# Patient Record
Sex: Female | Born: 1975 | Race: Black or African American | Hispanic: No | Smoking: Current some day smoker
Health system: Southern US, Community
[De-identification: ages and names within clinical notes are randomized; demographics above are authoritative.]

## PROBLEM LIST (undated history)

## (undated) DIAGNOSIS — F32A Depression, unspecified: Secondary | ICD-10-CM

## (undated) DIAGNOSIS — T8859XA Other complications of anesthesia, initial encounter: Secondary | ICD-10-CM

## (undated) DIAGNOSIS — F419 Anxiety disorder, unspecified: Secondary | ICD-10-CM

## (undated) DIAGNOSIS — R112 Nausea with vomiting, unspecified: Secondary | ICD-10-CM

## (undated) DIAGNOSIS — R51 Headache: Secondary | ICD-10-CM

## (undated) DIAGNOSIS — F329 Major depressive disorder, single episode, unspecified: Secondary | ICD-10-CM

## (undated) DIAGNOSIS — T4145XA Adverse effect of unspecified anesthetic, initial encounter: Secondary | ICD-10-CM

## (undated) DIAGNOSIS — Z9889 Other specified postprocedural states: Secondary | ICD-10-CM

## (undated) DIAGNOSIS — R519 Headache, unspecified: Secondary | ICD-10-CM

## (undated) DIAGNOSIS — Z8489 Family history of other specified conditions: Secondary | ICD-10-CM

## (undated) DIAGNOSIS — D649 Anemia, unspecified: Secondary | ICD-10-CM

## (undated) HISTORY — PX: OTHER SURGICAL HISTORY: SHX169

## (undated) HISTORY — PX: HERNIA REPAIR: SHX51

## (undated) HISTORY — PX: DILATION AND CURETTAGE OF UTERUS: SHX78

---

## 1999-02-11 ENCOUNTER — Emergency Department (HOSPITAL_COMMUNITY): Admission: EM | Admit: 1999-02-11 | Discharge: 1999-02-11 | Payer: Self-pay | Admitting: Emergency Medicine

## 1999-11-25 ENCOUNTER — Other Ambulatory Visit: Admission: RE | Admit: 1999-11-25 | Discharge: 1999-11-25 | Payer: Self-pay | Admitting: Obstetrics and Gynecology

## 2000-12-23 ENCOUNTER — Emergency Department (HOSPITAL_COMMUNITY): Admission: EM | Admit: 2000-12-23 | Discharge: 2000-12-23 | Payer: Self-pay

## 2001-01-25 ENCOUNTER — Encounter: Admission: RE | Admit: 2001-01-25 | Discharge: 2001-01-25 | Payer: Self-pay | Admitting: Obstetrics & Gynecology

## 2001-02-07 ENCOUNTER — Encounter: Payer: Self-pay | Admitting: Obstetrics & Gynecology

## 2001-02-07 ENCOUNTER — Ambulatory Visit (HOSPITAL_COMMUNITY): Admission: RE | Admit: 2001-02-07 | Discharge: 2001-02-07 | Payer: Self-pay | Admitting: Obstetrics & Gynecology

## 2001-03-08 ENCOUNTER — Encounter: Admission: RE | Admit: 2001-03-08 | Discharge: 2001-03-08 | Payer: Self-pay | Admitting: Obstetrics & Gynecology

## 2001-06-20 ENCOUNTER — Emergency Department (HOSPITAL_COMMUNITY): Admission: EM | Admit: 2001-06-20 | Discharge: 2001-06-20 | Payer: Self-pay | Admitting: Emergency Medicine

## 2001-06-24 ENCOUNTER — Emergency Department (HOSPITAL_COMMUNITY): Admission: EM | Admit: 2001-06-24 | Discharge: 2001-06-24 | Payer: Self-pay | Admitting: Emergency Medicine

## 2002-10-20 ENCOUNTER — Other Ambulatory Visit: Admission: RE | Admit: 2002-10-20 | Discharge: 2002-10-20 | Payer: Self-pay | Admitting: *Deleted

## 2004-01-30 ENCOUNTER — Encounter: Admission: RE | Admit: 2004-01-30 | Discharge: 2004-01-30 | Payer: Self-pay | Admitting: Internal Medicine

## 2005-04-10 ENCOUNTER — Emergency Department (HOSPITAL_COMMUNITY): Admission: EM | Admit: 2005-04-10 | Discharge: 2005-04-10 | Payer: Self-pay | Admitting: Emergency Medicine

## 2005-04-14 ENCOUNTER — Emergency Department (HOSPITAL_COMMUNITY): Admission: EM | Admit: 2005-04-14 | Discharge: 2005-04-14 | Payer: Self-pay | Admitting: Family Medicine

## 2005-06-10 ENCOUNTER — Emergency Department (HOSPITAL_COMMUNITY): Admission: EM | Admit: 2005-06-10 | Discharge: 2005-06-11 | Payer: Self-pay | Admitting: Emergency Medicine

## 2005-07-08 ENCOUNTER — Ambulatory Visit (HOSPITAL_COMMUNITY): Admission: RE | Admit: 2005-07-08 | Discharge: 2005-07-08 | Payer: Self-pay | Admitting: Specialist

## 2006-11-28 ENCOUNTER — Emergency Department (HOSPITAL_COMMUNITY): Admission: EM | Admit: 2006-11-28 | Discharge: 2006-11-29 | Payer: Self-pay | Admitting: Emergency Medicine

## 2007-10-28 ENCOUNTER — Emergency Department (HOSPITAL_COMMUNITY): Admission: EM | Admit: 2007-10-28 | Discharge: 2007-10-28 | Payer: Self-pay | Admitting: Emergency Medicine

## 2008-01-10 ENCOUNTER — Ambulatory Visit: Payer: Self-pay | Admitting: Family Medicine

## 2008-01-10 LAB — CONVERTED CEMR LAB: Microalb, Ur: 0.35 mg/dL (ref 0.00–1.89)

## 2008-01-13 ENCOUNTER — Ambulatory Visit: Payer: Self-pay | Admitting: Internal Medicine

## 2008-01-16 ENCOUNTER — Ambulatory Visit (HOSPITAL_COMMUNITY): Admission: RE | Admit: 2008-01-16 | Discharge: 2008-01-16 | Payer: Self-pay | Admitting: Family Medicine

## 2008-01-18 ENCOUNTER — Emergency Department (HOSPITAL_COMMUNITY): Admission: EM | Admit: 2008-01-18 | Discharge: 2008-01-18 | Payer: Self-pay | Admitting: Family Medicine

## 2008-01-25 ENCOUNTER — Ambulatory Visit: Payer: Self-pay | Admitting: *Deleted

## 2008-02-28 ENCOUNTER — Encounter: Payer: Self-pay | Admitting: Internal Medicine

## 2008-02-28 ENCOUNTER — Ambulatory Visit: Payer: Self-pay | Admitting: Internal Medicine

## 2008-03-13 ENCOUNTER — Ambulatory Visit: Payer: Self-pay | Admitting: Internal Medicine

## 2008-03-13 ENCOUNTER — Encounter (INDEPENDENT_AMBULATORY_CARE_PROVIDER_SITE_OTHER): Payer: Self-pay | Admitting: Family Medicine

## 2008-03-13 LAB — CONVERTED CEMR LAB
AST: 13 units/L (ref 0–37)
Albumin: 4.2 g/dL (ref 3.5–5.2)
BUN: 14 mg/dL (ref 6–23)
Calcium: 9 mg/dL (ref 8.4–10.5)
Chloride: 103 meq/L (ref 96–112)
Glucose, Bld: 282 mg/dL — ABNORMAL HIGH (ref 70–99)
Potassium: 3.6 meq/L (ref 3.5–5.3)
TSH: 2.227 microintl units/mL (ref 0.350–4.50)
Total CHOL/HDL Ratio: 3.9
Total Protein: 7.1 g/dL (ref 6.0–8.3)
Triglycerides: 205 mg/dL — ABNORMAL HIGH (ref <150)
VLDL: 41 mg/dL — ABNORMAL HIGH (ref 0–40)

## 2008-04-06 HISTORY — PX: OTHER SURGICAL HISTORY: SHX169

## 2008-05-31 ENCOUNTER — Ambulatory Visit (HOSPITAL_COMMUNITY): Admission: RE | Admit: 2008-05-31 | Discharge: 2008-05-31 | Payer: Self-pay | Admitting: Obstetrics and Gynecology

## 2008-06-28 ENCOUNTER — Ambulatory Visit (HOSPITAL_COMMUNITY): Admission: RE | Admit: 2008-06-28 | Discharge: 2008-06-28 | Payer: Self-pay | Admitting: Obstetrics and Gynecology

## 2008-07-13 ENCOUNTER — Ambulatory Visit: Payer: Self-pay | Admitting: Family Medicine

## 2008-07-13 LAB — CONVERTED CEMR LAB
ALT: 22 units/L (ref 0–35)
AST: 18 units/L (ref 0–37)
Albumin: 4.4 g/dL (ref 3.5–5.2)
Alkaline Phosphatase: 43 units/L (ref 39–117)
Basophils Absolute: 0 10*3/uL (ref 0.0–0.1)
Basophils Relative: 1 % (ref 0–1)
Chloride: 103 meq/L (ref 96–112)
Cholesterol: 173 mg/dL (ref 0–200)
Glucose, Bld: 104 mg/dL — ABNORMAL HIGH (ref 70–99)
HCT: 38.2 % (ref 36.0–46.0)
Hemoglobin: 11.4 g/dL — ABNORMAL LOW (ref 12.0–15.0)
MCHC: 29.8 g/dL — ABNORMAL LOW (ref 30.0–36.0)
Microalb, Ur: 0.5 mg/dL (ref 0.00–1.89)
Monocytes Absolute: 0.2 10*3/uL (ref 0.1–1.0)
RBC: 5.31 M/uL — ABNORMAL HIGH (ref 3.87–5.11)
RDW: 26.8 % — ABNORMAL HIGH (ref 11.5–15.5)
Total CHOL/HDL Ratio: 4
VLDL: 36 mg/dL (ref 0–40)
WBC: 5.7 10*3/uL (ref 4.0–10.5)

## 2008-09-08 ENCOUNTER — Emergency Department (HOSPITAL_COMMUNITY): Admission: EM | Admit: 2008-09-08 | Discharge: 2008-09-08 | Payer: Self-pay | Admitting: Emergency Medicine

## 2008-10-16 ENCOUNTER — Ambulatory Visit: Payer: Self-pay | Admitting: Family Medicine

## 2008-12-01 ENCOUNTER — Emergency Department (HOSPITAL_COMMUNITY): Admission: EM | Admit: 2008-12-01 | Discharge: 2008-12-02 | Payer: Self-pay | Admitting: Emergency Medicine

## 2009-01-17 ENCOUNTER — Ambulatory Visit: Payer: Self-pay | Admitting: Family Medicine

## 2009-06-16 ENCOUNTER — Emergency Department (HOSPITAL_COMMUNITY): Admission: EM | Admit: 2009-06-16 | Discharge: 2009-06-16 | Payer: Self-pay | Admitting: Emergency Medicine

## 2010-01-17 ENCOUNTER — Ambulatory Visit (HOSPITAL_COMMUNITY): Admission: RE | Admit: 2010-01-17 | Discharge: 2010-01-17 | Payer: Self-pay | Admitting: Obstetrics and Gynecology

## 2010-01-27 ENCOUNTER — Emergency Department (HOSPITAL_COMMUNITY): Admission: EM | Admit: 2010-01-27 | Discharge: 2010-01-27 | Payer: Self-pay | Admitting: Emergency Medicine

## 2010-06-18 LAB — CBC
Hemoglobin: 7.6 g/dL — ABNORMAL LOW (ref 12.0–15.0)
MCH: 18.2 pg — ABNORMAL LOW (ref 26.0–34.0)
Platelets: 225 10*3/uL (ref 150–400)
RBC: 4.18 MIL/uL (ref 3.87–5.11)

## 2010-06-18 LAB — DIFFERENTIAL
Basophils Relative: 1 % (ref 0–1)
Lymphocytes Relative: 42 % (ref 12–46)
Lymphs Abs: 1.6 10*3/uL (ref 0.7–4.0)
Neutro Abs: 1.9 10*3/uL (ref 1.7–7.7)
Neutrophils Relative %: 47 % (ref 43–77)

## 2010-06-18 LAB — BASIC METABOLIC PANEL
BUN: 10 mg/dL (ref 6–23)
Calcium: 9.1 mg/dL (ref 8.4–10.5)
Chloride: 109 mEq/L (ref 96–112)
Creatinine, Ser: 0.63 mg/dL (ref 0.4–1.2)
GFR calc Af Amer: >60 mL/min (ref >60)
GFR calc non Af Amer: >60 mL/min (ref >60)
Glucose, Bld: 132 mg/dL — ABNORMAL HIGH (ref 70–99)
Potassium: 3.6 mEq/L (ref 3.5–5.1)
Sodium: 140 mEq/L (ref 135–145)

## 2010-06-19 LAB — CBC
Hemoglobin: 6.6 g/dL — CL (ref 12.0–15.0)
MCHC: 28.8 g/dL — ABNORMAL LOW (ref 30.0–36.0)
MCV: 65 fL — ABNORMAL LOW (ref 78.0–100.0)

## 2010-06-19 LAB — TYPE AND SCREEN
ABO/RH(D): O POS
Antibody Screen: NEGATIVE

## 2010-06-19 LAB — BASIC METABOLIC PANEL
BUN: 6 mg/dL (ref 6–23)
Calcium: 9.1 mg/dL (ref 8.4–10.5)
Chloride: 106 mEq/L (ref 96–112)
Creatinine, Ser: 0.66 mg/dL (ref 0.4–1.2)
Glucose, Bld: 77 mg/dL (ref 70–99)
Potassium: 3.7 mEq/L (ref 3.5–5.1)

## 2010-06-19 LAB — HCG, SERUM, QUALITATIVE: Preg, Serum: NEGATIVE

## 2010-06-19 LAB — GLUCOSE, CAPILLARY: Glucose-Capillary: 94 mg/dL (ref 70–99)

## 2010-06-29 LAB — GLUCOSE, CAPILLARY: Glucose-Capillary: 140 mg/dL — ABNORMAL HIGH (ref 70–99)

## 2010-07-12 LAB — GLUCOSE, CAPILLARY: Glucose-Capillary: 250 mg/dL — ABNORMAL HIGH (ref 70–99)

## 2010-07-17 LAB — BASIC METABOLIC PANEL
CO2: 23 mEq/L (ref 19–32)
Calcium: 8.9 mg/dL (ref 8.4–10.5)
Chloride: 103 mEq/L (ref 96–112)
GFR calc Af Amer: >60 mL/min (ref >60)
GFR calc non Af Amer: >60 mL/min (ref >60)
Glucose, Bld: 170 mg/dL — ABNORMAL HIGH (ref 70–99)
Potassium: 3.7 mEq/L (ref 3.5–5.1)
Sodium: 133 mEq/L — ABNORMAL LOW (ref 135–145)

## 2010-07-17 LAB — CBC
Hemoglobin: 10.8 g/dL — ABNORMAL LOW (ref 12.0–15.0)
MCHC: 29.7 g/dL — ABNORMAL LOW (ref 30.0–36.0)
RBC: 5.17 MIL/uL — ABNORMAL HIGH (ref 3.87–5.11)

## 2010-07-17 LAB — GLUCOSE, CAPILLARY

## 2010-07-17 LAB — HCG, SERUM, QUALITATIVE: Preg, Serum: NEGATIVE

## 2010-07-22 LAB — BASIC METABOLIC PANEL
BUN: 6 mg/dL (ref 6–23)
Calcium: 9.3 mg/dL (ref 8.4–10.5)
GFR calc non Af Amer: >60 mL/min (ref >60)
Glucose, Bld: 129 mg/dL — ABNORMAL HIGH (ref 70–99)
Potassium: 3.8 mEq/L (ref 3.5–5.1)

## 2010-07-22 LAB — CBC
HCT: 26.7 % — ABNORMAL LOW (ref 36.0–46.0)
MCHC: 29.9 g/dL — ABNORMAL LOW (ref 30.0–36.0)
Platelets: 243 10*3/uL (ref 150–400)
RDW: 21.7 % — ABNORMAL HIGH (ref 11.5–15.5)
WBC: 6.7 10*3/uL (ref 4.0–10.5)

## 2010-08-19 NOTE — H&P (Signed)
NAME:  Amy Lowe, Amy Lowe                ACCOUNT NO.:  [id number]   MEDICAL RECORD NO.:  [id number]          PATIENT TYPE:  AMB   LOCATION:  SDC                           FACILITY:  [facility]   PHYSICIAN:  Juluis Mire, M.D.   DATE OF BIRTH:  [birth year]   DATE OF ADMISSION:  06/28/2008  DATE OF DISCHARGE:                              HISTORY & PHYSICAL   The patient is a 35 year old nulligravida female scheduled to undergo  laparoscopic evaluation.   In relation to present admission, the patient had been seen in  [facility] here in [place].  She had an ultrasound that revealed  bilateral ovarian cyst.  We saw her back in the office.  She continued  to have bilateral cyst on followup ultrasound.  Right ovary had a 5-cm  cyst.  Left ovary had a 3.6 and 4-cm cyst.  These are persisting.  Her  past history is significant and that she is known to have significant  pelvic adhesions with bilateral hydrosalpinx.  She had a previous  laparoscopic evaluation with Dr. Malachy Mood.  Therefore, we are proceeding  with laparoscopic evaluation.  Again, she wants to maintain the ability  to conceive despite the known pelvic adhesions.   In terms of allergies, the patient is allergic to PENICILLIN.   MEDICATIONS:  She is on metformin and another hypoglycemic.   PAST MEDICAL HISTORY:  She does have a history of glucose intolerance,  is on medications as noted.  She does have a history of anemia.  We have  been working with her iron sulfate supplementation.  She has a past  history of STDs including gonorrhea and chlamydia.  Has a history of  migraines.  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   SURGICAL HISTORY:  She has had 3 D&C.  She has had 4 laparoscopies for  adhesions.  She had a hernia repair at age 55.   SOCIAL HISTORY:  One pack per day tobacco use.  Occasional alcohol use.   FAMILY HISTORY:  A history of diabetes is the main thing.   REVIEW OF SYSTEMS:  Noncontributory.   PHYSICAL EXAMINATION:   VITAL SIGNS:  The patient is afebrile with stable  vital signs.  HEENT:  The patient is normocephalic.  Pupils equal, round, and reactive  to light and accommodation.  Extraocular movements were intact.  Sclerae  and conjunctivae clear.  Oropharynx clear.  NECK:  Without thyromegaly.  BREASTS:  Not examined.  LUNGS:  Clear.  CARDIOVASCULAR:  Regular rate without murmurs or gallops.  ABDOMEN:  Benign.  No mass, organomegaly, or tenderness.  PELVIC:  Normal external genitalia.  Vaginal mucosa is clear.  Cervix  unremarkable.  Uterus normal size and shape, somewhat fixed.  Adnexa  also fixed.  EXTREMITIES:  Trace edema.  NEUROLOGIC:  Grossly within normal limits.   IMPRESSION:  1. Bilateral ovarian cyst with known extensive pelvic adhesions.  2. Glucose intolerance.   PLAN:  At the present time, we will attempt laparoscopic evaluation and  evaluation of the bilateral ovarian cyst.  The patient states that we  cannot see  them laparoscopically, she wishes [place] to go in and remove the  cyst, but not the ovaries, despite the known adhesions process she has.  The nature of the surgery has been discussed including the risk of  infection.  The risk of hemorrhage that could require transfusion with  the risk of AIDS or hepatitis.  The risk of injury to adjacent organs  including bladder, bowel, ureters that could require further exploratory  surgery.  Risk of deep venous thrombosis and pulmonary embolus.  The  patient expressed understanding of potential risks and complications and  alternatives.      Juluis Mire, M.D.  Electronically Signed     JSM/MEDQ  D:  06/28/2008  T:  06/28/2008  Job:  [id number]

## 2010-08-19 NOTE — Op Note (Signed)
NAME:  Amy Lowe, Amy Lowe                ACCOUNT NO.:  [id number]   MEDICAL RECORD NO.:  [id number]          PATIENT TYPE:  AMB   LOCATION:  SDC                           FACILITY:  [facility]   PHYSICIAN:  Juluis Mire, M.D.   DATE OF BIRTH:  [birth year]   DATE OF PROCEDURE:  06/28/2008  DATE OF DISCHARGE:                               OPERATIVE REPORT   PREOPERATIVE DIAGNOSIS:  Bilateral ovarian cyst.   POSTOPERATIVE DIAGNOSES:  1. Pelvic adhesions.  2. Bilateral hydrosalpinx.   PROCEDURES:  1. Open laparoscopy.  2. Lysis of adhesions.   SURGEON:  Juluis Mire, MD   ANESTHESIA:  General endotracheal.   ESTIMATED BLOOD LOSS:  Minimal.   PACKS AND DRAINS:  None.   INTRAOPERATIVE BLOOD PLACED:  None.   COMPLICATIONS:  None.   INDICATIONS:  Dictated in the history and physical.   PROCEDURE:  As follows.  The patient taken to the OR and placed in  supine position.  After satisfactory level of general endotracheal  anesthesia was obtained, the abdomen was prepped out with an antiseptic  solution.  The vaginal area was also prepped out.  Bladder was emptied  by in-and-out catheterization.  A Hulka tenaculum was put in place and  secured.  The patient draped in sterile field.  Subumbilical incision  made with a knife and carried through subcutaneous tissue.  Fascia was  entered sharp and incision fashioned laterally.  Peritoneum was  identified and entered sharply.  Open laparoscopic trocar was put in  place and secured.  Abdomen was insufflated with carbon dioxide.  Laparoscope was introduced.  There was no evidence of injury to adjacent  organs.  She did have some omental adhesions above the umbilicus.  A 5-  mm trocar was put in place in the suprapubic area.  Uterus would not  elevate well.  She did have bilateral hydrosalpinx draping over the  ovaries.  What we could see if the ovaries looks like, they were not  ovarian cysts, but really what we were seeing was the hydrosalpinx  on  ultrasound.  The anterior part of the uterus was free and really the cul-  de-sac was relatively free except for the tubes draping down the pelvic  sidewall.  At this point in time, we put a 5-mm trocar in the left and  right lower quadrant.  After visualization of epigastric vessels, we  elevated the uterus.  We began trying to free up the right tube, we were  able to free it up somewhat from the back to the uterus as we got more  posteriorly it was densely adherent to the colon, we were little worried  about colonic injury.  Therefore, we desisted at that point in time.  The left side was completely stuck to the left pelvic sidewall.  The  ureter could easily come into play and it did not look like it is going  to make a huge amount of difference in terms of tubal function and again  then appeared to be ovarian cyst just hydrosalpinx.  Therefore at this  point in  time, we decided to discontinue the procedure.  We thoroughly  irrigated and made sure there was no active bleeding which were none.  Once the abdomen was de-inflated with carbon dioxide, all trocars were  removed, subumbilical fascia closed with figure-of-eight of 0-Vicryl.  Suprapubic incisions were closed with Dermabond.  The patient taken out  of dorsosupine position, once extubated and alert, transferred to  recovery room in good condition.  Sponge, instrument, and needle count  was reported as correct by circulating nurse.      Juluis Mire, M.D.  Electronically Signed     JSM/MEDQ  D:  06/28/2008  T:  06/28/2008  Job:  [id number]

## 2010-09-01 ENCOUNTER — Emergency Department (HOSPITAL_COMMUNITY)
Admission: EM | Admit: 2010-09-01 | Discharge: 2010-09-02 | Disposition: A | Discharge status: Home / Self Care | Payer: Self-pay | Attending: Emergency Medicine | Admitting: Emergency Medicine

## 2010-09-01 DIAGNOSIS — E119 Type 2 diabetes mellitus without complications: Secondary | ICD-10-CM | POA: Insufficient documentation

## 2010-09-01 DIAGNOSIS — D649 Anemia, unspecified: Secondary | ICD-10-CM | POA: Insufficient documentation

## 2010-09-01 LAB — GLUCOSE, CAPILLARY: Glucose-Capillary: 429 mg/dL — ABNORMAL HIGH (ref 70–99)

## 2010-09-02 LAB — DIFFERENTIAL
Basophils Relative: 1 % (ref 0–1)
Eosinophils Relative: 1 % (ref 0–5)
Lymphocytes Relative: 37 % (ref 12–46)
Monocytes Relative: 6 % (ref 3–12)
Neutrophils Relative %: 55 % (ref 43–77)

## 2010-09-02 LAB — COMPREHENSIVE METABOLIC PANEL
ALT: 14 U/L (ref 0–35)
Alkaline Phosphatase: 77 U/L (ref 39–117)
CO2: 24 mEq/L (ref 19–32)
Chloride: 96 mEq/L (ref 96–112)
Glucose, Bld: 397 mg/dL — ABNORMAL HIGH (ref 70–99)
Potassium: 3.9 mEq/L (ref 3.5–5.1)
Sodium: 130 mEq/L — ABNORMAL LOW (ref 135–145)
Total Bilirubin: 0.2 mg/dL — ABNORMAL LOW (ref 0.3–1.2)
Total Protein: 7.3 g/dL (ref 6.0–8.3)

## 2010-09-02 LAB — BLOOD GAS, VENOUS
Acid-base deficit: 1.7 mmol/L (ref 0.0–2.0)
Bicarbonate: 21.8 mEq/L (ref 20.0–24.0)
TCO2: 20.7 mmol/L (ref 0–100)
pCO2, Ven: 33.6 mmHg — ABNORMAL LOW (ref 45.0–50.0)
pH, Ven: 7.427 — ABNORMAL HIGH (ref 7.250–7.300)

## 2010-09-02 LAB — CBC
HCT: 29.4 % — ABNORMAL LOW (ref 36.0–46.0)
Hemoglobin: 8 g/dL — ABNORMAL LOW (ref 12.0–15.0)
MCV: 57.1 fL — ABNORMAL LOW (ref 78.0–100.0)
RBC: 5.15 MIL/uL — ABNORMAL HIGH (ref 3.87–5.11)
RDW: 19.2 % — ABNORMAL HIGH (ref 11.5–15.5)
WBC: 6.1 10*3/uL (ref 4.0–10.5)

## 2010-09-02 LAB — URINALYSIS, ROUTINE W REFLEX MICROSCOPIC
Bilirubin Urine: NEGATIVE
Hgb urine dipstick: NEGATIVE
Ketones, ur: NEGATIVE mg/dL
Specific Gravity, Urine: 1.038 — ABNORMAL HIGH (ref 1.005–1.030)
Urobilinogen, UA: 0.2 mg/dL (ref 0.0–1.0)

## 2010-09-02 LAB — URINE MICROSCOPIC-ADD ON

## 2010-09-02 LAB — GLUCOSE, CAPILLARY: Glucose-Capillary: 335 mg/dL — ABNORMAL HIGH (ref 70–99)

## 2011-01-02 LAB — POCT I-STAT, CHEM 8
BUN: 11
Calcium, Ion: 1.18
Chloride: 100
Creatinine, Ser: 0.8
Glucose, Bld: 440 — ABNORMAL HIGH
HCT: 36
Hemoglobin: 10.9 — ABNORMAL LOW
Hemoglobin: 12.2
Potassium: 6 — ABNORMAL HIGH
Sodium: 133 — ABNORMAL LOW
TCO2: 25

## 2011-01-02 LAB — CBC
HCT: 31.8 — ABNORMAL LOW
MCHC: 29.1 — ABNORMAL LOW
MCV: 57.4 — ABNORMAL LOW
RBC: 5.55 — ABNORMAL HIGH
WBC: 5.6

## 2011-01-02 LAB — POCT PREGNANCY, URINE
Operator id: 264421
Preg Test, Ur: NEGATIVE

## 2011-01-02 LAB — URINALYSIS, ROUTINE W REFLEX MICROSCOPIC
Bilirubin Urine: NEGATIVE
Glucose, UA: >1000 — AB
Ketones, ur: NEGATIVE
Specific Gravity, Urine: 1.04 — ABNORMAL HIGH
pH: 5

## 2011-01-02 LAB — DIFFERENTIAL
Basophils Relative: 1
Eosinophils Relative: 1
Lymphs Abs: 2.1
Monocytes Absolute: 0.3
Monocytes Relative: 6
Neutro Abs: 3

## 2011-01-02 LAB — GLUCOSE, CAPILLARY: Glucose-Capillary: 317 — ABNORMAL HIGH

## 2011-01-02 LAB — URINE MICROSCOPIC-ADD ON

## 2011-01-16 LAB — I-STAT 8, (EC8 V) (CONVERTED LAB)
Acid-Base Excess: 1
Bicarbonate: 26.2 — ABNORMAL HIGH
HCT: 45
Hemoglobin: 15.3 — ABNORMAL HIGH
Operator id: 235561
Sodium: 129 — ABNORMAL LOW
TCO2: 27
pCO2, Ven: 43.2 — ABNORMAL LOW

## 2011-01-16 LAB — POCT PREGNANCY, URINE
Operator id: 235561
Preg Test, Ur: NEGATIVE

## 2011-01-16 LAB — WET PREP, GENITAL
Trich, Wet Prep: NONE SEEN
WBC, Wet Prep HPF POC: NONE SEEN
Yeast Wet Prep HPF POC: NONE SEEN

## 2011-01-16 LAB — POCT URINALYSIS DIP (DEVICE)
Bilirubin Urine: NEGATIVE
Hgb urine dipstick: NEGATIVE
Ketones, ur: NEGATIVE
Protein, ur: NEGATIVE
pH: 5

## 2011-05-28 ENCOUNTER — Encounter (HOSPITAL_COMMUNITY): Payer: Self-pay | Admitting: Emergency Medicine

## 2011-05-28 ENCOUNTER — Emergency Department (HOSPITAL_COMMUNITY)
Admission: EM | Admit: 2011-05-28 | Discharge: 2011-05-28 | Disposition: A | Discharge status: Home / Self Care | Payer: Self-pay | Attending: Emergency Medicine | Admitting: Emergency Medicine

## 2011-05-28 DIAGNOSIS — F17200 Nicotine dependence, unspecified, uncomplicated: Secondary | ICD-10-CM | POA: Insufficient documentation

## 2011-05-28 DIAGNOSIS — J329 Chronic sinusitis, unspecified: Secondary | ICD-10-CM | POA: Insufficient documentation

## 2011-05-28 DIAGNOSIS — R739 Hyperglycemia, unspecified: Secondary | ICD-10-CM

## 2011-05-28 DIAGNOSIS — R42 Dizziness and giddiness: Secondary | ICD-10-CM | POA: Insufficient documentation

## 2011-05-28 DIAGNOSIS — E1169 Type 2 diabetes mellitus with other specified complication: Secondary | ICD-10-CM | POA: Insufficient documentation

## 2011-05-28 HISTORY — DX: Anemia, unspecified: D64.9

## 2011-05-28 LAB — GLUCOSE, CAPILLARY: Glucose-Capillary: 192 mg/dL — ABNORMAL HIGH (ref 70–99)

## 2011-05-28 MED ORDER — GLIPIZIDE ER 10 MG PO TB24: 10.0000 mg | ORAL_TABLET | Freq: Every day | ORAL | Status: DC

## 2011-05-28 MED ORDER — TRAMADOL-ACETAMINOPHEN 37.5-325 MG PO TABS: ORAL_TABLET | ORAL | Status: AC

## 2011-05-28 MED ORDER — SULFAMETHOXAZOLE-TRIMETHOPRIM 400-80 MG PO TABS: 1.0000 | ORAL_TABLET | Freq: Two times a day (BID) | ORAL | Status: AC

## 2011-05-28 NOTE — ED Provider Notes (Signed)
History     CSN: [id number]  Arrival date & time 05/28/11  2130   First MD Initiated Contact with Patient 05/28/11 1940      Chief Complaint  Patient presents with  . Headache  . Sore Throat  . Dizziness  . Hyperglycemia    (Consider location/radiation/quality/duration/timing/severity/associated sxs/prior treatment) HPI  Patient relates she's had a headache behind her eyes for the past several weeks. She also states it's a pressure feeling. She also describes nasal congestion that is clear or yellow when she blows her nose. She states she also has a cough that feels like she is clear throat is yellow or green. She denies fever, vomiting, diarrhea. She states she has nausea in her throat hurts when she coughs.  Patient has diabetes and does not have a regular doctor. She states she was on medications but she doesn't take it every day.  PCP none  Past Medical History  Diagnosis Date  . Diabetes mellitus   . Anemia     Past Surgical History  Procedure Date  . Abdominal laparascopy   . Dilation and curettage of uterus   . Hernia repair     History reviewed. No pertinent family history.  History  Substance Use Topics  . Smoking status: Current Some Day Smoker  . Smokeless tobacco: Not on file  . Alcohol Use: No   employed  OB History    Grav Para Term Preterm Abortions TAB SAB Ect Mult Living                  Review of Systems  All other systems reviewed and are negative.    Allergies  Penicillins  Home Medications   Current Outpatient Rx  Name Route Sig Dispense Refill  . DIPHENHYDRAMINE HCL 25 MG PO TABS Oral Take 25 mg by mouth every 6 (six) hours as needed. For allergy symptom relief    . GLIPIZIDE ER 10 MG PO TB24 Oral Take 10 mg by mouth daily.    . GUAIFENESIN ER 600 MG PO TB12 Oral Take 1,200 mg by mouth 2 (two) times daily as needed. For productive cough    . IBUPROFEN 200 MG PO TABS Oral Take 200 mg by mouth every 6 (six) hours as needed. For  pain relief    . INSULIN REGULAR HUMAN 100 UNIT/ML IJ SOLN Subcutaneous Inject 10 Units into the skin as needed. For insulin level stability      BP 157/69  Pulse 94  Temp(Src) 99 F (37.2 C) (Oral)  Resp 18  SpO2 100%  LMP 05/15/2011  Vital signs normal    Physical Exam  Nursing note and vitals reviewed. Constitutional: She is oriented to person, place, and time. She appears well-developed and well-nourished.  Non-toxic appearance. She does not appear ill. No distress.  HENT:  Head: Normocephalic and atraumatic.  Right Ear: External ear normal.  Left Ear: External ear normal.  Nose: Nose normal. No mucosal edema or rhinorrhea.  Mouth/Throat: Oropharynx is clear and moist and mucous membranes are normal. No dental abscesses or uvula swelling.       Patient has some bogginess of her nasal mucosa. Her TMs are normal bilaterally. Patient has some tenderness over her frontal sinuses  Eyes: Conjunctivae and EOM are normal. Pupils are equal, round, and reactive to light.  Neck: Normal range of motion and full passive range of motion without pain. Neck supple.  Cardiovascular: Normal rate, regular rhythm and normal heart sounds.  Exam reveals no gallop  and no friction rub.   No murmur heard. Pulmonary/Chest: Effort normal and breath sounds normal. No respiratory distress. She has no wheezes. She has no rhonchi. She has no rales. She exhibits no tenderness and no crepitus.  Abdominal: Soft. Normal appearance and bowel sounds are normal. She exhibits no distension. There is no tenderness. There is no rebound and no guarding.  Musculoskeletal: Normal range of motion. She exhibits no edema and no tenderness.       Moves all extremities well.   Neurological: She is alert and oriented to person, place, and time. She has normal strength. No cranial nerve deficit.  Skin: Skin is warm, dry and intact. No rash noted. No erythema. No pallor.  Psychiatric: She has a normal mood and affect. Her speech  is normal and behavior is normal. Her mood appears not anxious.    ED Course  Procedures (including critical care time)  Pt has an area on her 4th toe of her left foot where her little toe rubs. It is not infected and appears to be a corn. Discussed care.   Labs Reviewed  GLUCOSE, CAPILLARY - Abnormal; Notable for the following:    Glucose-Capillary 192 (*)    All other components within normal limits   Laboratory interpretation all normal except hyperglycemia   Diagnoses that have been ruled out:  None  Diagnoses that are still under consideration:  None  Final diagnoses:  Sinusitis  Hyperglycemia   New Prescriptions   GLIPIZIDE (GLUCOTROL XL) 10 MG 24 HR TABLET    Take 1 tablet (10 mg total) by mouth daily.   SULFAMETHOXAZOLE-TRIMETHOPRIM (SEPTRA) 400-80 MG PER TABLET    Take 1 tablet by mouth 2 (two) times daily.   TRAMADOL-ACETAMINOPHEN (ULTRACET) 37.5-325 MG PER TABLET    2 tabs po QID prn pain   Plan discharge Devoria Albe, MD, Armando Gang     MDM          Ward Givens, MD 05/28/11 2130

## 2011-05-28 NOTE — Progress Notes (Signed)
ED CM contacted by ED Triage RN, Consuella Lose to speak with pt about resources to assist with DM medical care.  Pt and husband are not insured. Pt is working. Has been to DSS for assistance who has offered to assist her if she gets a Rx from a MD for Test strips. Cm referred pt to EDP to inquire if this is an option for her.  Has tried [facility] but can not afford the $50 copay to be seen. Discussed with her that presently there is not a facility that will not charge for the services she is requesting.  Has been DM II since 2006.  Pt already familiar with [facility], [facility], [facility] discounted insulin and glucometer.  Pt and husband offered written information for needymeds.com to get patient assistance programs, local self pay pcps, Cyt[employer] for internet access and financial resources.   Pt encouraged to continue to request assistance from DSS or [facility] (Reports both told her income too high from 2012) Pt reports her present s/s has probably caused her blood sugar to be elevated.  She is aware that any infection and illnesses may cause elevation.  Pt/husband appreciative of services and resources offered.

## 2011-05-28 NOTE — ED Notes (Signed)
Pt reports history of feeling dizzy even at rest. Pt also reporting maxillary sinus pressure, PND and frontal headache.  Pt also reports headache to back of head. Pt reports sinus pressure started several weeks ago. Pt reports history of seasonal allergies.  Pt also states she feels dehydrated. Pt has been using benadryl at home to help with secretions.  Pt also tried to use mucinex as well. Pt feels her nose is swollen, making it harder to breath. Pt denies N/V.  Pt denies history of syncope.

## 2011-05-28 NOTE — Discharge Instructions (Signed)
Look at the diabetic diet and try to follow it. Take the antibiotic until gone. You can take ibuprofen 600 mg 4 times a day for pain. Try heat on your face such as soaking a washcloth in warm water and wring it  out and laying on your face for comfort Look at the resource guide to try to get a local physician.  RESOURCE GUIDE   No Primary Care Doctor Call [facility]  [phone] Other agencies that provide inexpensive medical care    [facility]  [phone]    [facility]  [phone]    [facility]  [phone]    [facility]  [phone]    [facility]  [phone]    [facility]  [phone]

## 2011-05-28 NOTE — ED Notes (Signed)
Pt presented to the ER with c/o HA, 7/10, sore throat and dizziness couple of weeks ago however state that s/s worsen in the last couple of days ago. Pt also reports Hx of DM, since 2006.

## 2011-10-14 ENCOUNTER — Encounter (HOSPITAL_COMMUNITY): Payer: Self-pay | Admitting: *Deleted

## 2011-10-14 ENCOUNTER — Emergency Department (HOSPITAL_COMMUNITY)
Admission: EM | Admit: 2011-10-14 | Discharge: 2011-10-15 | Disposition: A | Discharge status: Home / Self Care | Payer: Self-pay | Attending: Emergency Medicine | Admitting: Emergency Medicine

## 2011-10-14 DIAGNOSIS — K089 Disorder of teeth and supporting structures, unspecified: Secondary | ICD-10-CM | POA: Insufficient documentation

## 2011-10-14 DIAGNOSIS — K047 Periapical abscess without sinus: Secondary | ICD-10-CM | POA: Insufficient documentation

## 2011-10-14 NOTE — ED Notes (Signed)
Pt in c/o dental abscess x3 days, pain increased tonight

## 2011-10-15 MED ORDER — CLINDAMYCIN HCL 150 MG PO CAPS: ORAL_CAPSULE | ORAL | Status: DC

## 2011-10-15 MED ORDER — OXYCODONE-ACETAMINOPHEN 5-325 MG PO TABS
1.0000 | ORAL_TABLET | Freq: Once | ORAL | Status: AC
  Administered 2011-10-15: 1 via ORAL
  Filled 2011-10-15: qty 1

## 2011-10-15 MED ORDER — HYDROCODONE-ACETAMINOPHEN 5-325 MG PO TABS: 1.0000 | ORAL_TABLET | Freq: Four times a day (QID) | ORAL | Status: AC | PRN

## 2011-10-15 MED ORDER — IBUPROFEN 800 MG PO TABS: 800.0000 mg | ORAL_TABLET | Freq: Three times a day (TID) | ORAL | Status: AC | PRN

## 2011-10-15 MED ORDER — KETOROLAC TROMETHAMINE 60 MG/2ML IM SOLN
60.0000 mg | Freq: Once | INTRAMUSCULAR | Status: AC
  Administered 2011-10-15: 60 mg via INTRAMUSCULAR
  Filled 2011-10-15: qty 2

## 2011-10-15 MED ORDER — CLINDAMYCIN HCL 300 MG PO CAPS
300.0000 mg | ORAL_CAPSULE | Freq: Once | ORAL | Status: AC
  Administered 2011-10-15: 300 mg via ORAL
  Filled 2011-10-15: qty 1

## 2011-10-20 NOTE — ED Provider Notes (Signed)
History     CSN: [id number]  Arrival date & time 10/14/11  2213   First MD Initiated Contact with Patient 10/15/11 0054      Chief Complaint  Patient presents with  . Dental Pain    (Consider location/radiation/quality/duration/timing/severity/associated sxs/prior treatment) HPI The patient presents to the ER with dental pain and questionable abscess. The patient has swelling to the gum line over the R upper 1st molar. The patient denies SOB, weakness, nausea, vomiting, neck swelling, or dizziness. The patient denies any treatment prior to arrival. Past Medical History  Diagnosis Date  . Diabetes mellitus   . Anemia     Past Surgical History  Procedure Date  . Abdominal laparascopy   . Dilation and curettage of uterus   . Hernia repair     History reviewed. No pertinent family history.  History  Substance Use Topics  . Smoking status: Current Some Day Smoker  . Smokeless tobacco: Not on file  . Alcohol Use: No    OB History    Grav Para Term Preterm Abortions TAB SAB Ect Mult Living                  Review of Systems All other systems negative except as documented in the HPI. All pertinent positives and negatives as reviewed in the HPI.  Allergies  Penicillins  Home Medications   Current Outpatient Rx  Name Route Sig Dispense Refill  . DIPHENHYDRAMINE HCL 25 MG PO TABS Oral Take 25 mg by mouth every 6 (six) hours as needed. For allergy symptom relief    . IBUPROFEN 200 MG PO TABS Oral Take 200 mg by mouth every 6 (six) hours as needed. For pain relief    . INSULIN REGULAR HUMAN 100 UNIT/ML IJ SOLN Subcutaneous Inject 10 Units into the skin as needed. For insulin level stability    . CLINDAMYCIN HCL 150 MG PO CAPS  2 PO TID 42 capsule 0  . HYDROCODONE-ACETAMINOPHEN 5-325 MG PO TABS Oral Take 1 tablet by mouth every 6 (six) hours as needed for pain. 15 tablet 0  . IBUPROFEN 800 MG PO TABS Oral Take 1 tablet (800 mg total) by mouth every 8 (eight) hours as  needed for pain. 21 tablet 0    BP 128/52  Pulse 86  Temp 100.1 F (37.8 C) (Oral)  Resp 16  SpO2 97%  Physical Exam  Nursing note and vitals reviewed. Constitutional: She appears well-developed and well-nourished.  HENT:  Head: Normocephalic and atraumatic.  Mouth/Throat:    Cardiovascular: Normal rate, regular rhythm and normal heart sounds.   Pulmonary/Chest: Effort normal and breath sounds normal.    ED Course  Procedures (including critical care time)  Labs Reviewed - No data to display No results found.   1. Dental abscess    Patient will be treated for any worsening in her condition. Referred to dentistry.    MDM          Carlyle Dolly, PA-C 10/20/11 0140

## 2011-10-21 NOTE — ED Provider Notes (Signed)
Medical screening examination/treatment/procedure(s) were performed by non-physician practitioner and as supervising physician I was immediately available for consultation/collaboration.   Gwyneth Sprout, MD 10/21/11 2225

## 2012-08-02 ENCOUNTER — Emergency Department (HOSPITAL_COMMUNITY): Payer: No Typology Code available for payment source

## 2012-08-02 ENCOUNTER — Encounter (HOSPITAL_COMMUNITY): Payer: Self-pay | Admitting: Emergency Medicine

## 2012-08-02 ENCOUNTER — Emergency Department (HOSPITAL_COMMUNITY)
Admission: EM | Admit: 2012-08-02 | Discharge: 2012-08-02 | Disposition: A | Discharge status: Home / Self Care | Payer: No Typology Code available for payment source | Attending: Emergency Medicine | Admitting: Emergency Medicine

## 2012-08-02 DIAGNOSIS — Z794 Long term (current) use of insulin: Secondary | ICD-10-CM | POA: Insufficient documentation

## 2012-08-02 DIAGNOSIS — F17200 Nicotine dependence, unspecified, uncomplicated: Secondary | ICD-10-CM | POA: Insufficient documentation

## 2012-08-02 DIAGNOSIS — S92911A Unspecified fracture of right toe(s), initial encounter for closed fracture: Secondary | ICD-10-CM

## 2012-08-02 DIAGNOSIS — S92919A Unspecified fracture of unspecified toe(s), initial encounter for closed fracture: Secondary | ICD-10-CM | POA: Insufficient documentation

## 2012-08-02 DIAGNOSIS — S20219A Contusion of unspecified front wall of thorax, initial encounter: Secondary | ICD-10-CM

## 2012-08-02 DIAGNOSIS — Y92410 Unspecified street and highway as the place of occurrence of the external cause: Secondary | ICD-10-CM | POA: Insufficient documentation

## 2012-08-02 DIAGNOSIS — S298XXA Other specified injuries of thorax, initial encounter: Secondary | ICD-10-CM | POA: Insufficient documentation

## 2012-08-02 DIAGNOSIS — H1130 Conjunctival hemorrhage, unspecified eye: Secondary | ICD-10-CM | POA: Insufficient documentation

## 2012-08-02 DIAGNOSIS — Z880 Allergy status to penicillin: Secondary | ICD-10-CM | POA: Insufficient documentation

## 2012-08-02 DIAGNOSIS — Z862 Personal history of diseases of the blood and blood-forming organs and certain disorders involving the immune mechanism: Secondary | ICD-10-CM | POA: Insufficient documentation

## 2012-08-02 DIAGNOSIS — E119 Type 2 diabetes mellitus without complications: Secondary | ICD-10-CM | POA: Insufficient documentation

## 2012-08-02 DIAGNOSIS — R51 Headache: Secondary | ICD-10-CM | POA: Insufficient documentation

## 2012-08-02 DIAGNOSIS — H1131 Conjunctival hemorrhage, right eye: Secondary | ICD-10-CM

## 2012-08-02 DIAGNOSIS — Y9389 Activity, other specified: Secondary | ICD-10-CM | POA: Insufficient documentation

## 2012-08-02 MED ORDER — OXYCODONE-ACETAMINOPHEN 5-325 MG PO TABS
2.0000 | ORAL_TABLET | Freq: Once | ORAL | Status: AC
  Administered 2012-08-02: 2 via ORAL
  Filled 2012-08-02: qty 2

## 2012-08-02 MED ORDER — OXYCODONE-ACETAMINOPHEN 5-325 MG PO TABS: 1.0000 | ORAL_TABLET | ORAL | Status: DC | PRN
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
## 2015-01-20 ENCOUNTER — Encounter (HOSPITAL_COMMUNITY): Payer: Self-pay | Admitting: Emergency Medicine

## 2015-01-20 ENCOUNTER — Emergency Department (HOSPITAL_COMMUNITY)
Admission: EM | Admit: 2015-01-20 | Discharge: 2015-01-21 | Disposition: A | Discharge status: Home / Self Care | Payer: Self-pay | Attending: Emergency Medicine | Admitting: Emergency Medicine

## 2015-01-20 DIAGNOSIS — Z794 Long term (current) use of insulin: Secondary | ICD-10-CM | POA: Insufficient documentation

## 2015-01-20 DIAGNOSIS — R531 Weakness: Secondary | ICD-10-CM | POA: Insufficient documentation

## 2015-01-20 DIAGNOSIS — E119 Type 2 diabetes mellitus without complications: Secondary | ICD-10-CM | POA: Insufficient documentation

## 2015-01-20 DIAGNOSIS — Z79899 Other long term (current) drug therapy: Secondary | ICD-10-CM | POA: Insufficient documentation

## 2015-01-20 DIAGNOSIS — N939 Abnormal uterine and vaginal bleeding, unspecified: Secondary | ICD-10-CM

## 2015-01-20 DIAGNOSIS — Z880 Allergy status to penicillin: Secondary | ICD-10-CM | POA: Insufficient documentation

## 2015-01-20 DIAGNOSIS — Z720 Tobacco use: Secondary | ICD-10-CM | POA: Insufficient documentation

## 2015-01-20 DIAGNOSIS — G8918 Other acute postprocedural pain: Secondary | ICD-10-CM | POA: Insufficient documentation

## 2015-01-20 DIAGNOSIS — Z793 Long term (current) use of hormonal contraceptives: Secondary | ICD-10-CM | POA: Insufficient documentation

## 2015-01-20 DIAGNOSIS — R109 Unspecified abdominal pain: Secondary | ICD-10-CM | POA: Insufficient documentation

## 2015-01-20 DIAGNOSIS — Z862 Personal history of diseases of the blood and blood-forming organs and certain disorders involving the immune mechanism: Secondary | ICD-10-CM | POA: Insufficient documentation

## 2015-01-20 LAB — BASIC METABOLIC PANEL
Anion gap: 6 (ref 5–15)
BUN: 13 mg/dL (ref 6–20)
CALCIUM: 8.9 mg/dL (ref 8.9–10.3)
CO2: 23 mmol/L (ref 22–32)
CREATININE: 1.3 mg/dL — AB (ref 0.44–1.00)
Chloride: 108 mmol/L (ref 101–111)
GFR calc Af Amer: 59 mL/min — ABNORMAL LOW (ref >60)
GFR, EST NON AFRICAN AMERICAN: 51 mL/min — AB (ref >60)
GLUCOSE: 124 mg/dL — AB (ref 65–99)
POTASSIUM: 4.1 mmol/L (ref 3.5–5.1)
Sodium: 137 mmol/L (ref 135–145)

## 2015-01-20 LAB — CBC
HEMATOCRIT: 35.8 % — AB (ref 36.0–46.0)
Hemoglobin: 11.4 g/dL — ABNORMAL LOW (ref 12.0–15.0)
MCH: 25.1 pg — ABNORMAL LOW (ref 26.0–34.0)
MCHC: 31.8 g/dL (ref 30.0–36.0)
MCV: 78.9 fL (ref 78.0–100.0)
Platelets: 255 10*3/uL (ref 150–400)
RBC: 4.54 MIL/uL (ref 3.87–5.11)
RDW: 22.9 % — AB (ref 11.5–15.5)
WBC: 5.9 10*3/uL (ref 4.0–10.5)

## 2015-01-20 MED ORDER — SODIUM CHLORIDE 0.9 % IV BOLUS (SEPSIS)
1000.0000 mL | Freq: Once | INTRAVENOUS | Status: AC
  Administered 2015-01-21: 1000 mL via INTRAVENOUS

## 2015-01-20 NOTE — ED Provider Notes (Addendum)
CSN: [id number]     Arrival date & time 01/20/15  2117 History   By signing my name below, I, Amy Lowe, attest that this documentation has been prepared under the direction and in the presence of Junius Creamer, NP Electronically Signed: Hilda Lowe, ED Scribe. 01/20/2015. 11:48 PM.  Chief Complaint  Patient presents with  . Vaginal Bleeding  . Weakness      Patient is a 39 y.o. female presenting with weakness. The history is provided by the patient. No language interpreter was used.  Weakness This is a new problem. The current episode started 6 to 12 hours ago. The problem occurs constantly. The problem has not changed since onset.Associated symptoms include abdominal pain. Pertinent negatives include no shortness of breath. Nothing aggravates the symptoms. Nothing relieves the symptoms. She has tried nothing for the symptoms. The treatment provided no relief.   HPI Comments: Amy Lowe is a 39 y.o. female who presents to the Emergency Department complaining of constant, heavy vaginal bleeding with associated menstrual cramps and pain that has been present for two days. Pt states she had a DNC two days ago to removed a polyp to improve the heavy bleeding during her cycles. Pt states she has had 4 golf ball sized clots today, and states her bleeding has progressively worsened over the past two days. Pt reports her pain is worsening as well as she states the pain medications she is taking from her surgery are not working, and states she feels very weak. Pt states she is changing pads every two hours due to the constant bleeding.    Past Medical History  Diagnosis Date  . Diabetes mellitus   . Anemia    Past Surgical History  Procedure Laterality Date  . Abdominal laparascopy    . Dilation and curettage of uterus    . Hernia repair    . Dnc     History reviewed. No pertinent family history. Social History  Substance Use Topics  . Smoking status: Current Some Day Smoker  .  Smokeless tobacco: None  . Alcohol Use: No   OB History    No data available     Review of Systems  Constitutional: Negative for fever and chills.  Respiratory: Negative for shortness of breath.   Gastrointestinal: Positive for abdominal pain.  Genitourinary: Positive for vaginal bleeding and vaginal pain. Negative for dysuria.  Neurological: Positive for weakness.  All other systems reviewed and are negative.     Allergies  Penicillins and Iodinated diagnostic agents  Home Medications   Prior to Admission medications   Medication Sig Start Date End Date Taking? Authorizing Provider  HYDROcodone-acetaminophen (NORCO/VICODIN) 5-325 MG tablet Take 1 tablet by mouth every 6 (six) hours as needed for moderate pain.   Yes Historical Provider, MD  ibuprofen (ADVIL,MOTRIN) 200 MG tablet Take 600 mg by mouth every 6 (six) hours as needed. For pain relief   Yes Historical Provider, MD  insulin detemir (LEVEMIR) 100 UNIT/ML injection Inject 18 Units into the skin 2 (two) times daily.   Yes Historical Provider, MD  insulin lispro (HUMALOG) 100 UNIT/ML injection Inject 8 Units into the skin 3 (three) times daily before meals.   Yes Historical Provider, MD  lisinopril (PRINIVIL,ZESTRIL) 10 MG tablet Take 10 mg by mouth daily.   Yes Historical Provider, MD  norethindrone (AYGESTIN) 5 MG tablet Take 5 mg by mouth daily.   Yes Historical Provider, MD  clindamycin (CLEOCIN) 150 MG capsule 2 PO TID Patient not  taking: Reported on 01/20/2015 10/15/11   Dalia Heading, PA-C  diphenhydrAMINE (BENADRYL) 25 MG tablet Take 25 mg by mouth every 6 (six) hours as needed. For allergy symptom relief    Historical Provider, MD  oxyCODONE-acetaminophen (PERCOCET/ROXICET) 5-325 MG per tablet Take 1 tablet by mouth every 4 (four) hours as needed for pain. Patient not taking: Reported on 01/20/2015 08/02/12   Pattricia Boss, MD   BP 176/79 mmHg  Pulse 65  Temp(Src) 98.6 F (37 C) (Oral)  Resp 18  SpO2  99% Physical Exam  Constitutional: She appears well-developed and well-nourished. She appears distressed.  Neck: Normal range of motion.  Cardiovascular: Normal rate and regular rhythm.   Pulmonary/Chest: Effort normal.  Abdominal: She exhibits distension. There is tenderness.  Genitourinary: Uterus is tender. Uterus is not deviated and not fixed. Cervix exhibits discharge. Right adnexum displays tenderness and fullness. Left adnexum displays tenderness and fullness.  Large clot removed from cervical os bleeding decreased, + tenderness     ED Course  Procedures (including critical care time)  DIAGNOSTIC STUDIES: Oxygen Saturation is 100% on room air, normal by my interpretation.    COORDINATION OF CARE: 3:44 AM Discussed treatment plan with pt at bedside and pt agreed to plan.   Labs Review Labs Reviewed  BASIC METABOLIC PANEL - Abnormal; Notable for the following:    Glucose, Bld 124 (*)    Creatinine, Ser 1.30 (*)    GFR calc non Af Amer 51 (*)    GFR calc Af Amer 59 (*)    All other components within normal limits  CBC - Abnormal; Notable for the following:    Hemoglobin 11.4 (*)    HCT 35.8 (*)    MCH 25.1 (*)    RDW 22.9 (*)    All other components within normal limits    Imaging Review US Transvaginal Non-ob  01/21/2015  CLINICAL DATA:  Post D &C 2 days prior, now with vaginal bleeding and passage of clots. EXAM: TRANSABDOMINAL AND TRANSVAGINAL ULTRASOUND OF PELVIS TECHNIQUE: Both transabdominal and transvaginal ultrasound examinations of the pelvis were performed. Transabdominal technique was performed for global imaging of the pelvis including uterus, ovaries, adnexal regions, and pelvic cul-de-sac. It was necessary to proceed with endovaginal exam following the transabdominal exam to visualize the endometrium. COMPARISON:  01/16/2008 FINDINGS: Uterus Measurements: 13.5 x 9.7 x 8.9 cm. Two hypoechoic fibroids are noted, posterior fundal measures 2.6 x 2.8 x 3.0 cm,  exophytic fundal measures 3.4 x 4.2 x 4.0 cm. Endometrium Thickness: 4 mm at the fundus, 10 mm in the lower uterine segment. Questionable clot in the lower uterine segment. A discrete endometrial polyp is not seen. Right ovary Not discretely visualized. Complex avascular tubular structure with low-level internal echoes consistent with hydrosalpinx, similar in appearance to prior exam. Left ovary Not discretely visualized. The previous left hydrosalpinx is no longer seen. Other findings No free fluid. IMPRESSION: 1. Uterine fibroids. Mildly prominent proximal endometrium measuring 10 mm with questionable clot. The endometrium at the fundus is normal in thickness 4 mm. 2. 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 Previous left hydrosalpinx is not seen. Neither ovary is discretely visualized. Electronically Signed   By: Jeb Levering M.D.   On: 01/21/2015 03:03   US Pelvis Complete  01/21/2015  CLINICAL DATA:  Post D &C 2 days prior, now with vaginal bleeding and passage of clots. EXAM: TRANSABDOMINAL AND TRANSVAGINAL ULTRASOUND OF PELVIS TECHNIQUE: Both transabdominal and transvaginal ultrasound examinations of the pelvis were performed.  Transabdominal technique was performed for global imaging of the pelvis including uterus, ovaries, adnexal regions, and pelvic cul-de-sac. It was necessary to proceed with endovaginal exam following the transabdominal exam to visualize the endometrium. COMPARISON:  01/16/2008 FINDINGS: Uterus Measurements: 13.5 x 9.7 x 8.9 cm. Two hypoechoic fibroids are noted, posterior fundal measures 2.6 x 2.8 x 3.0 cm, exophytic fundal measures 3.4 x 4.2 x 4.0 cm. Endometrium Thickness: 4 mm at the fundus, 10 mm in the lower uterine segment. Questionable clot in the lower uterine segment. A discrete endometrial polyp is not seen. Right ovary Not discretely visualized. Complex avascular tubular structure with low-level internal echoes consistent with hydrosalpinx, similar  in appearance to prior exam. Left ovary Not discretely visualized. The previous left hydrosalpinx is no longer seen. Other findings No free fluid. IMPRESSION: 1. Uterine fibroids. Mildly prominent proximal endometrium measuring 10 mm with questionable clot. The endometrium at the fundus is normal in thickness 4 mm. 2. 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 Previous left hydrosalpinx is not seen. Neither ovary is discretely visualized. Electronically Signed   By: Jeb Levering M.D.   On: 01/21/2015 03:03   I have personally reviewed and evaluated these images and lab results as part of my medical decision-making.   EKG Interpretation None     Large clot in uterus post procedure without sign of infection, abscess, perforation. Tubal abscess  Patient has been ambulated steady gait  INsturcted to FU with OB in AM MDM   Final diagnoses:  Post-operative pain   I personally performed the services described in this documentation, which was scribed in my presence. The recorded information has been reviewed and is accurate.   Junius Creamer, NP 01/21/15 0344  Veatrice Kells, MD 01/21/15 [place], NP 01/23/15 2111  Veatrice Kells, MD 01/23/15 2311

## 2015-01-20 NOTE — ED Notes (Signed)
Friday had a DNC done to remove a polyp and help the heavy bleeding during her cycles. Has been having pain and slight bleeding. States today she stood up, "dropped three clots and I'm having blood run down my legs." States she tried calling the MD who performed it wasn't in the office today. States she's starting to get very weak/tired, and her pain/cramping is getting worse even after the prescribed pain medications she was given. Saturating large pads once every 2 hours, states the clots she's passing are larger than a golf ball.

## 2015-01-21 ENCOUNTER — Emergency Department (HOSPITAL_COMMUNITY): Payer: Self-pay

## 2015-01-21 MED ORDER — ONDANSETRON HCL 4 MG/2ML IJ SOLN
4.0000 mg | Freq: Once | INTRAMUSCULAR | Status: AC
  Administered 2015-01-21: 4 mg via INTRAVENOUS
  Filled 2015-01-21: qty 2

## 2015-01-21 MED ORDER — HYDROMORPHONE HCL 1 MG/ML IJ SOLN
1.0000 mg | Freq: Once | INTRAMUSCULAR | Status: AC
  Administered 2015-01-21: 1 mg via INTRAVENOUS
  Filled 2015-01-21: qty 1

## 2015-01-21 NOTE — ED Notes (Signed)
Patient states she took her own Hydrocodone 5/325mg  and Ibuprofen 600 mg for pain.

## 2015-01-21 NOTE — Discharge Instructions (Signed)
You ultrasound shows a large clot in the uterus, NO Perforation, No abscess  Please call your OB  in the morning

## 2015-09-01 ENCOUNTER — Encounter (HOSPITAL_COMMUNITY): Payer: Self-pay | Admitting: Family Medicine

## 2015-09-01 ENCOUNTER — Inpatient Hospital Stay (HOSPITAL_COMMUNITY)
Admission: EM | Admit: 2015-09-01 | Discharge: 2015-09-07 | DRG: 758 | Disposition: A | Discharge status: Home / Self Care | Payer: BLUE CROSS/BLUE SHIELD | Attending: Internal Medicine | Admitting: Internal Medicine

## 2015-09-01 ENCOUNTER — Emergency Department (HOSPITAL_COMMUNITY): Payer: BLUE CROSS/BLUE SHIELD

## 2015-09-01 DIAGNOSIS — D62 Acute posthemorrhagic anemia: Secondary | ICD-10-CM | POA: Diagnosis present

## 2015-09-01 DIAGNOSIS — Z79899 Other long term (current) drug therapy: Secondary | ICD-10-CM

## 2015-09-01 DIAGNOSIS — N731 Chronic parametritis and pelvic cellulitis: Secondary | ICD-10-CM | POA: Diagnosis present

## 2015-09-01 DIAGNOSIS — N920 Excessive and frequent menstruation with regular cycle: Secondary | ICD-10-CM | POA: Diagnosis present

## 2015-09-01 DIAGNOSIS — Z6834 Body mass index (BMI) 34.0-34.9, adult: Secondary | ICD-10-CM

## 2015-09-01 DIAGNOSIS — I10 Essential (primary) hypertension: Secondary | ICD-10-CM | POA: Diagnosis present

## 2015-09-01 DIAGNOSIS — Z91041 Radiographic dye allergy status: Secondary | ICD-10-CM

## 2015-09-01 DIAGNOSIS — E119 Type 2 diabetes mellitus without complications: Secondary | ICD-10-CM | POA: Diagnosis present

## 2015-09-01 DIAGNOSIS — Z880 Allergy status to penicillin: Secondary | ICD-10-CM

## 2015-09-01 DIAGNOSIS — R109 Unspecified abdominal pain: Secondary | ICD-10-CM

## 2015-09-01 DIAGNOSIS — N135 Crossing vessel and stricture of ureter without hydronephrosis: Secondary | ICD-10-CM | POA: Diagnosis present

## 2015-09-01 DIAGNOSIS — D259 Leiomyoma of uterus, unspecified: Secondary | ICD-10-CM | POA: Diagnosis present

## 2015-09-01 DIAGNOSIS — N12 Tubulo-interstitial nephritis, not specified as acute or chronic: Secondary | ICD-10-CM

## 2015-09-01 DIAGNOSIS — D649 Anemia, unspecified: Secondary | ICD-10-CM | POA: Diagnosis present

## 2015-09-01 DIAGNOSIS — E669 Obesity, unspecified: Secondary | ICD-10-CM | POA: Diagnosis present

## 2015-09-01 DIAGNOSIS — N7011 Chronic salpingitis: Principal | ICD-10-CM

## 2015-09-01 DIAGNOSIS — Z794 Long term (current) use of insulin: Secondary | ICD-10-CM

## 2015-09-01 DIAGNOSIS — N1330 Unspecified hydronephrosis: Secondary | ICD-10-CM

## 2015-09-01 DIAGNOSIS — R10A2 Flank pain, left side: Secondary | ICD-10-CM

## 2015-09-01 DIAGNOSIS — N289 Disorder of kidney and ureter, unspecified: Secondary | ICD-10-CM

## 2015-09-01 DIAGNOSIS — D509 Iron deficiency anemia, unspecified: Secondary | ICD-10-CM | POA: Diagnosis present

## 2015-09-01 DIAGNOSIS — B373 Candidiasis of vulva and vagina: Secondary | ICD-10-CM | POA: Diagnosis present

## 2015-09-01 DIAGNOSIS — D539 Nutritional anemia, unspecified: Secondary | ICD-10-CM | POA: Diagnosis present

## 2015-09-01 DIAGNOSIS — R9389 Abnormal findings on diagnostic imaging of other specified body structures: Secondary | ICD-10-CM

## 2015-09-01 DIAGNOSIS — D250 Submucous leiomyoma of uterus: Secondary | ICD-10-CM | POA: Diagnosis present

## 2015-09-01 DIAGNOSIS — D500 Iron deficiency anemia secondary to blood loss (chronic): Secondary | ICD-10-CM

## 2015-09-01 DIAGNOSIS — N730 Acute parametritis and pelvic cellulitis: Secondary | ICD-10-CM

## 2015-09-01 DIAGNOSIS — K649 Unspecified hemorrhoids: Secondary | ICD-10-CM | POA: Diagnosis present

## 2015-09-01 DIAGNOSIS — R509 Fever, unspecified: Secondary | ICD-10-CM | POA: Diagnosis present

## 2015-09-01 DIAGNOSIS — N390 Urinary tract infection, site not specified: Secondary | ICD-10-CM | POA: Diagnosis present

## 2015-09-01 LAB — PREGNANCY, URINE: PREG TEST UR: NEGATIVE

## 2015-09-01 LAB — BASIC METABOLIC PANEL
ANION GAP: 9 (ref 5–15)
BUN: 11 mg/dL (ref 6–20)
CALCIUM: 8.4 mg/dL — AB (ref 8.9–10.3)
CO2: 25 mmol/L (ref 22–32)
Chloride: 98 mmol/L — ABNORMAL LOW (ref 101–111)
Creatinine, Ser: 1.25 mg/dL — ABNORMAL HIGH (ref 0.44–1.00)
GFR, EST NON AFRICAN AMERICAN: 53 mL/min — AB (ref >60)
GLUCOSE: 184 mg/dL — AB (ref 65–99)
Potassium: 3.3 mmol/L — ABNORMAL LOW (ref 3.5–5.1)
Sodium: 132 mmol/L — ABNORMAL LOW (ref 135–145)

## 2015-09-01 LAB — URINALYSIS, ROUTINE W REFLEX MICROSCOPIC
GLUCOSE, UA: NEGATIVE mg/dL
Ketones, ur: 40 mg/dL — AB
Nitrite: POSITIVE — AB
Protein, ur: 100 mg/dL — AB
SPECIFIC GRAVITY, URINE: 1.019 (ref 1.005–1.030)
pH: 5 (ref 5.0–8.0)

## 2015-09-01 LAB — URINE MICROSCOPIC-ADD ON

## 2015-09-01 MED ORDER — SODIUM CHLORIDE 0.9 % IV SOLN
1000.0000 mL | INTRAVENOUS | Status: DC
  Administered 2015-09-02: 1000 mL via INTRAVENOUS

## 2015-09-01 MED ORDER — SODIUM CHLORIDE 0.9 % IV SOLN
1000.0000 mL | Freq: Once | INTRAVENOUS | Status: AC
  Administered 2015-09-01: 1000 mL via INTRAVENOUS

## 2015-09-01 MED ORDER — HYDROMORPHONE HCL 1 MG/ML IJ SOLN
1.0000 mg | Freq: Once | INTRAMUSCULAR | Status: AC
  Administered 2015-09-01: 1 mg via INTRAVENOUS
  Filled 2015-09-01: qty 1

## 2015-09-01 MED ORDER — ONDANSETRON HCL 4 MG/2ML IJ SOLN
4.0000 mg | Freq: Once | INTRAMUSCULAR | Status: AC
  Administered 2015-09-01: 4 mg via INTRAVENOUS
  Filled 2015-09-01: qty 2

## 2015-09-01 NOTE — ED Provider Notes (Signed)
CSN: [id number]     Arrival date & time 09/01/15  2151 History   First MD Initiated Contact with Patient 09/01/15 2255     Chief Complaint  Patient presents with  . Flank Pain  . Abdominal Pain     (Consider location/radiation/quality/duration/timing/severity/associated sxs/prior Treatment) Patient is a 40 y.o. female presenting with flank pain and abdominal pain. The history is provided by the patient.  Flank Pain Associated symptoms include abdominal pain.  Abdominal Pain She has a history of diabetes. She comes in with a four-day history of left flank pain which radiates to the left lower abdomen. Pain has become severe over the last 2 days. She rates pain at 10/10. Pain in the flank is sharp and it is throbbing in the lower abdomen. There is associated nausea and vomiting. She denies dysuria or frequency. She denies fever or chills. She denies any recent trauma although she does do a lot of lifting at work. She took a dose of hydrocodone-acetaminophen without any relief. She thought it might of been a urinary tract infection so she took over-the-counter Azo which has not been giving her relief. Symptoms started as she completed her menses. 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  Past Medical History  Diagnosis Date  . Diabetes mellitus   . Anemia    Past Surgical History  Procedure Laterality Date  . Abdominal laparascopy    . Dilation and curettage of uterus    . Hernia repair    . Dnc     History reviewed. No pertinent family history. Social History  Substance Use Topics  . Smoking status: Current Some Day Smoker    Types: Cigarettes  . Smokeless tobacco: None  . Alcohol Use: No   OB History    No data available     Review of Systems  Gastrointestinal: Positive for abdominal pain.  Genitourinary: Positive for flank pain.  All other systems reviewed and are negative.     Allergies   Penicillins and Iodinated diagnostic agents  Home Medications   Prior to Admission medications   Medication Sig Start Date End Date Taking? Authorizing Provider  diphenhydrAMINE (BENADRYL) 25 MG tablet Take 25 mg by mouth every 6 (six) hours as needed. For allergy symptom relief   Yes Historical Provider, MD  HYDROcodone-acetaminophen (NORCO/VICODIN) 5-325 MG tablet Take 1 tablet by mouth every 6 (six) hours as needed for moderate pain.   Yes Historical Provider, MD  ibuprofen (ADVIL,MOTRIN) 200 MG tablet Take 600 mg by mouth every 6 (six) hours as needed. For pain relief   Yes Historical Provider, MD  insulin detemir (LEVEMIR) 100 UNIT/ML injection Inject 18 Units into the skin 2 (two) times daily.   Yes Historical Provider, MD  insulin lispro (HUMALOG) 100 UNIT/ML injection Inject 8 Units into the skin 3 (three) times daily before meals.   Yes Historical Provider, MD  lisinopril (PRINIVIL,ZESTRIL) 10 MG tablet Take 10 mg by mouth daily.   Yes Historical Provider, MD  tranexamic acid (LYSTEDA) 650 MG TABS tablet Take 1 tablet by mouth 3 (three) times daily. Take two tablets three times daily during the first five days of menses. 08/22/15  Yes Historical Provider, MD  clindamycin (CLEOCIN) 150 MG capsule 2 PO TID Patient not taking: Reported on 01/20/2015 10/15/11   Dalia Heading, PA-C  oxyCODONE-acetaminophen (PERCOCET/ROXICET) 5-325 MG per tablet Take 1 tablet by mouth every 4 (four) hours as needed for  pain. Patient not taking: Reported on 01/20/2015 08/02/12   Pattricia Boss, MD   BP 126/55 mmHg  Pulse 103  Temp(Src) 99.1 F (37.3 C) (Oral)  Resp 18  Ht 5\' 4"  (1.626 m)  Wt 215 lb (97.523 kg)  BMI 36.89 kg/m2  SpO2 100%  LMP 08/23/2015 Physical Exam  Nursing note and vitals reviewed.  40 year old female, who appears uncomfortable, but is in no acute distress. Vital signs are significant for mild tachycardia. Oxygen saturation is 100%, which is normal. Head is normocephalic and  atraumatic. PERRLA, EOMI. Oropharynx is clear. Neck is nontender and supple without adenopathy or JVD. Back is nontender in the midline. There is moderate left CVA tenderness. Lungs are clear without rales, wheezes, or rhonchi. Chest is nontender. Heart has regular rate and rhythm without murmur. Abdomen is soft, flat, with moderate to severe tenderness well localized in the left lower quadrant. There is no rebound or guarding. There are no masses or hepatosplenomegaly and peristalsis is hypoactive. Extremities have no cyanosis or edema, full range of motion is present. Skin is warm and dry without rash. Neurologic: Mental status is normal, cranial nerves are intact, there are no motor or sensory deficits.  ED Course  Procedures (including critical care time) Labs Review Results for orders placed or performed during the hospital encounter of 09/01/15  Urinalysis, Routine w reflex microscopic- may I&O cath if menses  Result Value Ref Range   Color, Urine RED (A) YELLOW   APPearance TURBID (A) CLEAR   Specific Gravity, Urine 1.019 1.005 - 1.030   pH 5.0 5.0 - 8.0   Glucose, UA NEGATIVE NEGATIVE mg/dL   Hgb urine dipstick SMALL (A) NEGATIVE   Bilirubin Urine MODERATE (A) NEGATIVE   Ketones, ur 40 (A) NEGATIVE mg/dL   Protein, ur 100 (A) NEGATIVE mg/dL   Nitrite POSITIVE (A) NEGATIVE   Leukocytes, UA LARGE (A) NEGATIVE  Pregnancy, urine  Result Value Ref Range   Preg Test, Ur NEGATIVE NEGATIVE  Urine microscopic-add on  Result Value Ref Range   Squamous Epithelial / LPF 0-5 (A) NONE SEEN   WBC, UA TOO NUMEROUS TO COUNT 0 - 5 WBC/hpf   RBC / HPF 0-5 0 - 5 RBC/hpf   Bacteria, UA MANY (A) NONE SEEN  Basic metabolic panel  Result Value Ref Range   Sodium 132 (L) 135 - 145 mmol/L   Potassium 3.3 (L) 3.5 - 5.1 mmol/L   Chloride 98 (L) 101 - 111 mmol/L   CO2 25 22 - 32 mmol/L   Glucose, Bld 184 (H) 65 - 99 mg/dL   BUN 11 6 - 20 mg/dL   Creatinine, Ser 1.25 (H) 0.44 - 1.00 mg/dL    Calcium 8.4 (L) 8.9 - 10.3 mg/dL   GFR calc non Af Amer 53 (L) >60 mL/min   GFR calc Af Amer >60 >60 mL/min   Anion gap 9 5 - 15  CBC with Differential/Platelet  Result Value Ref Range   WBC 11.9 (H) 4.0 - 10.5 K/uL   RBC 2.73 (L) 3.87 - 5.11 MIL/uL   Hemoglobin 3.8 (LL) 12.0 - 15.0 g/dL   HCT 14.6 (L) 36.0 - 46.0 %   MCV 53.5 (L) 78.0 - 100.0 fL   MCH 13.9 (L) 26.0 - 34.0 pg   MCHC 26.0 (L) 30.0 - 36.0 g/dL   RDW 21.5 (H) 11.5 - 15.5 %   Platelets 272 150 - 400 K/uL   Neutrophils Relative % 72 %   Neutro Abs  8.6 (H) 1.7 - 7.7 K/uL   Lymphocytes Relative 14 %   Lymphs Abs 1.6 0.7 - 4.0 K/uL   Monocytes Relative 14 %   Monocytes Absolute 1.6 (H) 0.1 - 1.0 K/uL   Eosinophils Relative 0 %   Eosinophils Absolute 0.0 0.0 - 0.7 K/uL   Basophils Relative 0 %   Basophils Absolute 0.0 0.0 - 0.1 K/uL   RBC Morphology ELLIPTOCYTES    Smear Review LARGE PLATELETS PRESENT   Prepare RBC  Result Value Ref Range   Order Confirmation ORDER PROCESSED BY BLOOD BANK   Type and screen [place]  Result Value Ref Range   ABO/RH(D) O POS    Antibody Screen NEG    Sample Expiration 09/05/2015    Unit Number [id number]    Blood Component Type RBC LR PHER2    Unit division 00    Status of Unit ISSUED    Transfusion Status OK TO TRANSFUSE    Crossmatch Result Compatible    Unit Number [id number]    Blood Component Type RED CELLS,LR    Unit division 00    Status of Unit ALLOCATED    Transfusion Status OK TO TRANSFUSE    Crossmatch Result Compatible   ABO/Rh  Result Value Ref Range   ABO/RH(D) O POS    Imaging Review Ct Renal Stone Study  09/02/2015  CLINICAL DATA:  Left flank and abdominal pain, acute onset. Right blood cells and [place] blood cells in the urine. Initial encounter. EXAM: CT ABDOMEN AND PELVIS WITHOUT CONTRAST TECHNIQUE: Multidetector CT imaging of the abdomen and pelvis was performed following the standard protocol without IV contrast. COMPARISON:   Pelvic ultrasound performed 01/21/2015 FINDINGS: The visualized lung bases are clear. The liver is unremarkable in appearance. The spleen is mildly enlarged, measuring 13.7 cm in length. The gallbladder is within normal limits. The pancreas and adrenal glands are unremarkable. There is relatively severe chronic right-sided hydronephrosis, with diffuse dilatation of the right ureter. No distal obstructing stone is seen. This may reflect mass effect from the patient's hydrosalpinges. The left kidney is grossly unremarkable in appearance. Nonspecific perinephric stranding is noted bilaterally. No nonobstructing renal stones are identified. Prominent retroperitoneal nodes measure up to 1.2 cm in short axis. These are difficult to fully assess without contrast. No free fluid is identified. The small bowel is unremarkable in appearance. The stomach is within normal limits. No acute vascular abnormalities are seen. Minimal vascular calcifications are seen. The appendix is normal in caliber, without evidence of appendicitis. The colon is grossly unremarkable in appearance. The bladder is mildly distended and grossly unremarkable. An apparent 5 cm fibroid is noted within the uterus. A likely exophytic fibroid is seen on the right. Prominent bilateral tubular cystic structures are noted at the pelvis, which may reflect recurrent bilateral hydrosalpinges, left larger than right. The ovaries are not well assessed. No inguinal lymphadenopathy is seen. No acute osseous abnormalities are identified. IMPRESSION: 1. Prominent retroperitoneal nodes measure up to 1.2 cm in short axis. These are difficult to fully assess without contrast. Malignancy cannot be entirely excluded. Would correlate with the patient's history, and consider contrast-enhanced CT or PET/CT when and as deemed clinically appropriate. 2. Relatively severe chronic right-sided hydronephrosis, with diffuse dilatation of the right ureter. This may reflect mild  mass-effect from the patient's apparent hydrosalpinges. No obstructing stone seen. 3. Prominent bilateral tubular cystic structures at the pelvis, which may reflect recurrent bilateral hydrosalpinges, left larger than right. This could be further  assessed on pelvic ultrasound, or on the follow-up study mentioned above. 4. Uterine fibroids again noted. 5. Mild splenomegaly. Electronically Signed   By: Garald Balding M.D.   On: 09/02/2015 00:48   I have personally reviewed and evaluated these images and lab results as part of my medical decision-making.   MDM   Final diagnoses:  Left flank pain  Pyelonephritis  Anemia due to blood loss  Hydrosalpinx  Hydronephrosis, unspecified hydronephrosis type  Renal insufficiency    Left flank pain worrisome for ureterolithiasis and ureteral colic. Urinalysis is sent to rule out urinary tract infection. She is somewhat on the inside, but diverticulitis is also a consideration. She will be sent for CT scan for further evaluation. She is given IV fluids, hydromorphone, ondansetron. Old records are reviewed and she has no relevant past visits.  CT shows no evidence of urolithiasis. She has chronic right hydronephrosis and significant hydrosalpinx. Urinalysis does show evidence of infection which accounts for her flank pain. She feels much better after above noted treatment. She does have a severe penicillin allergy so she is given a dose of levofloxacin for her urinary tract infection. Urine is been sent for culture. 6 CBC is come back with severe anemia-hemoglobin 3.8. She has had significant anemia in the past with hemoglobins as low as 7.6. I talked with the patient about this and she has very heavy menses and her gynecologist has been trying to convince her to have a hysterectomy. She is unable to tolerate iron and as not been a candidate for hormonal manipulation. Her hemodynamic status is status is stable. Heart rate is come down to 97 and blood pressure is  adequate. Blood is ordered for transfusion. Case is discussed with Dr. Hal Hope of [facility] who agrees to admit the patient under observation status to complete her transfusion. Following that, she will be referred back to her gynecologist.  Delora Fuel, MD [id number]

## 2015-09-01 NOTE — ED Notes (Signed)
Patient is complaining of left flank and abd pain that started last week. Reports nausea with no vomiting. Denies fever, diarrhea, and any urinary symptoms. Pt reports she thought it was a UTI, started drinking cranberry juice, started OTC AZO, and Vicodin for the last 2 days.

## 2015-09-01 NOTE — ED Notes (Signed)
Pt ambulated to restroom. 

## 2015-09-01 NOTE — ED Notes (Signed)
Pt attempting to give urine sample

## 2015-09-02 ENCOUNTER — Encounter (HOSPITAL_COMMUNITY): Payer: Self-pay | Admitting: Nurse Practitioner

## 2015-09-02 ENCOUNTER — Observation Stay (HOSPITAL_COMMUNITY): Payer: BLUE CROSS/BLUE SHIELD

## 2015-09-02 DIAGNOSIS — Z91041 Radiographic dye allergy status: Secondary | ICD-10-CM | POA: Diagnosis not present

## 2015-09-02 DIAGNOSIS — N1330 Unspecified hydronephrosis: Secondary | ICD-10-CM | POA: Diagnosis present

## 2015-09-02 DIAGNOSIS — Z794 Long term (current) use of insulin: Secondary | ICD-10-CM | POA: Diagnosis not present

## 2015-09-02 DIAGNOSIS — Z79899 Other long term (current) drug therapy: Secondary | ICD-10-CM | POA: Diagnosis not present

## 2015-09-02 DIAGNOSIS — R109 Unspecified abdominal pain: Secondary | ICD-10-CM | POA: Diagnosis present

## 2015-09-02 DIAGNOSIS — D539 Nutritional anemia, unspecified: Secondary | ICD-10-CM | POA: Diagnosis present

## 2015-09-02 DIAGNOSIS — Z6834 Body mass index (BMI) 34.0-34.9, adult: Secondary | ICD-10-CM | POA: Diagnosis not present

## 2015-09-02 DIAGNOSIS — D259 Leiomyoma of uterus, unspecified: Secondary | ICD-10-CM | POA: Diagnosis present

## 2015-09-02 DIAGNOSIS — N920 Excessive and frequent menstruation with regular cycle: Secondary | ICD-10-CM | POA: Diagnosis present

## 2015-09-02 DIAGNOSIS — D500 Iron deficiency anemia secondary to blood loss (chronic): Secondary | ICD-10-CM | POA: Insufficient documentation

## 2015-09-02 DIAGNOSIS — N731 Chronic parametritis and pelvic cellulitis: Secondary | ICD-10-CM | POA: Diagnosis present

## 2015-09-02 DIAGNOSIS — N7011 Chronic salpingitis: Secondary | ICD-10-CM | POA: Diagnosis present

## 2015-09-02 DIAGNOSIS — K649 Unspecified hemorrhoids: Secondary | ICD-10-CM | POA: Diagnosis present

## 2015-09-02 DIAGNOSIS — B373 Candidiasis of vulva and vagina: Secondary | ICD-10-CM | POA: Diagnosis present

## 2015-09-02 DIAGNOSIS — E669 Obesity, unspecified: Secondary | ICD-10-CM | POA: Diagnosis present

## 2015-09-02 DIAGNOSIS — D509 Iron deficiency anemia, unspecified: Secondary | ICD-10-CM | POA: Diagnosis present

## 2015-09-02 DIAGNOSIS — N390 Urinary tract infection, site not specified: Secondary | ICD-10-CM | POA: Diagnosis present

## 2015-09-02 DIAGNOSIS — D62 Acute posthemorrhagic anemia: Secondary | ICD-10-CM | POA: Diagnosis present

## 2015-09-02 DIAGNOSIS — E119 Type 2 diabetes mellitus without complications: Secondary | ICD-10-CM

## 2015-09-02 DIAGNOSIS — D250 Submucous leiomyoma of uterus: Secondary | ICD-10-CM | POA: Diagnosis present

## 2015-09-02 DIAGNOSIS — N135 Crossing vessel and stricture of ureter without hydronephrosis: Secondary | ICD-10-CM | POA: Diagnosis present

## 2015-09-02 DIAGNOSIS — I10 Essential (primary) hypertension: Secondary | ICD-10-CM | POA: Diagnosis present

## 2015-09-02 DIAGNOSIS — Z880 Allergy status to penicillin: Secondary | ICD-10-CM | POA: Diagnosis not present

## 2015-09-02 DIAGNOSIS — R509 Fever, unspecified: Secondary | ICD-10-CM | POA: Diagnosis present

## 2015-09-02 DIAGNOSIS — N289 Disorder of kidney and ureter, unspecified: Secondary | ICD-10-CM | POA: Diagnosis present

## 2015-09-02 DIAGNOSIS — D649 Anemia, unspecified: Secondary | ICD-10-CM | POA: Diagnosis present

## 2015-09-02 LAB — GLUCOSE, CAPILLARY
GLUCOSE-CAPILLARY: 178 mg/dL — AB (ref 65–99)
GLUCOSE-CAPILLARY: 299 mg/dL — AB (ref 65–99)
GLUCOSE-CAPILLARY: 53 mg/dL — AB (ref 65–99)
GLUCOSE-CAPILLARY: 46 mg/dL — AB (ref 65–99)
GLUCOSE-CAPILLARY: 107 mg/dL — AB (ref 65–99)
Glucose-Capillary: 109 mg/dL — ABNORMAL HIGH (ref 65–99)
Glucose-Capillary: 69 mg/dL (ref 65–99)
Glucose-Capillary: 83 mg/dL (ref 65–99)
Glucose-Capillary: 53 mg/dL — ABNORMAL LOW (ref 65–99)

## 2015-09-02 LAB — CBC WITH DIFFERENTIAL/PLATELET
BASOS PCT: 0 %
Basophils Absolute: 0 10*3/uL (ref 0.0–0.1)
EOS ABS: 0 10*3/uL (ref 0.0–0.7)
EOS PCT: 0 %
HEMATOCRIT: 14.6 % — AB (ref 36.0–46.0)
Hemoglobin: 3.8 g/dL — CL (ref 12.0–15.0)
LYMPHS ABS: 1.6 10*3/uL (ref 0.7–4.0)
Lymphocytes Relative: 14 %
MCH: 13.9 pg — AB (ref 26.0–34.0)
MCHC: 26 g/dL — ABNORMAL LOW (ref 30.0–36.0)
MCV: 53.5 fL — AB (ref 78.0–100.0)
MONO ABS: 1.6 10*3/uL — AB (ref 0.1–1.0)
MONOS PCT: 14 %
NEUTROS PCT: 72 %
Neutro Abs: 8.6 10*3/uL — ABNORMAL HIGH (ref 1.7–7.7)
Platelets: 272 10*3/uL (ref 150–400)
RBC: 2.73 MIL/uL — ABNORMAL LOW (ref 3.87–5.11)
RDW: 21.5 % — AB (ref 11.5–15.5)
WBC: 11.9 10*3/uL — ABNORMAL HIGH (ref 4.0–10.5)

## 2015-09-02 LAB — BASIC METABOLIC PANEL
ANION GAP: 11 (ref 5–15)
BUN: 10 mg/dL (ref 6–20)
CO2: 25 mmol/L (ref 22–32)
Calcium: 8.7 mg/dL — ABNORMAL LOW (ref 8.9–10.3)
Chloride: 97 mmol/L — ABNORMAL LOW (ref 101–111)
Creatinine, Ser: 1.11 mg/dL — ABNORMAL HIGH (ref 0.44–1.00)
GFR calc Af Amer: >60 mL/min (ref >60)
GFR calc non Af Amer: >60 mL/min (ref >60)
Glucose, Bld: 171 mg/dL — ABNORMAL HIGH (ref 65–99)
POTASSIUM: 3.7 mmol/L (ref 3.5–5.1)
SODIUM: 133 mmol/L — AB (ref 135–145)

## 2015-09-02 LAB — IRON AND TIBC
IRON: 9 ug/dL — AB (ref 28–170)
Saturation Ratios: 3 % — ABNORMAL LOW (ref 10.4–31.8)
TIBC: 319 ug/dL (ref 250–450)
UIBC: 310 ug/dL

## 2015-09-02 LAB — RETICULOCYTES
RBC.: 3.51 MIL/uL — ABNORMAL LOW (ref 3.87–5.11)
Retic Ct Pct: <0.4 % — ABNORMAL LOW (ref 0.4–3.1)

## 2015-09-02 LAB — ABO/RH: ABO/RH(D): O POS

## 2015-09-02 LAB — FOLATE: FOLATE: 13.2 ng/mL (ref >5.9)

## 2015-09-02 LAB — FERRITIN: Ferritin: 22 ng/mL (ref 11–307)

## 2015-09-02 LAB — PREPARE RBC (CROSSMATCH)

## 2015-09-02 LAB — VITAMIN B12: Vitamin B-12: 668 pg/mL (ref 180–914)

## 2015-09-02 LAB — GENTAMICIN LEVEL, RANDOM: GENTAMICIN RM: 8.3 ug/mL

## 2015-09-02 LAB — PROTIME-INR
INR: 1.38 (ref 0.00–1.49)
PROTHROMBIN TIME: 17 s — AB (ref 11.6–15.2)

## 2015-09-02 MED ORDER — FUROSEMIDE 10 MG/ML IJ SOLN
20.0000 mg | Freq: Once | INTRAMUSCULAR | Status: AC
  Administered 2015-09-02: 20 mg via INTRAVENOUS
  Filled 2015-09-02: qty 2

## 2015-09-02 MED ORDER — LEVOFLOXACIN IN D5W 500 MG/100ML IV SOLN
500.0000 mg | Freq: Once | INTRAVENOUS | Status: AC
  Administered 2015-09-02: 500 mg via INTRAVENOUS
  Filled 2015-09-02: qty 100

## 2015-09-02 MED ORDER — CALCIUM CARBONATE ANTACID 500 MG PO CHEW
1.0000 | CHEWABLE_TABLET | Freq: Two times a day (BID) | ORAL | Status: DC
  Administered 2015-09-02 – 2015-09-07 (×10): 200 mg via ORAL
  Filled 2015-09-02 (×10): qty 1

## 2015-09-02 MED ORDER — MIDAZOLAM HCL 2 MG/2ML IJ SOLN
INTRAMUSCULAR | Status: AC
  Filled 2015-09-02: qty 4

## 2015-09-02 MED ORDER — INSULIN ASPART 100 UNIT/ML ~~LOC~~ SOLN
0.0000 [IU] | Freq: Three times a day (TID) | SUBCUTANEOUS | Status: DC
  Administered 2015-09-02: 5 [IU] via SUBCUTANEOUS
  Administered 2015-09-04: 1 [IU] via SUBCUTANEOUS
  Administered 2015-09-04: 2 [IU] via SUBCUTANEOUS
  Administered 2015-09-04: 1 [IU] via SUBCUTANEOUS
  Administered 2015-09-05: 2 [IU] via SUBCUTANEOUS
  Administered 2015-09-05: 1 [IU] via SUBCUTANEOUS
  Administered 2015-09-06: 2 [IU] via SUBCUTANEOUS

## 2015-09-02 MED ORDER — TRANEXAMIC ACID 650 MG PO TABS
650.0000 mg | ORAL_TABLET | Freq: Three times a day (TID) | ORAL | Status: DC
  Administered 2015-09-02 – 2015-09-06 (×14): 650 mg via ORAL
  Filled 2015-09-02 (×18): qty 1

## 2015-09-02 MED ORDER — LISINOPRIL 10 MG PO TABS
10.0000 mg | ORAL_TABLET | Freq: Every day | ORAL | Status: DC
  Administered 2015-09-02 – 2015-09-07 (×5): 10 mg via ORAL
  Filled 2015-09-02 (×5): qty 1

## 2015-09-02 MED ORDER — HYDROMORPHONE HCL 1 MG/ML IJ SOLN
1.0000 mg | INTRAMUSCULAR | Status: DC | PRN
  Administered 2015-09-02 – 2015-09-04 (×5): 2 mg via INTRAVENOUS
  Administered 2015-09-05 (×2): 1 mg via INTRAVENOUS
  Administered 2015-09-05: 2 mg via INTRAVENOUS
  Administered 2015-09-05: 1 mg via INTRAVENOUS
  Administered 2015-09-05 (×2): 2 mg via INTRAVENOUS
  Administered 2015-09-06: 1 mg via INTRAVENOUS
  Administered 2015-09-06 – 2015-09-07 (×3): 2 mg via INTRAVENOUS
  Filled 2015-09-02: qty 1
  Filled 2015-09-02 (×9): qty 2
  Filled 2015-09-02: qty 1
  Filled 2015-09-02: qty 2
  Filled 2015-09-02 (×2): qty 1
  Filled 2015-09-02 (×2): qty 2

## 2015-09-02 MED ORDER — INSULIN ASPART 100 UNIT/ML ~~LOC~~ SOLN
8.0000 [IU] | Freq: Three times a day (TID) | SUBCUTANEOUS | Status: DC
  Administered 2015-09-02 – 2015-09-07 (×8): 8 [IU] via SUBCUTANEOUS

## 2015-09-02 MED ORDER — DEXTROSE 50 % IV SOLN
INTRAVENOUS | Status: AC
  Administered 2015-09-02: 25 mL
  Filled 2015-09-02: qty 50

## 2015-09-02 MED ORDER — CALCIUM CARBONATE ANTACID 500 MG PO CHEW: 1.0000 | CHEWABLE_TABLET | Freq: Two times a day (BID) | ORAL | Status: DC

## 2015-09-02 MED ORDER — INSULIN DETEMIR 100 UNIT/ML ~~LOC~~ SOLN
18.0000 [IU] | Freq: Two times a day (BID) | SUBCUTANEOUS | Status: DC
  Administered 2015-09-02 – 2015-09-06 (×8): 18 [IU] via SUBCUTANEOUS
  Filled 2015-09-02 (×13): qty 0.18

## 2015-09-02 MED ORDER — CLINDAMYCIN PHOSPHATE 900 MG/50ML IV SOLN
900.0000 mg | Freq: Three times a day (TID) | INTRAVENOUS | Status: DC
  Administered 2015-09-02 – 2015-09-07 (×15): 900 mg via INTRAVENOUS
  Filled 2015-09-02 (×16): qty 50

## 2015-09-02 MED ORDER — INSULIN DETEMIR 100 UNIT/ML ~~LOC~~ SOLN
10.0000 [IU] | Freq: Once | SUBCUTANEOUS | Status: AC
  Administered 2015-09-02: 10 [IU] via SUBCUTANEOUS
  Filled 2015-09-02: qty 0.1

## 2015-09-02 MED ORDER — SODIUM CHLORIDE 0.9 % IV SOLN
Freq: Once | INTRAVENOUS | Status: DC
Start: 2015-09-02 — End: 2015-09-02
  Administered 2015-09-02: 04:00:00 via INTRAVENOUS

## 2015-09-02 MED ORDER — GENTAMICIN SULFATE 40 MG/ML IJ SOLN
7.0000 mg/kg | INTRAVENOUS | Status: DC
  Administered 2015-09-02: 480 mg via INTRAVENOUS
  Filled 2015-09-02: qty 12

## 2015-09-02 MED ORDER — POTASSIUM CHLORIDE CRYS ER 20 MEQ PO TBCR
40.0000 meq | EXTENDED_RELEASE_TABLET | Freq: Four times a day (QID) | ORAL | Status: AC
  Administered 2015-09-02 (×2): 40 meq via ORAL
  Filled 2015-09-02 (×2): qty 2

## 2015-09-02 MED ORDER — HYDROMORPHONE HCL 1 MG/ML IJ SOLN
1.0000 mg | Freq: Once | INTRAMUSCULAR | Status: AC
  Administered 2015-09-02: 1 mg via INTRAVENOUS
  Filled 2015-09-02: qty 1

## 2015-09-02 MED ORDER — ONDANSETRON HCL 4 MG PO TABS
4.0000 mg | ORAL_TABLET | Freq: Four times a day (QID) | ORAL | Status: DC | PRN
  Filled 2015-09-02: qty 1

## 2015-09-02 MED ORDER — HYDROCODONE-ACETAMINOPHEN 5-325 MG PO TABS
1.0000 | ORAL_TABLET | ORAL | Status: DC | PRN
  Administered 2015-09-02 – 2015-09-06 (×10): 1 via ORAL
  Filled 2015-09-02 (×10): qty 1

## 2015-09-02 MED ORDER — MIDAZOLAM HCL 2 MG/2ML IJ SOLN
INTRAMUSCULAR | Status: AC | PRN
  Administered 2015-09-02: 1 mg via INTRAVENOUS

## 2015-09-02 MED ORDER — FENTANYL CITRATE (PF) 100 MCG/2ML IJ SOLN
INTRAMUSCULAR | Status: AC
  Filled 2015-09-02: qty 4

## 2015-09-02 MED ORDER — ACETAMINOPHEN 325 MG PO TABS
650.0000 mg | ORAL_TABLET | Freq: Four times a day (QID) | ORAL | Status: DC | PRN
  Administered 2015-09-02 – 2015-09-03 (×3): 650 mg via ORAL
  Filled 2015-09-02 (×3): qty 2

## 2015-09-02 MED ORDER — HYDROCODONE-ACETAMINOPHEN 5-325 MG PO TABS
1.0000 | ORAL_TABLET | Freq: Four times a day (QID) | ORAL | Status: DC | PRN
  Administered 2015-09-02 (×2): 1 via ORAL
  Filled 2015-09-02 (×2): qty 1

## 2015-09-02 MED ORDER — GENTAMICIN SULFATE 40 MG/ML IJ SOLN
7.0000 mg/kg | INTRAVENOUS | Status: DC
  Administered 2015-09-04 – 2015-09-07 (×2): 480 mg via INTRAVENOUS
  Filled 2015-09-02 (×4): qty 12

## 2015-09-02 MED ORDER — SODIUM CHLORIDE 0.9 % IV SOLN
510.0000 mg | Freq: Once | INTRAVENOUS | Status: AC
  Administered 2015-09-02: 510 mg via INTRAVENOUS
  Filled 2015-09-02: qty 17

## 2015-09-02 MED ORDER — FENTANYL CITRATE (PF) 100 MCG/2ML IJ SOLN
INTRAMUSCULAR | Status: AC | PRN
  Administered 2015-09-02: 50 ug via INTRAVENOUS

## 2015-09-02 MED ORDER — INSULIN LISPRO 100 UNIT/ML ~~LOC~~ SOLN: 8.0000 [IU] | Freq: Three times a day (TID) | SUBCUTANEOUS | Status: DC

## 2015-09-02 MED ORDER — SODIUM CHLORIDE 0.9 % IV SOLN
INTRAVENOUS | Status: AC
  Administered 2015-09-02 – 2015-09-03 (×2): via INTRAVENOUS

## 2015-09-02 MED ORDER — ONDANSETRON HCL 4 MG/2ML IJ SOLN
4.0000 mg | Freq: Four times a day (QID) | INTRAMUSCULAR | Status: DC | PRN
Start: 2015-09-02 — End: 2015-09-05
  Administered 2015-09-02: 4 mg via INTRAVENOUS
  Filled 2015-09-02: qty 2

## 2015-09-02 MED ORDER — SODIUM CHLORIDE 0.9 % IV SOLN: Freq: Once | INTRAVENOUS | Status: DC

## 2015-09-02 MED ORDER — ACETAMINOPHEN 650 MG RE SUPP
650.0000 mg | Freq: Four times a day (QID) | RECTAL | Status: DC | PRN
Start: 2015-09-02 — End: 2015-09-02

## 2015-09-02 NOTE — H&P (Addendum)
History and Physical    [name] [id number] DOB: [birth year] DOA: 09/01/2015  PCP: PROVIDER NOT IN SYSTEM  Patient coming from: Home.  Chief Complaint: Abdominal pain.  HPI: Amy Lowe is a 40 y.o. female with medical history significant of with chronic and deficiency anemia previously received IV iron therapy, fibroid uterus, on diabetes mellitus, hypertension presents to the ER because of increasing pain in the abdomen on the left side over the last 2-3 days. The pain was radiating to her left groin. Had some nausea denies any vomiting or diarrhea. Denies any fever chills chest pain or shortness of breath. In the ER patient's hemoglobin was found to be around 3.8 and UA shows patient is consistent with UTI. CT abdomen and pelvis done shows bilateral hydrosalpinx with right-sided hydronephrosis and hydroureter. Patient has been admitted for further management.   ED Course: IV antibiotics for UTI was started.  Review of Systems: As per HPI otherwise 10 point review of systems negative.    Past Medical History  Diagnosis Date  . Diabetes mellitus   . Anemia     Past Surgical History  Procedure Laterality Date  . Abdominal laparascopy    . Dilation and curettage of uterus    . Hernia repair    . Dnc       reports that she has been smoking Cigarettes.  She does not have any smokeless tobacco history on file. She reports that she does not drink alcohol or use illicit drugs.  Allergies - Penicillin, Contrast.  Family History  Problem Relation Age of Onset  . Lung cancer Father   . Diabetes Mellitus II Maternal Grandmother     Prior to Admission medications   Medication Sig Start Date End Date Taking? Authorizing Provider  diphenhydrAMINE (BENADRYL) 25 MG tablet Take 25 mg by mouth every 6 (six) hours as needed. For allergy symptom relief   Yes Historical Provider, MD  HYDROcodone-acetaminophen (NORCO/VICODIN) 5-325 MG tablet Take 1 tablet by mouth every 6 (six)  hours as needed for moderate pain.   Yes Historical Provider, MD  ibuprofen (ADVIL,MOTRIN) 200 MG tablet Take 600 mg by mouth every 6 (six) hours as needed. For pain relief   Yes Historical Provider, MD  insulin detemir (LEVEMIR) 100 UNIT/ML injection Inject 18 Units into the skin 2 (two) times daily.   Yes Historical Provider, MD  insulin lispro (HUMALOG) 100 UNIT/ML injection Inject 8 Units into the skin 3 (three) times daily before meals.   Yes Historical Provider, MD  lisinopril (PRINIVIL,ZESTRIL) 10 MG tablet Take 10 mg by mouth daily.   Yes Historical Provider, MD  tranexamic acid (LYSTEDA) 650 MG TABS tablet Take 1 tablet by mouth 3 (three) times daily. Take two tablets three times daily during the first five days of menses. 08/22/15  Yes Historical Provider, MD  clindamycin (CLEOCIN) 150 MG capsule 2 PO TID Patient not taking: Reported on 01/20/2015 10/15/11   Dalia Heading, PA-C  oxyCODONE-acetaminophen (PERCOCET/ROXICET) 5-325 MG per tablet Take 1 tablet by mouth every 4 (four) hours as needed for pain. Patient not taking: Reported on 01/20/2015 08/02/12   Pattricia Boss, MD    Physical Exam: Filed Vitals:   09/02/15 0119 09/02/15 0140 09/02/15 0200 09/02/15 0233  BP: 128/57  123/58 123/75  Pulse: 99  98 99  Temp: 99.3 F (37.4 C) 99.2 F (37.3 C)  99.2 F (37.3 C)  TempSrc: Oral Oral  Oral  Resp: 22 20  22   Height:  5\' 4"  (1.626 m)  Weight:    198 lb 12.8 oz (90.175 kg)  SpO2: 99%  92% 100%      Constitutional: Not in distress. Filed Vitals:   09/02/15 0119 09/02/15 0140 09/02/15 0200 09/02/15 0233  BP: 128/57  123/58 123/75  Pulse: 99  98 99  Temp: 99.3 F (37.4 C) 99.2 F (37.3 C)  99.2 F (37.3 C)  TempSrc: Oral Oral  Oral  Resp: 22 20  22   Height:    5\' 4"  (1.626 m)  Weight:    198 lb 12.8 oz (90.175 kg)  SpO2: 99%  92% 100%   Eyes: Anicteric mild pallor. ENMT: No discharge from the ears eyes nose and mouth. Neck: No mass felt. No JVD  appreciated. Respiratory: No rhonchi or crepitations. Cardiovascular: S1-S2 heard. Abdomen: Mild tenderness diffusely. No guarding or rigidity. Musculoskeletal: No edema. Skin: No rash. Neurologic: Alert awake oriented to time place and person. Moves all extremities. Psychiatric: Appears normal.   Labs on Admission: I have personally reviewed following labs and imaging studies  CBC:  Recent Labs Lab 09/01/15 2321  WBC 11.9*  NEUTROABS 8.6*  HGB 3.8*  HCT 14.6*  MCV 53.5*  PLT [id number]   Basic Metabolic Panel:  Recent Labs Lab 09/01/15 2321  NA 132*  K 3.3*  CL 98*  CO2 25  GLUCOSE 184*  BUN 11  CREATININE 1.25*  CALCIUM 8.4*   GFR: Estimated Creatinine Clearance: 64.4 mL/min (by C-G formula based on Cr of 1.25). Liver Function Tests: No results for input(s): AST, ALT, ALKPHOS, BILITOT, PROT, ALBUMIN in the last 168 hours. No results for input(s): LIPASE, AMYLASE in the last 168 hours. No results for input(s): AMMONIA in the last 168 hours. Coagulation Profile: No results for input(s): INR, PROTIME in the last 168 hours. Cardiac Enzymes: No results for input(s): CKTOTAL, CKMB, CKMBINDEX, TROPONINI in the last 168 hours. BNP (last 3 results) No results for input(s): PROBNP in the last 8760 hours. HbA1C: No results for input(s): HGBA1C in the last 72 hours. CBG:  Recent Labs Lab 09/02/15 0323  GLUCAP 178*   Lipid Profile: No results for input(s): CHOL, HDL, LDLCALC, TRIG, CHOLHDL, LDLDIRECT in the last 72 hours. Thyroid Function Tests: No results for input(s): TSH, T4TOTAL, FREET4, T3FREE, THYROIDAB in the last 72 hours. Anemia Panel: No results for input(s): VITAMINB12, FOLATE, FERRITIN, TIBC, IRON, RETICCTPCT in the last 72 hours. Urine analysis:    Component Value Date/Time   COLORURINE RED* 09/01/2015 2232   APPEARANCEUR TURBID* 09/01/2015 2232   LABSPEC 1.019 09/01/2015 2232   PHURINE 5.0 09/01/2015 2232   GLUCOSEU NEGATIVE 09/01/2015 2232   HGBUR  SMALL* 09/01/2015 2232   BILIRUBINUR MODERATE* 09/01/2015 2232   KETONESUR 40* 09/01/2015 2232   PROTEINUR 100* 09/01/2015 2232   UROBILINOGEN 0.2 09/02/2010 0053   NITRITE POSITIVE* 09/01/2015 2232   LEUKOCYTESUR LARGE* 09/01/2015 2232   Sepsis Labs: @LABRCNTIP (procalcitonin:4,lacticidven:4) )No results found for this or any previous visit (from the past 240 hour(s)).   Radiological Exams on Admission: Ct Renal Stone Study  09/02/2015  CLINICAL DATA:  Left flank and abdominal pain, acute onset. Right blood cells and [place] blood cells in the urine. Initial encounter. EXAM: CT ABDOMEN AND PELVIS WITHOUT CONTRAST TECHNIQUE: Multidetector CT imaging of the abdomen and pelvis was performed following the standard protocol without IV contrast. COMPARISON:  Pelvic ultrasound performed 01/21/2015 FINDINGS: The visualized lung bases are clear. The liver is unremarkable in appearance. The spleen is mildly enlarged, measuring 13.7  cm in length. The gallbladder is within normal limits. The pancreas and adrenal glands are unremarkable. There is relatively severe chronic right-sided hydronephrosis, with diffuse dilatation of the right ureter. No distal obstructing stone is seen. This may reflect mass effect from the patient's hydrosalpinges. The left kidney is grossly unremarkable in appearance. Nonspecific perinephric stranding is noted bilaterally. No nonobstructing renal stones are identified. Prominent retroperitoneal nodes measure up to 1.2 cm in short axis. These are difficult to fully assess without contrast. No free fluid is identified. The small bowel is unremarkable in appearance. The stomach is within normal limits. No acute vascular abnormalities are seen. Minimal vascular calcifications are seen. The appendix is normal in caliber, without evidence of appendicitis. The colon is grossly unremarkable in appearance. The bladder is mildly distended and grossly unremarkable. An apparent 5 cm fibroid is noted  within the uterus. A likely exophytic fibroid is seen on the right. Prominent bilateral tubular cystic structures are noted at the pelvis, which may reflect recurrent bilateral hydrosalpinges, left larger than right. The ovaries are not well assessed. No inguinal lymphadenopathy is seen. No acute osseous abnormalities are identified. IMPRESSION: 1. Prominent retroperitoneal nodes measure up to 1.2 cm in short axis. These are difficult to fully assess without contrast. Malignancy cannot be entirely excluded. Would correlate with the patient's history, and consider contrast-enhanced CT or PET/CT when and as deemed clinically appropriate. 2. Relatively severe chronic right-sided hydronephrosis, with diffuse dilatation of the right ureter. This may reflect mild mass-effect from the patient's apparent hydrosalpinges. No obstructing stone seen. 3. Prominent bilateral tubular cystic structures at the pelvis, which may reflect recurrent bilateral hydrosalpinges, left larger than right. This could be further assessed on pelvic ultrasound, or on the follow-up study mentioned above. 4. Uterine fibroids again noted. 5. Mild splenomegaly. Electronically Signed   By: Garald Balding M.D.   On: 09/02/2015 00:48     Assessment/Plan Principal Problem:   Acute blood loss anemia Active Problems:   UTI (lower urinary tract infection)   Diabetes mellitus type 2, controlled (HCC)   Essential hypertension   Abdominal pain   Anemia    #1. Severe microcytic hypochromic anemia secondary to severe menorrhagia - patient has known history of chronic iron deficiency anemia with severe menorrhagia. Patient denies any blood in the stools. Patient has received previously IV iron infusions. At this time patient will be receiving 2 units of packed red blood cell following which repeat hemoglobin patient may need 1 more unit. #2. Abdominal pain with bilateral hydrosalpinx and right-sided hydronephrosis with hydroureter - will discuss  with gynecologist and urologist. Check pelvic ultrasound. Addendum - I did discuss with on-call gynecologist Dr. Kennon Rounds and on-call urologist Dr. Jeffie Pollock who will be seeing patient in consult for further recommendations. #3. Diabetes mellitus type 2 - continue home dose of Levemir and sliding scale coverage. #4. Hypertension - continue lisinopril. Patient's creatinine is mildly elevated and may need to hold lisinopril if creatinine worsens.   DVT prophylaxis: SCDs. Code Status: Full code.  Family Communication: No family at the bedside.  Disposition Plan: Home.  Consults called: Urologist and gynecologist.  Admission status: Observation. May need to change to inpatient if procedure is anticipated.    [name] MD [facility] Pager [phone].  If 7PM-7AM, please contact night-coverage [email or web address] Password [facility]  09/02/2015, 3:33 AM

## 2015-09-02 NOTE — Sedation Documentation (Signed)
200 cc of purulent drainage from site

## 2015-09-02 NOTE — Procedures (Signed)
Interventional Radiology Procedure Note  Procedure: Placement of a 53F drain into LEFT pelvic fluid collection with aspiration of 200 mL purulent fluid.  Sample sent for Cx.  Tube left to JP bulb suction.   Complications: None  Estimated Blood Loss: None  Recommendations: - Tube to suction - Cx pending  Signed,  Criselda Peaches, MD

## 2015-09-02 NOTE — Consult Note (Signed)
Subjective: CC: right hydronephrosis.  Hx: Amy Lowe is a 40 yo female who I was asked to see by Dr. Candiss Norse for a UTI and right hydronephrosis.    She was admitted with LLQ pain and acute on chronic blood loss anemia from menorrhagia.   She had a CT scan which showed moderately severe right hydroureteronephrosis with the ureter dilated to the pelvis.   She has a large uterus with fibroids and chronic bilateral hydrosalpinx.  She has a nit+ urine with TNTC WBC and many bacteria but no flank pain and only a low grade fever of 99.   She has a mildly elevated WBC and Cr.   Her Hgb on admission was 3.8.   She had an exploratory laparoscopy in 2010 by Dr. 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   She had a hysteroscopy with fibroid resection and an attempted ablation in [place] last October.   She currently complains of left pelvic pain that is severe.   She has frequency of urination but no dysuria or hematuria.  ROS:  Review of Systems  Constitutional: Positive for malaise/fatigue. Negative for fever and chills.  Eyes: Negative.   Respiratory: Positive for shortness of breath. Negative for cough.   Cardiovascular: Positive for leg swelling (knees). Negative for chest pain and palpitations.  Gastrointestinal: Positive for abdominal pain (left suprapubic/pelvic pain) and constipation.  Genitourinary: Positive for frequency. Negative for dysuria, urgency, hematuria and flank pain.  Musculoskeletal: Negative.   Neurological: Positive for weakness. Negative for headaches.  Endo/Heme/Allergies: Negative.   Psychiatric/Behavioral: Negative.   All other systems reviewed and are negative.   Allergies  Allergen Reactions  . Penicillins Anaphylaxis    Has patient had a PCN reaction causing immediate rash, facial/tongue/throat swelling, SOB or lightheadedness with hypotension: Yes Has patient had a PCN reaction causing severe rash involving mucus membranes or  skin necrosis: No Has patient had a PCN reaction that required hospitalization No Has patient had a PCN reaction occurring within the last 10 years: Yes If all of the above answers are "NO", then may proceed with Cephalosporin use.  . Iodinated Diagnostic Agents Other (See Comments)    "burning feeling"    Past Medical History  Diagnosis Date  . Diabetes mellitus   . Anemia     Past Surgical History  Procedure Laterality Date  . Abdominal laparascopy    . Dilation and curettage of uterus    . Hernia repair    . Dnc      Social History   Social History  . Marital Status: Married    Spouse Name: N/A  . Number of Children: N/A  . Years of Education: N/A   Occupational History  . Not on file.   Social History Main Topics  . Smoking status: Current Some Day Smoker    Types: Cigarettes  . Smokeless tobacco: Not on file  . Alcohol Use: No  . Drug Use: No  . Sexual Activity: Yes   Other Topics Concern  . Not on file   Social History Narrative    Family History  Problem Relation Age of Onset  . Lung cancer Father   . Diabetes Mellitus II Maternal Grandmother     Anti-infectives: Anti-infectives    Start     Dose/Rate Route Frequency Ordered Stop   09/02/15 0045  levofloxacin (LEVAQUIN) IVPB 500 mg     500 mg 100 mL/hr over 60 Minutes Intravenous  Once  09/02/15 0039 09/02/15 0336      Current Facility-Administered Medications  Medication Dose Route Frequency Provider Last Rate Last Dose  . 0.9 %  sodium chloride infusion   Intravenous Continuous [name], MD 50 mL/hr at 09/02/15 0353    . acetaminophen (TYLENOL) tablet 650 mg  650 mg Oral Q6H PRN [name], MD       Or  . acetaminophen (TYLENOL) suppository 650 mg  650 mg Rectal Q6H PRN [name], MD      . HYDROcodone-acetaminophen (NORCO/VICODIN) 5-325 MG per tablet 1 tablet  1 tablet Oral Q6H PRN [name], MD   1 tablet at 09/02/15 0353  . insulin aspart (novoLOG)  injection 0-9 Units  0-9 Units Subcutaneous TID WC [name], MD   5 Units at 09/02/15 [phone]  . insulin aspart (novoLOG) injection 8 Units  8 Units Subcutaneous TID WC [name], MD   8 Units at 09/02/15 [phone]  . insulin detemir (LEVEMIR) injection 18 Units  18 Units Subcutaneous BID [name], MD   18 Units at 09/02/15 0427  . lisinopril (PRINIVIL,ZESTRIL) tablet 10 mg  10 mg Oral Daily [name], MD      . ondansetron [facility]) tablet 4 mg  4 mg Oral Q6H PRN [name], MD       Or  . ondansetron [facility]) injection 4 mg  4 mg Intravenous Q6H PRN [name], MD   4 mg at 09/02/15 0353  . potassium chloride SA (K-DUR,KLOR-CON) CR tablet 40 mEq  40 mEq Oral Q6H [name], MD   40 mEq at 09/02/15 [id number]  . tranexamic acid (LYSTEDA) tablet 650 mg  650 mg Oral TID [name], MD       Past, social and family history reviewed.   Objective: Vital signs in last 24 hours: Temp:  [98.8 F (37.1 C)-99.3 F (37.4 C)] 99.3 F (37.4 C) (05/29 0705) Pulse Rate:  [93-113] 93 (05/29 0705) Resp:  [18-22] 20 (05/29 0705) BP: (123-144)/(52-75) 132/57 mmHg (05/29 0705) SpO2:  [92 %-100 %] 100 % (05/29 0705) Weight:  [90.175 kg (198 lb 12.8 oz)-97.523 kg (215 lb)] 90.175 kg (198 lb 12.8 oz) (05/29 0233)  Intake/Output from previous day: 05/28 0701 - 05/29 0700 In: 362.1 [Blood:362.1] Out: 600 [Urine:600] Intake/Output this shift: Total I/O In: 369.6 [Blood:369.6] Out: -    Physical Exam  Constitutional: She is oriented to person, place, and time.  WD, WN in obvious discomfort  HENT:  Head: Normocephalic and atraumatic.  Neck: Normal range of motion. Neck supple. No thyromegaly present.  Cardiovascular: Normal rate, regular rhythm and normal heart sounds.   Pulmonary/Chest: Effort normal and breath sounds normal. No respiratory distress.  Abdominal: Soft. She exhibits no distension and no mass. There is tenderness (severe in the  left SP area with some central SP tenderness.   She has no CVAT or RUQ tenderness). There is guarding.  Musculoskeletal: Normal range of motion. She exhibits no edema or tenderness.  Lymphadenopathy:    She has no cervical adenopathy.  Neurological: She is alert and oriented to person, place, and time.  Skin: Skin is warm and dry. No rash noted. No erythema.  Psychiatric: Affect normal.  Mood appears depressed.   Vitals reviewed.   Lab Results:   Recent Labs  09/01/15 2321  WBC 11.9*  HGB 3.8*  HCT 14.6*  PLT 272   BMET  Recent Labs  09/01/15 2321  NA 132*  K 3.3*  CL 98*  CO2 25  GLUCOSE 184*  BUN 11  CREATININE 1.25*  CALCIUM 8.4*   PT/INR No results for input(s): LABPROT, INR in the last 72 hours. ABG No results for input(s): PHART, HCO3 in the last 72 hours.  Invalid input(s): PCO2, PO2  Studies/Results: Ct Renal Stone Study  09/02/2015  CLINICAL DATA:  Left flank and abdominal pain, acute onset. Right blood cells and Barbero blood cells in the urine. Initial encounter. EXAM: CT ABDOMEN AND PELVIS WITHOUT CONTRAST TECHNIQUE: Multidetector CT imaging of the abdomen and pelvis was performed following the standard protocol without IV contrast. COMPARISON:  Pelvic ultrasound performed 01/21/2015 FINDINGS: The visualized lung bases are clear. The liver is unremarkable in appearance. The spleen is mildly enlarged, measuring 13.7 cm in length. The gallbladder is within normal limits. The pancreas and adrenal glands are unremarkable. There is relatively severe chronic right-sided hydronephrosis, with diffuse dilatation of the right ureter. No distal obstructing stone is seen. This may reflect mass effect from the patient's hydrosalpinges. The left kidney is grossly unremarkable in appearance. Nonspecific perinephric stranding is noted bilaterally. No nonobstructing renal stones are identified. Prominent retroperitoneal nodes measure up to 1.2 cm in short axis. These are  difficult to fully assess without contrast. No free fluid is identified. The small bowel is unremarkable in appearance. The stomach is within normal limits. No acute vascular abnormalities are seen. Minimal vascular calcifications are seen. The appendix is normal in caliber, without evidence of appendicitis. The colon is grossly unremarkable in appearance. The bladder is mildly distended and grossly unremarkable. An apparent 5 cm fibroid is noted within the uterus. A likely exophytic fibroid is seen on the right. Prominent bilateral tubular cystic structures are noted at the pelvis, which may reflect recurrent bilateral hydrosalpinges, left larger than right. The ovaries are not well assessed. No inguinal lymphadenopathy is seen. No acute osseous abnormalities are identified. IMPRESSION: 1. Prominent retroperitoneal nodes measure up to 1.2 cm in short axis. These are difficult to fully assess without contrast. Malignancy cannot be entirely excluded. Would correlate with the patient's history, and consider contrast-enhanced CT or PET/CT when and as deemed clinically appropriate. 2. Relatively severe chronic right-sided hydronephrosis, with diffuse dilatation of the right ureter. This may reflect mild mass-effect from the patient's apparent hydrosalpinges. No obstructing stone seen. 3. Prominent bilateral tubular cystic structures at the pelvis, which may reflect recurrent bilateral hydrosalpinges, left larger than right. This could be further assessed on pelvic ultrasound, or on the follow-up study mentioned above. 4. Uterine fibroids again noted. 5. Mild splenomegaly. Electronically Signed   By: Garald Balding M.D.   On: 09/02/2015 00:48    I have discussed her case with Dr. Candiss Norse.   I have reviewed her labs and CT films and reports along with prior pelvic US reports.    I have reviewed her prior op note from Dr. Radene Knee in 2010.   I have reviewed records from [place] from her endocrinologist and  gynecologist.    Assessment: Right hydronephrosis which appears to be secondary to her gynecologic anomalies.   She is currently asymptomatic with the hydronephrosis and is difficult to say if this is an acute or chronic process.  Possible UTI with minimal symptoms and there is no evidence to suggest right pyelonephritis so acute stenting is no indicated at this time.    Left pelvic pain that is probably secondary to her hydrosalpinx.  Acute on Chronic anemia from menorrhagia.  I  discussed the right ureteral obstruction and the indications for intervention with either cystoscopy and stent or placement of a perc tube.   Because she is asymptomatic with no flank pain I don't think she needs acute stenting despite the apparent UTI.    She has not decided on definitive therapy for her gynecologic issues and I am concerned that if we place a stent she will be committed to chronic stent exchanges.     At this time I would recommend treatment of the UTI and anemia.   Consider percutaneous aspiration of the left hydrosalpinx.   I would also recommend a nuclear renogram with lasix washout to further assess the degree of obstruction.    I think she will be best served by eventual definitive management of her bleeding and hydrosalpinx with surgical therapy.     CC: Dr. Lala Lund.      Alyas Creary J 09/02/2015 [phone]

## 2015-09-02 NOTE — Consult Note (Signed)
Chief Complaint: Patient was seen in consultation today for  Chief Complaint  Patient presents with  . Flank Pain  . Abdominal Pain   at the request of Lala Lund  Referring Physician(s): Dr. Candiss Norse   Patient Status: In-pt / Out-pt  History of Present Illness: Amy Lowe is a 40 y.o. female who was admitted yesterday with increasing LLQ abdominal pain over several days.  Specifically denies right flank or RLQ pain.  Noted to have UTI and anemia (chronic) on admission. Has known chronic PID, infertility, and previous attempt at laparoscopy removal of tubes in 2012 by Dr. Radene Knee and which could not be safely done due to adhesion.  She is also noted to have marked right sided hydronephrosis although this has been evaluated by Dr. Jeffie Pollock who found her asymptomatic.  Given the location of her discomfort, she likely has superinfection of her chronic left hydrosalpinx.  IR consulted for percutaneous drain placement.    Past Medical History  Diagnosis Date  . Diabetes mellitus   . Anemia     Past Surgical History  Procedure Laterality Date  . Abdominal laparascopy    . Dilation and curettage of uterus    . Hernia repair    . Dnc      Allergies: Penicillins and Iodinated diagnostic agents  Medications: Prior to Admission medications   Medication Sig Start Date End Date Taking? Authorizing Provider  diphenhydrAMINE (BENADRYL) 25 MG tablet Take 25 mg by mouth every 6 (six) hours as needed. For allergy symptom relief   Yes Historical Provider, MD  HYDROcodone-acetaminophen (NORCO/VICODIN) 5-325 MG tablet Take 1 tablet by mouth every 6 (six) hours as needed for moderate pain.   Yes Historical Provider, MD  ibuprofen (ADVIL,MOTRIN) 200 MG tablet Take 600 mg by mouth every 6 (six) hours as needed. For pain relief   Yes Historical Provider, MD  insulin detemir (LEVEMIR) 100 UNIT/ML injection Inject 18 Units into the skin 2 (two) times daily.   Yes Historical Provider, MD    insulin lispro (HUMALOG) 100 UNIT/ML injection Inject 8 Units into the skin 3 (three) times daily before meals.   Yes Historical Provider, MD  lisinopril (PRINIVIL,ZESTRIL) 10 MG tablet Take 10 mg by mouth daily.   Yes Historical Provider, MD  tranexamic acid (LYSTEDA) 650 MG TABS tablet Take 1 tablet by mouth 3 (three) times daily. Take two tablets three times daily during the first five days of menses. 08/22/15  Yes Historical Provider, MD  clindamycin (CLEOCIN) 150 MG capsule 2 PO TID Patient not taking: Reported on 01/20/2015 10/15/11   Dalia Heading, PA-C  oxyCODONE-acetaminophen (PERCOCET/ROXICET) 5-325 MG per tablet Take 1 tablet by mouth every 4 (four) hours as needed for pain. Patient not taking: Reported on 01/20/2015 08/02/12   Pattricia Boss, MD     Family History  Problem Relation Age of Onset  . Lung cancer Father   . Diabetes Mellitus II Maternal Grandmother     Social History   Social History  . Marital Status: Married    Spouse Name: N/A  . Number of Children: N/A  . Years of Education: N/A   Social History Main Topics  . Smoking status: Current Some Day Smoker    Types: Cigarettes  . Smokeless tobacco: None  . Alcohol Use: No  . Drug Use: No  . Sexual Activity: Yes   Other Topics Concern  . None   Social History Narrative    Review of Systems: A 12 point ROS discussed  and pertinent positives are indicated in the HPI above.  All other systems are negative.  Review of Systems  Vital Signs: BP 128/57 mmHg  Pulse 88  Temp(Src) 99.1 F (37.3 C) (Oral)  Resp 22  Ht 5\' 4"  (1.626 m)  Wt 198 lb 12.8 oz (90.175 kg)  BMI 34.11 kg/m2  SpO2 100%  LMP 08/23/2015  Physical Exam  Constitutional: She is oriented to person, place, and time. She appears well-developed and well-nourished.  HENT:  Head: Normocephalic and atraumatic.  Eyes: No scleral icterus.  Cardiovascular: Normal rate and regular rhythm.   Pulmonary/Chest: Effort normal.  Abdominal: Soft.  There is tenderness.  Neurological: She is alert and oriented to person, place, and time.  Skin: Skin is warm and dry.  Psychiatric: She has a normal mood and affect. Her behavior is normal.  Vitals reviewed.    Imaging: Ct Renal Stone Study  09/02/2015  CLINICAL DATA:  Left flank and abdominal pain, acute onset. Right blood cells and Sammon blood cells in the urine. Initial encounter. EXAM: CT ABDOMEN AND PELVIS WITHOUT CONTRAST TECHNIQUE: Multidetector CT imaging of the abdomen and pelvis was performed following the standard protocol without IV contrast. COMPARISON:  Pelvic ultrasound performed 01/21/2015 FINDINGS: The visualized lung bases are clear. The liver is unremarkable in appearance. The spleen is mildly enlarged, measuring 13.7 cm in length. The gallbladder is within normal limits. The pancreas and adrenal glands are unremarkable. There is relatively severe chronic right-sided hydronephrosis, with diffuse dilatation of the right ureter. No distal obstructing stone is seen. This may reflect mass effect from the patient's hydrosalpinges. The left kidney is grossly unremarkable in appearance. Nonspecific perinephric stranding is noted bilaterally. No nonobstructing renal stones are identified. Prominent retroperitoneal nodes measure up to 1.2 cm in short axis. These are difficult to fully assess without contrast. No free fluid is identified. The small bowel is unremarkable in appearance. The stomach is within normal limits. No acute vascular abnormalities are seen. Minimal vascular calcifications are seen. The appendix is normal in caliber, without evidence of appendicitis. The colon is grossly unremarkable in appearance. The bladder is mildly distended and grossly unremarkable. An apparent 5 cm fibroid is noted within the uterus. A likely exophytic fibroid is seen on the right. Prominent bilateral tubular cystic structures are noted at the pelvis, which may reflect recurrent bilateral  hydrosalpinges, left larger than right. The ovaries are not well assessed. No inguinal lymphadenopathy is seen. No acute osseous abnormalities are identified. IMPRESSION: 1. Prominent retroperitoneal nodes measure up to 1.2 cm in short axis. These are difficult to fully assess without contrast. Malignancy cannot be entirely excluded. Would correlate with the patient's history, and consider contrast-enhanced CT or PET/CT when and as deemed clinically appropriate. 2. Relatively severe chronic right-sided hydronephrosis, with diffuse dilatation of the right ureter. This may reflect mild mass-effect from the patient's apparent hydrosalpinges. No obstructing stone seen. 3. Prominent bilateral tubular cystic structures at the pelvis, which may reflect recurrent bilateral hydrosalpinges, left larger than right. This could be further assessed on pelvic ultrasound, or on the follow-up study mentioned above. 4. Uterine fibroids again noted. 5. Mild splenomegaly. Electronically Signed   By: Garald Balding M.D.   On: 09/02/2015 00:48    Labs:  CBC:  Recent Labs  01/20/15 2249 09/01/15 2321 09/02/15 0933  WBC 5.9 11.9* 13.5*  HGB 11.4* 3.8* 6.0*  HCT 35.8* 14.6* 21.2*  PLT 255 272 280    COAGS:  Recent Labs  09/02/15 1050  INR  1.38    BMP:  Recent Labs  01/20/15 2249 09/01/15 2321 09/02/15 0933  NA 137 132* 133*  K 4.1 3.3* 3.7  CL 108 98* 97*  CO2 23 25 25   GLUCOSE 124* 184* 171*  BUN 13 11 10   CALCIUM 8.9 8.4* 8.7*  CREATININE 1.30* 1.25* 1.11*  GFRNONAA 51* 53* >60  GFRAA 59* >60 >60    LIVER FUNCTION TESTS: No results for input(s): BILITOT, AST, ALT, ALKPHOS, PROT, ALBUMIN in the last 8760 hours.  TUMOR MARKERS: No results for input(s): AFPTM, CEA, CA199, CHROMGRNA in the last 8760 hours.  Assessment and Plan:  Probable acute infection of chronic left hydrosalpinx.  Risks including bleeding, incomplete drainage and damage to nearby structures including the nerves, blood  vessels and rectum were discussed.  She understands and desires to proceed.  1.) To CT for percutaneous drainage.   Thank you for this interesting consult.  I greatly enjoyed meeting Amy Lowe and look forward to participating in their care.  A copy of this report was sent to the requesting provider on this date.  Electronically Signed: Jacqulynn Cadet 09/02/2015, 1:36 PM

## 2015-09-02 NOTE — Progress Notes (Signed)
Pharmacy Antibiotic Note  Amy Lowe is a 40 y.o. female admitted on 09/01/2015 with Chronic PID.  Pharmacy has been consulted for gentamicin dosing.  Gentamicin charted at 1125 5/29, gentamicin level drawn at 1947 5/29--8 hr 72mins in between. Using [place] nomogram, the level of 8.3 lies within the q36hr dosing area.  Plan: Change gentamicin dosing to q36hr dosing with next dose at 0000 5/31.  Recheck levels at least weekly or with renal function change.  Height: 5\' 4"  (162.6 cm) Weight: 198 lb 12.8 oz (90.175 kg) IBW/kg (Calculated) : 54.7  Temp (24hrs), Avg:99.8 F (37.7 C), Min:98.8 F (37.1 C), Max:102.9 F (39.4 C)   Recent Labs Lab 09/01/15 2321 09/02/15 0933 09/02/15 1947  WBC 11.9* 13.5*  --   CREATININE 1.25* 1.11*  --   GENTRANDOM  --   --  8.3    Estimated Creatinine Clearance: 72.5 mL/min (by C-G formula based on Cr of 1.11).    Allergies  Allergen Reactions  . Penicillins Anaphylaxis    Has patient had a PCN reaction causing immediate rash, facial/tongue/throat swelling, SOB or lightheadedness with hypotension:  Has patient had a PCN reaction causing severe rash involving mucus membranes or skin necrosis:  Has patient had a PCN reaction that required hospitalization  Has patient had a PCN reaction occurring within the last 10 years:  If all of the above answers are "NO", then may proceed with Cephalosporin use.  . Iodinated Diagnostic Agents Other (See Comments)    "burning feeling"    Antimicrobials this admission: Gentamicin 5/29 >> Clindamycin 5/29  Dose adjustments this admission: 5/29 "10hr" gent level 8.3--change to gentamicin q36hr dosing  Microbiology results: pending  Thank you for allowing pharmacy to be a part of this patient's care.  Nani Skillern Crowford 09/02/2015 10:14 PM

## 2015-09-02 NOTE — ED Notes (Signed)
MD Roxanne Mins made aware of critical Hgb of 3.8

## 2015-09-02 NOTE — Progress Notes (Signed)
Patient refuses to have additional units of blood administered at this time. MD made aware

## 2015-09-02 NOTE — Sedation Documentation (Signed)
Patient is resting comfortably. 

## 2015-09-02 NOTE — Consult Note (Signed)
[facility]  Impression: Principal Problem:   Acute blood loss anemia Active Problems:   UTI (lower urinary tract infection)   Diabetes mellitus type 2, controlled (HCC)   Essential hypertension   Abdominal pain   Hydrosalpinx   Chronic PID (chronic pelvic inflammatory disease)   Fibroid uterus    Recommendations: 1.  Discussed with IR possibility of draining her hydrosalpinx on the left due to her pain being on that side--they will set that up. Drainage of the right hydrosalpinx is not really possible. 2.  She is not a candidate for IR Uterine fibroid embolization due to hydrosalpinges. 3.  Would broaden abx coverage for superimposed acute on chronic PID-given PCN allergy will use Gent and clinda--discussed with pharmacy 4.  Needs pelvic clean out and hysterectomy, but patient is quite resistant to that. Has private OB/GYN in [facility] at [place] and may f/u with them post hospitalization 5.  Urology has evaluated her and she does not need acute decompression of her right hydronephrosis-Not obviously pyelonephritis based on no CVA tenderness, no high fever 6.  Given the degree of anemia, would give her 5 units total of PRBC's. Patient is resistant to more blood and would like IV iron instead-will leave this to primary team 7.  Add Megace to Tranexamic acid to control bleeding, if that becomes a further issue-not a good cadidate for estrogen given co-existing medical issues 8. Pain meds as needed-added IV Dilaudid prn 9. Will d/c levaquin and add Gent and Clinda--the gent will cover typical urinary pathogens 10. Glycemic control  Reason for consult: Patient is a 40 y.o. G33 female who was admitted with increasing abdominal pain over the last few days. She denies fever, dysuria. Noted to have UTI on admission. She is found to be profoundly anemic with fibroid uterus. Has known chronic PID, infertility, and previous attempt at laparoscopy removal of  tubes in 2012 by Dr. Radene Knee and which could not be safely done due to adhesion. She is also noted to have marked right sided hydronephrosis  We are asked to see the patient regarding pain, hydronephrosis and vaginal bleeding. She asks how getting a kidney infection leads to her needing hysterectomy when I enter the room. She reports everyone in her family has needed a hysterectomy and they all have complications following this and she does not want this. She has chronic anemia and has had IV iron in the past which worked for a short while, but her bleeding has continued. She is on Tranexamic acid, but this does not help. Has had 2 cycles rather close together which were heavy. Notes chronic fatigue and DOE.  Past Medical History  Diagnosis Date  . Diabetes mellitus   . Anemia     Past Surgical History  Procedure Laterality Date  . Abdominal laparascopy    . Dilation and curettage of uterus    . Hernia repair    . Dnc      Family History  Problem Relation Age of Onset  . Lung cancer Father   . Diabetes Mellitus II Maternal Grandmother     Social History   Social History  . Marital Status: Married    Spouse Name: N/A  . Number of Children: N/A  . Years of Education: N/A   Occupational History  . Not on file.   Social History Main Topics  . Smoking status: Current Some Day Smoker    Types: Cigarettes  . Smokeless tobacco: Not on file  .  Alcohol Use: No  . Drug Use: No  . Sexual Activity: Yes   Other Topics Concern  . Not on file   Social History Narrative    . insulin aspart  0-9 Units Subcutaneous TID WC  . insulin aspart  8 Units Subcutaneous TID WC  . insulin detemir  18 Units Subcutaneous BID  . lisinopril  10 mg Oral Daily  . potassium chloride  40 mEq Oral Q6H  . tranexamic acid  650 mg Oral TID    Allergies  Allergen Reactions  . Penicillins Anaphylaxis    Has patient had a PCN reaction causing immediate rash, facial/tongue/throat swelling, SOB or  lightheadedness with hypotension: Yes Has patient had a PCN reaction causing severe rash involving mucus membranes or skin necrosis: Yes Has patient had a PCN reaction that required hospitalization No Has patient had a PCN reaction occurring within the last 10 years: No If all of the above answers are "NO", then may proceed with Cephalosporin use.  . Iodinated Diagnostic Agents Other (See Comments)    "burning feeling"    Review of Systems - Negative except as outlined in the HPI  Exam Filed Vitals:   09/02/15 0422 09/02/15 0705  BP: 142/65 132/57  Pulse: 100 93  Temp: 99.2 F (37.3 C) 99.3 F (37.4 C)  Resp: 22 20    Physical Examination: General appearance - overweight, anxious, chronically ill appearing and cannot get comfortable in bed Neck - supple, no significant adenopathy Chest - normal effort Heart - normal rate and regular rhythm Abdomen - soft, no mass noted, exquisitely tender to palpation LLQ and midline> right sided, no rebound Back exam - no CVA tenderness Neurological - alert, oriented, normal speech, no focal findings or movement disorder noted Extremities - Homan's sign negative bilaterally Skin - normal coloration and turgor, no rashes, no suspicious skin lesions noted  Labs:  CBC    Component Value Date/Time   WBC 11.9* 09/01/2015 2321   RBC 2.73* 09/01/2015 2321   HGB 3.8* 09/01/2015 2321   HCT 14.6* 09/01/2015 2321   PLT 272 09/01/2015 2321   MCV 53.5* 09/01/2015 2321   MCH 13.9* 09/01/2015 2321   MCHC 26.0* 09/01/2015 2321   RDW 21.5* 09/01/2015 2321   LYMPHSABS 1.6 09/01/2015 2321   MONOABS 1.6* 09/01/2015 2321   EOSABS 0.0 09/01/2015 2321   BASOSABS 0.0 09/01/2015 2321    CMP     Component Value Date/Time   NA 132* 09/01/2015 2321   K 3.3* 09/01/2015 2321   CL 98* 09/01/2015 2321   CO2 25 09/01/2015 2321   GLUCOSE 184* 09/01/2015 2321   BUN 11 09/01/2015 2321   CREATININE 1.25* 09/01/2015 2321   CALCIUM 8.4* 09/01/2015 2321    PROT 7.3 09/01/2010 2329   ALBUMIN 4.3 09/01/2010 2329   AST 21 09/01/2010 2329   ALT 14 09/01/2010 2329   ALKPHOS 77 09/01/2010 2329   BILITOT 0.2* 09/01/2010 2329   GFRNONAA 53* 09/01/2015 2321   GFRAA >60 09/01/2015 2321    Lab Results  Component Value Date   TSH 2.227 03/13/2008    No components found for: URINALYSIS  Lab Results  Component Value Date   PREGTESTUR NEGATIVE 09/01/2015    Radiological Studies Ct Renal Stone Study  09/02/2015  CLINICAL DATA:  Left flank and abdominal pain, acute onset. Right blood cells and Alviar blood cells in the urine. Initial encounter. EXAM: CT ABDOMEN AND PELVIS WITHOUT CONTRAST TECHNIQUE: Multidetector CT imaging of the abdomen and pelvis  was performed following the standard protocol without IV contrast. COMPARISON:  Pelvic ultrasound performed 01/21/2015 FINDINGS: The visualized lung bases are clear. The liver is unremarkable in appearance. The spleen is mildly enlarged, measuring 13.7 cm in length. The gallbladder is within normal limits. The pancreas and adrenal glands are unremarkable. There is relatively severe chronic right-sided hydronephrosis, with diffuse dilatation of the right ureter. No distal obstructing stone is seen. This may reflect mass effect from the patient's hydrosalpinges. The left kidney is grossly unremarkable in appearance. Nonspecific perinephric stranding is noted bilaterally. No nonobstructing renal stones are identified. Prominent retroperitoneal nodes measure up to 1.2 cm in short axis. These are difficult to fully assess without contrast. No free fluid is identified. The small bowel is unremarkable in appearance. The stomach is within normal limits. No acute vascular abnormalities are seen. Minimal vascular calcifications are seen. The appendix is normal in caliber, without evidence of appendicitis. The colon is grossly unremarkable in appearance. The bladder is mildly distended and grossly unremarkable. An apparent 5 cm  fibroid is noted within the uterus. A likely exophytic fibroid is seen on the right. Prominent bilateral tubular cystic structures are noted at the pelvis, which may reflect recurrent bilateral hydrosalpinges, left larger than right. The ovaries are not well assessed. No inguinal lymphadenopathy is seen. No acute osseous abnormalities are identified. IMPRESSION: 1. Prominent retroperitoneal nodes measure up to 1.2 cm in short axis. These are difficult to fully assess without contrast. Malignancy cannot be entirely excluded. Would correlate with the patient's history, and consider contrast-enhanced CT or PET/CT when and as deemed clinically appropriate. 2. Relatively severe chronic right-sided hydronephrosis, with diffuse dilatation of the right ureter. This may reflect mild mass-effect from the patient's apparent hydrosalpinges. No obstructing stone seen. 3. Prominent bilateral tubular cystic structures at the pelvis, which may reflect recurrent bilateral hydrosalpinges, left larger than right. This could be further assessed on pelvic ultrasound, or on the follow-up study mentioned above. 4. Uterine fibroids again noted. 5. Mild splenomegaly. Electronically Signed   By: Garald Balding M.D.   On: 09/02/2015 00:48    Thank you so much for allowing [place] to participate in the care of this patient.  We will continue to follow with you. Please call the attending OB/GYN physician with questions or concerns at [phone].

## 2015-09-02 NOTE — Sedation Documentation (Signed)
Patient denies pain and is resting comfortably.  

## 2015-09-02 NOTE — Progress Notes (Signed)
Pharmacy Antibiotic Note  Amy Lowe is a 40 y.o. female admitted on 09/01/2015 with acute on chronic PID  Pharmacy has been consulted for gentamicin dosing along with clinda per MD dosing given PCN allergy  Plan: 1) Gentamicin 7mg /kg IV q24 extended interval dosing per protocol 2) Check random gent level 10 hours after gent dose started and will adjust dosing per [place] Nom[facility]  Height: 5\' 4"  (162.6 cm) Weight: 198 lb 12.8 oz (90.175 kg) IBW/kg (Calculated) : 54.7  Temp (24hrs), Avg:99.2 F (37.3 C), Min:98.8 F (37.1 C), Max:99.3 F (37.4 C)   Recent Labs Lab 09/01/15 2321  WBC 11.9*  CREATININE 1.25*    Estimated Creatinine Clearance: 64.4 mL/min (by C-G formula based on Cr of 1.25).    Allergies  Allergen Reactions  . Penicillins Anaphylaxis    Has patient had a PCN reaction causing immediate rash, facial/tongue/throat swelling, SOB or lightheadedness with hypotension: Yes Has patient had a PCN reaction causing severe rash involving mucus membranes or skin necrosis: Yes Has patient had a PCN reaction that required hospitalization No Has patient had a PCN reaction occurring within the last 10 years: Yes If all of the above answers are "NO", then may proceed with Cephalosporin use.  . Iodinated Diagnostic Agents Other (See Comments)    "burning feeling"     Thank you for allowing pharmacy to be a part of this patient's care.   Adrian Saran, PharmD, BCPS Pager [phone] 09/02/2015 10:21 AM

## 2015-09-02 NOTE — Progress Notes (Signed)
PROGRESS NOTE                                                                                                                                                                                                             Patient Demographics:    Amy Lowe, is a 40 y.o. female, DOB - [birth year], [id number]  Admit date - 09/01/2015   Admitting Physician [name], MD  Outpatient Primary MD for the patient is PROVIDER NOT IN SYSTEM  LOS -   Chief Complaint  Patient presents with  . Flank Pain  . Abdominal Pain       Brief Narrative     Amy Lowe is a 40 y.o. female with medical history significant of with chronic and deficiency anemia previously received IV iron therapy, fibroid uterus, on diabetes mellitus, hypertension presents to the ER because of increasing pain in the abdomen on the left side over the last 2-3 days. The pain was radiating to her left groin. Had some nausea denies any vomiting or diarrhea. Denies any fever chills chest pain or shortness of breath. In the ER patient's hemoglobin was found to be around 3.8 and UA shows patient is consistent with UTI. CT abdomen and pelvis done shows bilateral hydrosalpinx with right-sided hydronephrosis and hydroureter.    Subjective:    Amy Lowe today has, No headache, No chest pain, Positive diffuse small right-sided abdominal pain - No Nausea, No new weakness tingling or numbness, No Cough - SOB    Assessment  & Plan :     1.Abdominal pain due to bilateral hydrosalpinx and chronic right-sided hydronephrosis. Long-standing history of fibroid uterus with menorrhagia. Patient follows with GYN physician Dr. Katherine Mantle, she has been advised to undergo hysterectomy multiple times in the past but she has refused and is currently refusing surgery as well. She and Mr.*and benefits of possible death and disability or stroke.  She has been seen by  both GYN and urology here, no urological procedures, she refuses hysterectomy again, OB has consulted IR for left-sided hydrosalpinx and drainage, she has been placed by GYN on clindamycin and gentamicin for possible PID. Will monitor cultures and clinical response. Continue supportive care with IV fluids and pain control.   2. Severe anemia of chronic disease and acute on chronic. Due to menorrhagia, anemia panel pending,  she is post 2 units of packed RBC transfusion in the ER, refusing further transfusions, IV iron for now. Monitor H&H.  3. Severe menorrhagia. She is currently on Megace to Tranexamic acid per GYN, GYN on case.  4. UTI. For now clindamycin and gentamicin. Follow cultures. No signs and symptoms of pyelonephritis at this time.   5. DM type II. Currently on Lantus and sliding scale will monitor.  No results found for: HGBA1C CBG (last 3)   Recent Labs  09/02/15 0323 09/02/15 0751  GLUCAP 178* 299*     Code Status :  Full  Family Communication  : None present  Disposition Plan  :  Stay in the hospital likely discharge in the morning  Consults  : OB, urology  Procedures  :    CT abdomen and pelvis showing R.hydronephrosis and hydrosalpinx  DVT Prophylaxis  :    SCDs   Lab Results  Component Value Date   PLT 272 09/01/2015    Inpatient Medications  Scheduled Meds: . clindamycin (CLEOCIN) IV  900 mg Intravenous Q8H  . insulin aspart  0-9 Units Subcutaneous TID WC  . insulin aspart  8 Units Subcutaneous TID WC  . insulin detemir  18 Units Subcutaneous BID  . lisinopril  10 mg Oral Daily  . potassium chloride  40 mEq Oral Q6H  . tranexamic acid  650 mg Oral TID   Continuous Infusions: . sodium chloride 50 mL/hr at 09/02/15 0353   PRN Meds:.acetaminophen **OR** [DISCONTINUED] acetaminophen, HYDROcodone-acetaminophen, HYDROmorphone (DILAUDID) injection, ondansetron **OR** ondansetron (ZOFRAN) IV  Antibiotics  :    Anti-infectives    Start      Dose/Rate Route Frequency Ordered Stop   09/02/15 1130  clindamycin (CLEOCIN) IVPB 900 mg     900 mg 100 mL/hr over 30 Minutes Intravenous Every 8 hours 09/02/15 1001     09/02/15 0045  levofloxacin (LEVAQUIN) IVPB 500 mg     500 mg 100 mL/hr over 60 Minutes Intravenous  Once 09/02/15 0039 09/02/15 0336         Objective:   Filed Vitals:   09/02/15 0233 09/02/15 0350 09/02/15 0422 09/02/15 0705  BP: 123/75 133/58 142/65 132/57  Pulse: 99 99 100 93  Temp: 99.2 F (37.3 C) 98.8 F (37.1 C) 99.2 F (37.3 C) 99.3 F (37.4 C)  TempSrc: Oral Oral Oral Oral  Resp: 22 20 22 20   Height: 5\' 4"  (1.626 m)     Weight: 90.175 kg (198 lb 12.8 oz)     SpO2: 100% 96% 98% 100%    Wt Readings from Last 3 Encounters:  09/02/15 90.175 kg (198 lb 12.8 oz)     Intake/Output Summary (Last 24 hours) at 09/02/15 1017 Last data filed at 09/02/15 0954  Gross per 24 hour  Intake 731.66 ml  Output    900 ml  Net -168.34 ml     Physical Exam  Awake Alert, Oriented X 3, No new F.N deficits, Normal affect [place].AT,PERRAL Supple Neck,No JVD, No cervical lymphadenopathy appriciated.  Symmetrical Chest wall movement, Good air movement bilaterally, CTAB RRR,No Gallops,Rubs or new Murmurs, No Parasternal Heave +ve B.Sounds, Abd Soft, No tenderness, No organomegaly appriciated, No rebound - guarding or rigidity. No Cyanosis, Clubbing or edema, No new Rash or bruise      Data Review:    CBC  Recent Labs Lab 09/01/15 2321  WBC 11.9*  HGB 3.8*  HCT 14.6*  PLT 272  MCV 53.5*  MCH 13.9*  MCHC 26.0*  RDW 21.5*  LYMPHSABS 1.6  MONOABS 1.6*  EOSABS 0.0  BASOSABS 0.0    Chemistries   Recent Labs Lab 09/01/15 2321  NA 132*  K 3.3*  CL 98*  CO2 25  GLUCOSE 184*  BUN 11  CREATININE 1.25*  CALCIUM 8.4*   ------------------------------------------------------------------------------------------------------------------ No results for input(s): CHOL, HDL, LDLCALC, TRIG, CHOLHDL,  LDLDIRECT in the last 72 hours.  No results found for: HGBA1C ------------------------------------------------------------------------------------------------------------------ No results for input(s): TSH, T4TOTAL, T3FREE, THYROIDAB in the last 72 hours.  Invalid input(s): FREET3 ------------------------------------------------------------------------------------------------------------------ No results for input(s): VITAMINB12, FOLATE, FERRITIN, TIBC, IRON, RETICCTPCT in the last 72 hours.  Coagulation profile No results for input(s): INR, PROTIME in the last 168 hours.  No results for input(s): DDIMER in the last 72 hours.  Cardiac Enzymes No results for input(s): CKMB, TROPONINI, MYOGLOBIN in the last 168 hours.  Invalid input(s): CK ------------------------------------------------------------------------------------------------------------------ No results found for: BNP  Micro Results No results found for this or any previous visit (from the past 240 hour(s)).  Radiology Reports Ct Renal Stone Study  09/02/2015  CLINICAL DATA:  Left flank and abdominal pain, acute onset. Right blood cells and Saks blood cells in the urine. Initial encounter. EXAM: CT ABDOMEN AND PELVIS WITHOUT CONTRAST TECHNIQUE: Multidetector CT imaging of the abdomen and pelvis was performed following the standard protocol without IV contrast. COMPARISON:  Pelvic ultrasound performed 01/21/2015 FINDINGS: The visualized lung bases are clear. The liver is unremarkable in appearance. The spleen is mildly enlarged, measuring 13.7 cm in length. The gallbladder is within normal limits. The pancreas and adrenal glands are unremarkable. There is relatively severe chronic right-sided hydronephrosis, with diffuse dilatation of the right ureter. No distal obstructing stone is seen. This may reflect mass effect from the patient's hydrosalpinges. The left kidney is grossly unremarkable in appearance. Nonspecific perinephric  stranding is noted bilaterally. No nonobstructing renal stones are identified. Prominent retroperitoneal nodes measure up to 1.2 cm in short axis. These are difficult to fully assess without contrast. No free fluid is identified. The small bowel is unremarkable in appearance. The stomach is within normal limits. No acute vascular abnormalities are seen. Minimal vascular calcifications are seen. The appendix is normal in caliber, without evidence of appendicitis. The colon is grossly unremarkable in appearance. The bladder is mildly distended and grossly unremarkable. An apparent 5 cm fibroid is noted within the uterus. A likely exophytic fibroid is seen on the right. Prominent bilateral tubular cystic structures are noted at the pelvis, which may reflect recurrent bilateral hydrosalpinges, left larger than right. The ovaries are not well assessed. No inguinal lymphadenopathy is seen. No acute osseous abnormalities are identified. IMPRESSION: 1. Prominent retroperitoneal nodes measure up to 1.2 cm in short axis. These are difficult to fully assess without contrast. Malignancy cannot be entirely excluded. Would correlate with the patient's history, and consider contrast-enhanced CT or PET/CT when and as deemed clinically appropriate. 2. Relatively severe chronic right-sided hydronephrosis, with diffuse dilatation of the right ureter. This may reflect mild mass-effect from the patient's apparent hydrosalpinges. No obstructing stone seen. 3. Prominent bilateral tubular cystic structures at the pelvis, which may reflect recurrent bilateral hydrosalpinges, left larger than right. This could be further assessed on pelvic ultrasound, or on the follow-up study mentioned above. 4. Uterine fibroids again noted. 5. Mild splenomegaly. Electronically Signed   By: Garald Balding M.D.   On: 09/02/2015 00:48    Time Spent in minutes  30   Lataya Varnell K M.D on 09/02/2015 at 10:17 AM  Between 7am to 7pm - Pager -  [phone]  After 7pm go to [email or web address] - password [facility]  [facility] -  Office  [phone]

## 2015-09-02 NOTE — Progress Notes (Addendum)
Pt. CBG at 2118 46. Hypoglycemic protocol followed. CBG 53 at 2143. And rechecked at 2216 and CBG was 107. NP made aware. NP changed levemir 18 units to 10 units tonight. Pt. Is more alert and eating. Will continue to monitor.

## 2015-09-03 ENCOUNTER — Inpatient Hospital Stay (HOSPITAL_COMMUNITY): Payer: BLUE CROSS/BLUE SHIELD

## 2015-09-03 DIAGNOSIS — I10 Essential (primary) hypertension: Secondary | ICD-10-CM

## 2015-09-03 DIAGNOSIS — D259 Leiomyoma of uterus, unspecified: Secondary | ICD-10-CM

## 2015-09-03 DIAGNOSIS — N731 Chronic parametritis and pelvic cellulitis: Secondary | ICD-10-CM

## 2015-09-03 DIAGNOSIS — N7011 Chronic salpingitis: Principal | ICD-10-CM

## 2015-09-03 DIAGNOSIS — E119 Type 2 diabetes mellitus without complications: Secondary | ICD-10-CM

## 2015-09-03 LAB — CBC
HCT: 21.8 % — ABNORMAL LOW (ref 36.0–46.0)
HEMATOCRIT: 21.2 % — AB (ref 36.0–46.0)
Hemoglobin: 6 g/dL — CL (ref 12.0–15.0)
Hemoglobin: 6.2 g/dL — CL (ref 12.0–15.0)
MCH: 16.9 pg — ABNORMAL LOW (ref 26.0–34.0)
MCH: 17 pg — AB (ref 26.0–34.0)
MCHC: 28.3 g/dL — ABNORMAL LOW (ref 30.0–36.0)
MCHC: 28.4 g/dL — AB (ref 30.0–36.0)
MCV: 59.7 fL — ABNORMAL LOW (ref 78.0–100.0)
MCV: 59.9 fL — AB (ref 78.0–100.0)
PLATELETS: 280 10*3/uL (ref 150–400)
PLATELETS: 292 10*3/uL (ref 150–400)
RBC: 3.55 MIL/uL — ABNORMAL LOW (ref 3.87–5.11)
RBC: 3.64 MIL/uL — ABNORMAL LOW (ref 3.87–5.11)
RDW: 28.7 % — AB (ref 11.5–15.5)
RDW: 29.6 % — AB (ref 11.5–15.5)
WBC: 13.5 10*3/uL — AB (ref 4.0–10.5)
WBC: 17.7 10*3/uL — ABNORMAL HIGH (ref 4.0–10.5)

## 2015-09-03 LAB — URINE CULTURE

## 2015-09-03 LAB — BASIC METABOLIC PANEL
Anion gap: 8 (ref 5–15)
BUN: 7 mg/dL (ref 6–20)
CALCIUM: 8.8 mg/dL — AB (ref 8.9–10.3)
CO2: 26 mmol/L (ref 22–32)
CREATININE: 1.07 mg/dL — AB (ref 0.44–1.00)
Chloride: 100 mmol/L — ABNORMAL LOW (ref 101–111)
GFR calc Af Amer: >60 mL/min (ref >60)
GLUCOSE: 89 mg/dL (ref 65–99)
POTASSIUM: 4.3 mmol/L (ref 3.5–5.1)
Sodium: 134 mmol/L — ABNORMAL LOW (ref 135–145)

## 2015-09-03 LAB — GLUCOSE, CAPILLARY
GLUCOSE-CAPILLARY: 106 mg/dL — AB (ref 65–99)
GLUCOSE-CAPILLARY: 101 mg/dL — AB (ref 65–99)
GLUCOSE-CAPILLARY: 111 mg/dL — AB (ref 65–99)
GLUCOSE-CAPILLARY: 176 mg/dL — AB (ref 65–99)
Glucose-Capillary: 119 mg/dL — ABNORMAL HIGH (ref 65–99)

## 2015-09-03 LAB — MAGNESIUM: Magnesium: 1.8 mg/dL (ref 1.7–2.4)

## 2015-09-03 LAB — HEMOGLOBIN AND HEMATOCRIT, BLOOD
HCT: 24.5 % — ABNORMAL LOW (ref 36.0–46.0)
Hemoglobin: 7.2 g/dL — ABNORMAL LOW (ref 12.0–15.0)

## 2015-09-03 LAB — PREPARE RBC (CROSSMATCH)

## 2015-09-03 MED ORDER — FUROSEMIDE 10 MG/ML IJ SOLN
INTRAMUSCULAR | Status: AC
  Filled 2015-09-03: qty 8

## 2015-09-03 MED ORDER — DICYCLOMINE HCL 10 MG PO CAPS
10.0000 mg | ORAL_CAPSULE | Freq: Three times a day (TID) | ORAL | Status: DC
  Administered 2015-09-03 – 2015-09-07 (×11): 10 mg via ORAL
  Filled 2015-09-03 (×11): qty 1

## 2015-09-03 MED ORDER — FLUCONAZOLE 150 MG PO TABS
150.0000 mg | ORAL_TABLET | Freq: Once | ORAL | Status: AC
  Administered 2015-09-03: 150 mg via ORAL
  Filled 2015-09-03: qty 1

## 2015-09-03 MED ORDER — SODIUM CHLORIDE 0.9 % IV SOLN
INTRAVENOUS | Status: DC
  Administered 2015-09-03: 17:00:00 via INTRAVENOUS

## 2015-09-03 MED ORDER — SODIUM CHLORIDE 0.9 % IV SOLN
Freq: Once | INTRAVENOUS | Status: AC
  Administered 2015-09-03: 14:00:00 via INTRAVENOUS

## 2015-09-03 MED ORDER — POLYETHYLENE GLYCOL 3350 17 G PO PACK
17.0000 g | PACK | Freq: Two times a day (BID) | ORAL | Status: DC
  Administered 2015-09-03 – 2015-09-06 (×7): 17 g via ORAL
  Filled 2015-09-03 (×6): qty 1

## 2015-09-03 MED ORDER — FUROSEMIDE 10 MG/ML IJ SOLN
45.0000 mg | Freq: Once | INTRAMUSCULAR | Status: AC
  Administered 2015-09-03: 45 mg via INTRAVENOUS

## 2015-09-03 MED ORDER — TECHNETIUM TC 99M MERTIATIDE
15.6000 | Freq: Once | INTRAVENOUS | Status: AC | PRN
  Administered 2015-09-03: 16 via INTRAVENOUS

## 2015-09-03 MED ORDER — CALCIUM CARBONATE ANTACID 500 MG PO CHEW
400.0000 mg | CHEWABLE_TABLET | Freq: Three times a day (TID) | ORAL | Status: DC | PRN
  Administered 2015-09-03 – 2015-09-04 (×2): 400 mg via ORAL
  Filled 2015-09-03 (×3): qty 2

## 2015-09-03 MED ORDER — BISACODYL 10 MG RE SUPP
10.0000 mg | Freq: Every day | RECTAL | Status: DC
  Administered 2015-09-03 – 2015-09-06 (×2): 10 mg via RECTAL
  Filled 2015-09-03 (×2): qty 1

## 2015-09-03 MED ORDER — IBUPROFEN 200 MG PO TABS
600.0000 mg | ORAL_TABLET | Freq: Once | ORAL | Status: AC
  Administered 2015-09-03: 600 mg via ORAL
  Filled 2015-09-03: qty 3

## 2015-09-03 NOTE — Progress Notes (Signed)
Subjective: Patient s/p IR drainage of Left hydrosalpinx with 218mL of purulent drainage.  She reports nausea, gas pains, pain at source of drain.  Pelvic pain slightly improved.    Starting to have vaginal itching.  Usually has yeast infections with antibiotics.  Objective: I have reviewed patient's vital signs, intake and output, medications, labs and microbiology.  BP 123/65 mmHg  Pulse 103  Temp(Src) 99.5 F (37.5 C) (Oral)  Resp 22  Ht 5\' 4"  (1.626 m)  Wt 196 lb 12.2 oz (89.25 kg)  BMI 33.76 kg/m2  SpO2 97%  LMP 08/23/2015   Intake/Output Summary (Last 24 hours) at 09/03/15 0933 Last data filed at 09/03/15 0800  Gross per 24 hour  Intake 1366.5 ml  Output   2665 ml  Net -1298.5 ml  Drain at 66mL, mostly serosanguinous, but some purulent material in tube.  General: alert, cooperative and no distress Resp: clear to auscultation bilaterally Cardio: regular rate and rhythm, S1, S2 normal, no murmur, click, rub or gallop GI: soft, tender in the pelvic.  No rebound tenderness or guarding.  Bowel sounds active. Extremities: extremities normal, atraumatic, no cyanosis or edema and Homans sign is negative, no sign of DVT Patient has drain in left back.  No significant bleeding from drain site.  Assessment/Plan: 1.   Acute Blood loss anemia 2.  UTI 3.  Hydro vs pyosalpinx 4.  DM2 5.  HTN 6.  Chronic PID  Recommendations:  Added Diflucan for yeast vaginitis.  Added bentyl for gas pains/cramping  Continue megace for bleeding control.  Continue iron replacement  Continue Pain control - pain should start to improve over next couple of days.  Continue antibiotics: Gent, clindamycin  Will follow cultures: both urine and pelvic pyosalpinx.   LOS: 1 day    Shyheim Tanney JEHIEL 09/03/2015, 9:31 AM

## 2015-09-03 NOTE — Progress Notes (Signed)
Patient ID: Amy Lowe, female   DOB: 1975/07/13, 40 y.o.   MRN: [id number]    Referring Physician(s): Loma Boston  Supervising Physician: Marybelle Killings  Patient Status: inpt  Chief Complaint: hydrosalpinx  Subjective: Patient doesn't feel great today.  Ran fever of 102.9 today.  Currently receiving blood.  Allergies: Penicillins and Iodinated diagnostic agents  Medications: Prior to Admission medications   Medication Sig Start Date End Date Taking? Authorizing Provider  diphenhydrAMINE (BENADRYL) 25 MG tablet Take 25 mg by mouth every 6 (six) hours as needed. For allergy symptom relief   Yes Historical Provider, MD  HYDROcodone-acetaminophen (NORCO/VICODIN) 5-325 MG tablet Take 1 tablet by mouth every 6 (six) hours as needed for moderate pain.   Yes Historical Provider, MD  ibuprofen (ADVIL,MOTRIN) 200 MG tablet Take 600 mg by mouth every 6 (six) hours as needed. For pain relief   Yes Historical Provider, MD  insulin detemir (LEVEMIR) 100 UNIT/ML injection Inject 18 Units into the skin 2 (two) times daily.   Yes Historical Provider, MD  insulin lispro (HUMALOG) 100 UNIT/ML injection Inject 8 Units into the skin 3 (three) times daily before meals.   Yes Historical Provider, MD  lisinopril (PRINIVIL,ZESTRIL) 10 MG tablet Take 10 mg by mouth daily.   Yes Historical Provider, MD  tranexamic acid (LYSTEDA) 650 MG TABS tablet Take 1 tablet by mouth 3 (three) times daily. Take two tablets three times daily during the first five days of menses. 08/22/15  Yes Historical Provider, MD  clindamycin (CLEOCIN) 150 MG capsule 2 PO TID Patient not taking: Reported on 01/20/2015 10/15/11   Dalia Heading, PA-C  oxyCODONE-acetaminophen (PERCOCET/ROXICET) 5-325 MG per tablet Take 1 tablet by mouth every 4 (four) hours as needed for pain. Patient not taking: Reported on 01/20/2015 08/02/12   Pattricia Boss, MD    Vital Signs: BP 128/53 mmHg  Pulse 92  Temp(Src) 99.7 F (37.6 C) (Oral)  Resp 18   Ht 5\' 4"  (1.626 m)  Wt 196 lb 12.2 oz (89.25 kg)  BMI 33.76 kg/m2  SpO2 100%  LMP 08/23/2015  Physical Exam: Abd: soft, some abdominal tenderness, left transgluteal drain in place with serous drainage currently.  Only put out 15cc today so far.  Drain site is c/d/i  Imaging: Nm Renal Imaging Flow W/pharm  09/03/2015  CLINICAL DATA:  RIGHT hydronephrosis EXAM: NUCLEAR MEDICINE RENAL SCAN WITH DIURETIC ADMINISTRATION TECHNIQUE: Radionuclide angiographic and sequential renal images were obtained after intravenous injection of radiopharmaceutical. Imaging was continued during slow intravenous injection of Lasix approximately 15 minutes after the start of the examination. RADIOPHARMACEUTICALS:  15.6 mCi Technetium-64m MAG3 IV COMPARISON:  CT 09/02/2015 FINDINGS: Flow: Delayed arterial flow to the RIGHT kidney compared to the LEFT. Left renogram: Uniform uptake of counts in the renal cortex. Counts are promptly excreted into the collecting system and cleared prior to administration of Lasix. Lasix augment clearance. No postvoid residual. Right renogram: Delayed cortical uptake in the RIGHT kidney. RIGHT renal cortex is thinned and hydronephrosis is present. There is minimal excretion into the RIGHT renal collecting system. The RIGHT ureter is not visualized directly. Postvoid renal cortical activity and renal pelvis residual. Differential: Left kidney = 77 % Right kidney = 23 % T1/2 post Lasix : Left kidney = 3.7 minutes (counts clear significantly prior to administration of Lasix) Right kidney = no T 1/2 post lasix reached IMPRESSION: 1. Severe obstructive hydronephrosis of the RIGHT kidney. 2. Asymmetric renal differential as above. Electronically Signed   By: Suzy Bouchard  M.D.   On: 09/03/2015 14:43   Ct Renal Stone Study  09/02/2015  CLINICAL DATA:  Left flank and abdominal pain, acute onset. Right blood cells and Bebo blood cells in the urine. Initial encounter. EXAM: CT ABDOMEN AND PELVIS  WITHOUT CONTRAST TECHNIQUE: Multidetector CT imaging of the abdomen and pelvis was performed following the standard protocol without IV contrast. COMPARISON:  Pelvic ultrasound performed 01/21/2015 FINDINGS: The visualized lung bases are clear. The liver is unremarkable in appearance. The spleen is mildly enlarged, measuring 13.7 cm in length. The gallbladder is within normal limits. The pancreas and adrenal glands are unremarkable. There is relatively severe chronic right-sided hydronephrosis, with diffuse dilatation of the right ureter. No distal obstructing stone is seen. This may reflect mass effect from the patient's hydrosalpinges. The left kidney is grossly unremarkable in appearance. Nonspecific perinephric stranding is noted bilaterally. No nonobstructing renal stones are identified. Prominent retroperitoneal nodes measure up to 1.2 cm in short axis. These are difficult to fully assess without contrast. No free fluid is identified. The small bowel is unremarkable in appearance. The stomach is within normal limits. No acute vascular abnormalities are seen. Minimal vascular calcifications are seen. The appendix is normal in caliber, without evidence of appendicitis. The colon is grossly unremarkable in appearance. The bladder is mildly distended and grossly unremarkable. An apparent 5 cm fibroid is noted within the uterus. A likely exophytic fibroid is seen on the right. Prominent bilateral tubular cystic structures are noted at the pelvis, which may reflect recurrent bilateral hydrosalpinges, left larger than right. The ovaries are not well assessed. No inguinal lymphadenopathy is seen. No acute osseous abnormalities are identified. IMPRESSION: 1. Prominent retroperitoneal nodes measure up to 1.2 cm in short axis. These are difficult to fully assess without contrast. Malignancy cannot be entirely excluded. Would correlate with the patient's history, and consider contrast-enhanced CT or PET/CT when and as  deemed clinically appropriate. 2. Relatively severe chronic right-sided hydronephrosis, with diffuse dilatation of the right ureter. This may reflect mild mass-effect from the patient's apparent hydrosalpinges. No obstructing stone seen. 3. Prominent bilateral tubular cystic structures at the pelvis, which may reflect recurrent bilateral hydrosalpinges, left larger than right. This could be further assessed on pelvic ultrasound, or on the follow-up study mentioned above. 4. Uterine fibroids again noted. 5. Mild splenomegaly. Electronically Signed   By: Garald Balding M.D.   On: 09/02/2015 00:48    Labs:  CBC:  Recent Labs  01/20/15 2249 09/01/15 2321 09/02/15 0933 09/03/15 0443  WBC 5.9 11.9* 13.5* 17.7*  HGB 11.4* 3.8* 6.0* 6.2*  HCT 35.8* 14.6* 21.2* 21.8*  PLT 255 272 280 292    COAGS:  Recent Labs  09/02/15 1050  INR 1.38    BMP:  Recent Labs  01/20/15 2249 09/01/15 2321 09/02/15 0933 09/03/15 0443  NA 137 132* 133* 134*  K 4.1 3.3* 3.7 4.3  CL 108 98* 97* 100*  CO2 23 25 25 26   GLUCOSE 124* 184* 171* 89  BUN 13 11 10 7   CALCIUM 8.9 8.4* 8.7* 8.8*  CREATININE 1.30* 1.25* 1.11* 1.07*  GFRNONAA 51* 53* >60 >60  GFRAA 59* >60 >60 >60    LIVER FUNCTION TESTS: No results for input(s): BILITOT, AST, ALT, ALKPHOS, PROT, ALBUMIN in the last 8760 hours.  Assessment and Plan: 1. Pyosalpinx, s/p drain placement 5/29 Laurence Ferrari -no further purulent drainage so far today.  Cont drain and irrigations for now. -anticipate some of her fevers and elevated WBC are reactive to  our drain placement.  Will continue to follow these as they should start trending down soon, if they are related to drain insertion.   Electronically Signed: Henreitta Cea 09/03/2015, 3:25 PM   I spent a total of 15 Minutes at the the patient's bedside AND on the patient's hospital floor or unit, greater than 50% of which was counseling/coordinating care for pyosalpinx

## 2015-09-03 NOTE — Progress Notes (Signed)
PROGRESS NOTE                                                                                                                                                                                                             Patient Demographics:    Amy Lowe, is a 40 y.o. female, DOB - January 14, 1976, [id number]  Admit date - 09/01/2015   Admitting Physician [name], MD  Outpatient Primary MD for the patient is PROVIDER NOT [place]  LOS - 1  Chief Complaint  Patient presents with  . Flank Pain  . Abdominal Pain       Brief Narrative     Amy Lowe is a 40 y.o. female with medical history significant of with chronic and deficiency anemia previously received IV iron therapy, fibroid uterus, on diabetes mellitus, hypertension presents to the ER because of increasing pain in the abdomen on the left side over the last 2-3 days. The pain was radiating to her left groin. Had some nausea denies any vomiting or diarrhea. Denies any fever chills chest pain or shortness of breath. In the ER patient's hemoglobin was found to be around 3.8 and UA shows patient is consistent with UTI. CT abdomen and pelvis done shows bilateral hydrosalpinx with right-sided hydronephrosis and hydroureter.    Subjective:    Amy Lowe today has, No headache, No chest pain, Positive diffuse to right-sided abdominal pain - No Nausea, No new weakness tingling or numbness, No Cough - SOB    Assessment  & Plan :     1.Abdominal pain due to bilateral hydrosalpinx and chronic right-sided hydronephrosis. Long-standing history of fibroid uterus with menorrhagia. Patient follows with GYN physician Dr. Katherine Mantle, she has been advised to undergo hysterectomy multiple times in the past but she has refused and is currently refusing surgery as well. She Understands the risks and benefits of possible death and disability or stroke.  She has  been seen by both GYN and urology here, no urological procedures, she refuses hysterectomy again, OB consulted IR for left-sided hydrosalpinx and drainage which was done on 09/02/2015 with 200 mL of pus drained, cultures pending, she has been placed by GYN on clindamycin and gentamicin for possible PID. Also Diflucan added for vagina candidiasis, Will monitor cultures and clinical response. Continue supportive care with IV fluids and  pain control.   2. Severe microcytic iron deficiency anemia. Due to menorrhagia, anemia panel pending, she is post 2 units of packed RBC transfusion in the ER, initially refused further transfusions but has agreed to take 1 unit of packed RBC transfusion on 09/03/2015. We'll place her on once infections aren't better controlled.  3. Severe menorrhagia. She is currently on Megace to Tranexamic acid per GYN, GYN on case.  4. UTI. For now clindamycin and gentamicin. Follow cultures. No signs and symptoms of pyelonephritis at this time. Urology following.  5. DM type II. Currently on Lantus and sliding scale will monitor.  No results found for: HGBA1C CBG (last 3)   Recent Labs  09/02/15 2216 09/03/15 0624 09/03/15 0749  GLUCAP 107* 106* 101*     Code Status :  Full  Family Communication  : None present  Disposition Plan  :  Stay in the hospital likely discharge in the morning  Consults  : OB, urology, IR  Procedures  :    CT abdomen and pelvis showing R.hydronephrosis and hydrosalpinx  CT-guided left hydrosalpinx drainage by IR with 200 mL of pus removed  DVT Prophylaxis  :    SCDs   Lab Results  Component Value Date   PLT 292 09/03/2015    Inpatient Medications  Scheduled Meds: . sodium chloride   Intravenous Once  . bisacodyl  10 mg Rectal Daily  . calcium carbonate  1 tablet Oral BID WC  . clindamycin (CLEOCIN) IV  900 mg Intravenous Q8H  . dicyclomine  10 mg Oral TID AC  . [START ON 09/04/2015] gentamicin  7 mg/kg (Adjusted)  Intravenous Q36H  . insulin aspart  0-9 Units Subcutaneous TID WC  . insulin aspart  8 Units Subcutaneous TID WC  . insulin detemir  18 Units Subcutaneous BID  . lisinopril  10 mg Oral Daily  . polyethylene glycol  17 g Oral BID  . tranexamic acid  650 mg Oral TID   Continuous Infusions: . sodium chloride     PRN Meds:.acetaminophen **OR** [DISCONTINUED] acetaminophen, calcium carbonate, HYDROcodone-acetaminophen, HYDROmorphone (DILAUDID) injection, ondansetron **OR** ondansetron (ZOFRAN) IV  Antibiotics  :    Anti-infectives    Start     Dose/Rate Route Frequency Ordered Stop   09/04/15 0000  gentamicin (GARAMYCIN) 480 mg in dextrose 5 % 100 mL IVPB     7 mg/kg  68.9 kg (Adjusted) 112 mL/hr over 60 Minutes Intravenous Every 36 hours 09/02/15 2212     09/03/15 1000  fluconazole (DIFLUCAN) tablet 150 mg     150 mg Oral  Once 09/03/15 0930 09/03/15 1020   09/02/15 1200  gentamicin (GARAMYCIN) 480 mg in dextrose 5 % 100 mL IVPB  Status:  Discontinued     7 mg/kg  68.9 kg (Adjusted) 112 mL/hr over 60 Minutes Intravenous Every 24 hours 09/02/15 1024 09/02/15 2211   09/02/15 1130  clindamycin (CLEOCIN) IVPB 900 mg     900 mg 100 mL/hr over 30 Minutes Intravenous Every 8 hours 09/02/15 1001     09/02/15 0045  levofloxacin (LEVAQUIN) IVPB 500 mg     500 mg 100 mL/hr over 60 Minutes Intravenous  Once 09/02/15 0039 09/02/15 0336         Objective:   Filed Vitals:   09/03/15 0026 09/03/15 0455 09/03/15 0500 09/03/15 0606  BP:  123/65    Pulse:  103    Temp: 100.5 F (38.1 C) 100.2 F (37.9 C)  99.5 F (37.5 C)  TempSrc:  Oral Oral  Oral  Resp:  22    Height:      Weight:   89.25 kg (196 lb 12.2 oz)   SpO2:  97%      Wt Readings from Last 3 Encounters:  09/03/15 89.25 kg (196 lb 12.2 oz)     Intake/Output Summary (Last 24 hours) at 09/03/15 1135 Last data filed at 09/03/15 0800  Gross per 24 hour  Intake 1366.5 ml  Output   2165 ml  Net -798.5 ml     Physical  Exam  Awake Alert, Oriented X 3, No new F.N deficits, Normal affect Amy Lowe,PERRAL Supple Neck,No JVD, No cervical lymphadenopathy appriciated.  Symmetrical Chest wall movement, Good air movement bilaterally, CTAB RRR,No Gallops,Rubs or new Murmurs, No Parasternal Heave +ve B.Sounds, Abd Soft, mild lower abd tenderness, No organomegaly appriciated, No rebound - guarding or rigidity. No Cyanosis, Clubbing or edema, No new Rash or bruise      Data Review:    CBC  Recent Labs Lab 09/01/15 2321 09/02/15 0933 09/03/15 0443  WBC 11.9* 13.5* 17.7*  HGB 3.8* 6.0* 6.2*  HCT 14.6* 21.2* 21.8*  PLT 272 280 292  MCV 53.5* 59.7* 59.9*  MCH 13.9* 16.9* 17.0*  MCHC 26.0* 28.3* 28.4*  RDW 21.5* 28.7* 29.6*  LYMPHSABS 1.6  --   --   MONOABS 1.6*  --   --   EOSABS 0.0  --   --   BASOSABS 0.0  --   --     Chemistries   Recent Labs Lab 09/01/15 2321 09/02/15 0933 09/03/15 0443  NA 132* 133* 134*  K 3.3* 3.7 4.3  CL 98* 97* 100*  CO2 25 25 26   GLUCOSE 184* 171* 89  BUN 11 10 7   CREATININE 1.25* 1.11* 1.07*  CALCIUM 8.4* 8.7* 8.8*  MG  --   --  1.8   ------------------------------------------------------------------------------------------------------------------ No results for input(s): CHOL, HDL, LDLCALC, TRIG, CHOLHDL, LDLDIRECT in the last 72 hours.  No results found for: HGBA1C ------------------------------------------------------------------------------------------------------------------ No results for input(s): TSH, T4TOTAL, T3FREE, THYROIDAB in the last 72 hours.  Invalid input(s): FREET3 ------------------------------------------------------------------------------------------------------------------  Recent Labs  09/02/15 0933  VITAMINB12 668  FOLATE 13.2  FERRITIN 22  TIBC 319  IRON 9*  RETICCTPCT <0.4*    Coagulation profile  Recent Labs Lab 09/02/15 1050  INR 1.38    No results for input(s): DDIMER in the last 72 hours.  Cardiac Enzymes No  results for input(s): CKMB, TROPONINI, MYOGLOBIN in the last 168 hours.  Invalid input(s): CK ------------------------------------------------------------------------------------------------------------------ No results found for: BNP  Micro Results Recent Results (from the past 240 hour(s))  Urine C&S     Status: Abnormal   Collection Time: 09/01/15 10:32 PM  Result Value Ref Range Status   Specimen Description URINE, CLEAN CATCH  Final   Special Requests NONE  Final   Culture MULTIPLE SPECIES PRESENT, SUGGEST RECOLLECTION (A)  Final   Report Status 09/03/2015 FINAL  Final  Aerobic / Anaerobic Culture     Status: None (Preliminary result)   Collection Time: 09/02/15  6:25 PM  Result Value Ref Range Status   Specimen Description ABSCESS LEFT FALLOPIAN TUBE  Final   Special Requests Normal  Final   Gram Stain   Final    ABUNDANT WBC PRESENT,BOTH PMN AND MONONUCLEAR ABUNDANT GRAM POSITIVE COCCOBACILLUS Performed at [facility]    Culture PENDING  Incomplete   Report Status PENDING  Incomplete    Radiology Reports Ct Renal Stone Study  09/02/2015  CLINICAL DATA:  Left flank and abdominal pain, acute onset. Right blood cells and Orozco blood cells in the urine. Initial encounter. EXAM: CT ABDOMEN AND PELVIS WITHOUT CONTRAST TECHNIQUE: Multidetector CT imaging of the abdomen and pelvis was performed following the standard protocol without IV contrast. COMPARISON:  Pelvic ultrasound performed 01/21/2015 FINDINGS: The visualized lung bases are clear. The liver is unremarkable in appearance. The spleen is mildly enlarged, measuring 13.7 cm in length. The gallbladder is within normal limits. The pancreas and adrenal glands are unremarkable. There is relatively severe chronic right-sided hydronephrosis, with diffuse dilatation of the right ureter. No distal obstructing stone is seen. This may reflect mass effect from the patient's hydrosalpinges. The left kidney is grossly unremarkable in  appearance. Nonspecific perinephric stranding is noted bilaterally. No nonobstructing renal stones are identified. Prominent retroperitoneal nodes measure up to 1.2 cm in short axis. These are difficult to fully assess without contrast. No free fluid is identified. The small bowel is unremarkable in appearance. The stomach is within normal limits. No acute vascular abnormalities are seen. Minimal vascular calcifications are seen. The appendix is normal in caliber, without evidence of appendicitis. The colon is grossly unremarkable in appearance. The bladder is mildly distended and grossly unremarkable. An apparent 5 cm fibroid is noted within the uterus. A likely exophytic fibroid is seen on the right. Prominent bilateral tubular cystic structures are noted at the pelvis, which may reflect recurrent bilateral hydrosalpinges, left larger than right. The ovaries are not well assessed. No inguinal lymphadenopathy is seen. No acute osseous abnormalities are identified. IMPRESSION: 1. Prominent retroperitoneal nodes measure up to 1.2 cm in short axis. These are difficult to fully assess without contrast. Malignancy cannot be entirely excluded. Would correlate with the patient's history, and consider contrast-enhanced CT or PET/CT when and as deemed clinically appropriate. 2. Relatively severe chronic right-sided hydronephrosis, with diffuse dilatation of the right ureter. This may reflect mild mass-effect from the patient's apparent hydrosalpinges. No obstructing stone seen. 3. Prominent bilateral tubular cystic structures at the pelvis, which may reflect recurrent bilateral hydrosalpinges, left larger than right. This could be further assessed on pelvic ultrasound, or on the follow-up study mentioned above. 4. Uterine fibroids again noted. 5. Mild splenomegaly. Electronically Signed   By: Garald Balding M.D.   On: 09/02/2015 00:48    Time Spent in minutes  30   Garey Alleva K M.D on 09/03/2015 at 11:35  AM  Between 7am to 7pm - Pager - [phone]  After 7pm go to [email or web address] - password [facility]  [facility] -  Office  [phone]

## 2015-09-03 NOTE — Progress Notes (Signed)
Inpatient Diabetes Program Recommendations  AACE/ADA: New Consensus Statement on Inpatient Glycemic Control (2015)  Target Ranges:  Prepandial:   less than 140 mg/dL      Peak postprandial:   less than 180 mg/dL (1-2 hours)      Critically ill patients:  140 - 180 mg/dL   Review of Glycemic Control  Results for AMANDA-JANE, CORNELLIER (MRN [id number]) as of 09/03/2015 09:42  Ref. Range 09/02/2015 12:39 09/02/2015 15:08 09/02/2015 16:51 09/02/2015 21:18 09/02/2015 21:43 09/02/2015 22:16 09/03/2015 06:24 09/03/2015 07:49  Glucose-Capillary Latest Ref Range: 65-99 mg/dL 109 (H) 69 83 46 (L) 53 (L) 107 (H) 106 (H) 101 (H)   Hypoglycemia. Needs insulin adjustment.  Inpatient Diabetes Program Recommendations:    Decrease Levemir to 15 units bid. Decrease Novolog to 4 units tidwc.  Will continue to follow.  Thank you. Lorenda Peck, RD, LDN, CDE Inpatient Diabetes Coordinator [phone]

## 2015-09-04 ENCOUNTER — Other Ambulatory Visit: Payer: Self-pay | Admitting: Urology

## 2015-09-04 DIAGNOSIS — N1330 Unspecified hydronephrosis: Secondary | ICD-10-CM | POA: Insufficient documentation

## 2015-09-04 DIAGNOSIS — N730 Acute parametritis and pelvic cellulitis: Secondary | ICD-10-CM | POA: Insufficient documentation

## 2015-09-04 LAB — CBC
HEMATOCRIT: 22.9 % — AB (ref 36.0–46.0)
Hemoglobin: 6.8 g/dL — CL (ref 12.0–15.0)
MCH: 18.7 pg — ABNORMAL LOW (ref 26.0–34.0)
MCHC: 29.7 g/dL — AB (ref 30.0–36.0)
MCV: 63.1 fL — AB (ref 78.0–100.0)
PLATELETS: 326 10*3/uL (ref 150–400)
RBC: 3.63 MIL/uL — ABNORMAL LOW (ref 3.87–5.11)
RDW: 32.6 % — AB (ref 11.5–15.5)
WBC: 19.1 10*3/uL — AB (ref 4.0–10.5)

## 2015-09-04 LAB — GLUCOSE, CAPILLARY
GLUCOSE-CAPILLARY: 169 mg/dL — AB (ref 65–99)
GLUCOSE-CAPILLARY: 147 mg/dL — AB (ref 65–99)
GLUCOSE-CAPILLARY: 72 mg/dL (ref 65–99)
Glucose-Capillary: 128 mg/dL — ABNORMAL HIGH (ref 65–99)

## 2015-09-04 LAB — PREPARE RBC (CROSSMATCH)

## 2015-09-04 MED ORDER — MEGESTROL ACETATE 20 MG PO TABS
20.0000 mg | ORAL_TABLET | Freq: Two times a day (BID) | ORAL | Status: DC
  Administered 2015-09-04 – 2015-09-06 (×5): 20 mg via ORAL
  Filled 2015-09-04 (×8): qty 1

## 2015-09-04 MED ORDER — NYSTATIN-TRIAMCINOLONE 100000-0.1 UNIT/GM-% EX CREA
TOPICAL_CREAM | Freq: Two times a day (BID) | CUTANEOUS | Status: DC
Start: 2015-09-04 — End: 2015-09-07
  Administered 2015-09-04 – 2015-09-06 (×5): via TOPICAL
  Filled 2015-09-04 (×2): qty 15

## 2015-09-04 MED ORDER — SODIUM CHLORIDE 0.9 % IV SOLN
Freq: Once | INTRAVENOUS | Status: AC
  Administered 2015-09-04: 12:00:00 via INTRAVENOUS

## 2015-09-04 MED ORDER — FUROSEMIDE 10 MG/ML IJ SOLN
10.0000 mg | INTRAMUSCULAR | Status: AC
  Administered 2015-09-04: 10 mg via INTRAVENOUS
  Filled 2015-09-04: qty 2

## 2015-09-04 MED ORDER — DIPHENHYDRAMINE HCL 50 MG/ML IJ SOLN: 25.0000 mg | Freq: Four times a day (QID) | INTRAMUSCULAR | Status: DC | PRN

## 2015-09-04 MED ORDER — SODIUM CHLORIDE 0.9 % IV SOLN
INTRAVENOUS | Status: DC
  Administered 2015-09-04: 12:00:00 via INTRAVENOUS

## 2015-09-04 MED ORDER — ALUM & MAG HYDROXIDE-SIMETH 200-200-20 MG/5ML PO SUSP
30.0000 mL | Freq: Four times a day (QID) | ORAL | Status: DC | PRN
  Administered 2015-09-04 – 2015-09-05 (×2): 30 mL via ORAL
  Filled 2015-09-04 (×2): qty 30

## 2015-09-04 NOTE — Progress Notes (Signed)
CRITICAL VALUE ALERT  Critical value received:  Hgb 6.8  Date of notification:  09/04/2015  Time of notification:  [id number]  Critical value read back:Yes.    Nurse who received alert:  Carnella Gu[place] I  Pt's Hgb has been low. Her rounding team is following it. Will continue to monitor.

## 2015-09-04 NOTE — Progress Notes (Signed)
Patient ID: Amy Lowe, female   DOB: [birth year], 40 y.o.   MRN: [id number]    Referring Physician(s): [place] Physician: Daryll Brod  Patient Status: In-pt  Chief Complaint:  hydrosalpinx  Subjective:  Pt feeling a little better this am; less sore at left TG drain site; has some vag itching/spotting  Allergies: Penicillins and Iodinated diagnostic agents  Medications: Prior to Admission medications   Medication Sig Start Date End Date Taking? Authorizing Provider  diphenhydrAMINE (BENADRYL) 25 MG tablet Take 25 mg by mouth every 6 (six) hours as needed. For allergy symptom relief   Yes Historical Provider, MD  HYDROcodone-acetaminophen (NORCO/VICODIN) 5-325 MG tablet Take 1 tablet by mouth every 6 (six) hours as needed for moderate pain.   Yes Historical Provider, MD  ibuprofen (ADVIL,MOTRIN) 200 MG tablet Take 600 mg by mouth every 6 (six) hours as needed. For pain relief   Yes Historical Provider, MD  insulin detemir (LEVEMIR) 100 UNIT/ML injection Inject 18 Units into the skin 2 (two) times daily.   Yes Historical Provider, MD  insulin lispro (HUMALOG) 100 UNIT/ML injection Inject 8 Units into the skin 3 (three) times daily before meals.   Yes Historical Provider, MD  lisinopril (PRINIVIL,ZESTRIL) 10 MG tablet Take 10 mg by mouth daily.   Yes Historical Provider, MD  tranexamic acid (LYSTEDA) 650 MG TABS tablet Take 1 tablet by mouth 3 (three) times daily. Take two tablets three times daily during the first five days of menses. 08/22/15  Yes Historical Provider, MD  clindamycin (CLEOCIN) 150 MG capsule 2 PO TID Patient not taking: Reported on 01/20/2015 10/15/11   Dalia Heading, PA-C  oxyCODONE-acetaminophen (PERCOCET/ROXICET) 5-325 MG per tablet Take 1 tablet by mouth every 4 (four) hours as needed for pain. Patient not taking: Reported on 01/20/2015 08/02/12   Pattricia Boss, MD     Vital Signs: BP 129/73 mmHg  Pulse 77  Temp(Src) 98.4 F (36.9 C)  (Oral)  Resp 14  Ht 5\' 4"  (1.626 m)  Wt 200 lb 11.2 oz (91.037 kg)  BMI 34.43 kg/m2  SpO2 100%  LMP 08/23/2015  Physical Exam left TG/pelvic drain intact, insertion site clean and dry, mildly tender; output 20 cc serous fluid; cx's neg to date  Imaging: Nm Renal Imaging Flow W/pharm  09/03/2015  CLINICAL DATA:  RIGHT hydronephrosis EXAM: NUCLEAR MEDICINE RENAL SCAN WITH DIURETIC ADMINISTRATION TECHNIQUE: Radionuclide angiographic and sequential renal images were obtained after intravenous injection of radiopharmaceutical. Imaging was continued during slow intravenous injection of Lasix approximately 15 minutes after the start of the examination. RADIOPHARMACEUTICALS:  15.6 mCi Technetium-15m MAG3 IV COMPARISON:  CT 09/02/2015 FINDINGS: Flow: Delayed arterial flow to the RIGHT kidney compared to the LEFT. Left renogram: Uniform uptake of counts in the renal cortex. Counts are promptly excreted into the collecting system and cleared prior to administration of Lasix. Lasix augment clearance. No postvoid residual. Right renogram: Delayed cortical uptake in the RIGHT kidney. RIGHT renal cortex is thinned and hydronephrosis is present. There is minimal excretion into the RIGHT renal collecting system. The RIGHT ureter is not visualized directly. Postvoid renal cortical activity and renal pelvis residual. Differential: Left kidney = 77 % Right kidney = 23 % T1/2 post Lasix : Left kidney = 3.7 minutes (counts clear significantly prior to administration of Lasix) Right kidney = no T 1/2 post lasix reached IMPRESSION: 1. Severe obstructive hydronephrosis of the RIGHT kidney. 2. Asymmetric renal differential as above. Electronically Signed   By: Helane Gunther.D.  On: 09/03/2015 14:43   US Transvaginal Non-ob  09/03/2015  CLINICAL DATA:  Abdominal pain EXAM: TRANSABDOMINAL AND TRANSVAGINAL ULTRASOUND OF PELVIS TECHNIQUE: Study was performed transabdominally to optimize pelvic field of view evaluation and  transvaginally to optimize internal visceral architecture evaluation. COMPARISON:  CT abdomen and pelvis Sep 02, 2015 FINDINGS: Uterus Measurements: 12.3 x 7.7 x 8.3 cm. The echogenicity of the uterus is diffusely inhomogeneous consistent with leiomyomatous change. There is a dominant mass in the midportion of the uterus anteriorly measuring 6.6 x 5.4 x 6.2 cm. Along the superior aspect of the uterus, there is a 3.0 x 3.7 x 3.4 cm mass. These lesions are felt to be consistent with leiomyomas. Endometrium Thickness: 14 mm.  Dominant uterine mass impresses upon endometrium. Right ovary Ovarian appearing tissue is not evident by transabdominal or transvaginal technique. There is a complex tubular appearing lesion in the right adnexal area, concerning for potential hydrosalpinx. Left ovary Ovarian appearing tissue is not evident by transabdominal or transvaginal technique. There is a complex cystic area measuring 13.2 x 6.7 x 5.5 cm 5.5 cm in the left adnexal region with tubular appearing cystic fluid noted in this area as well. Other findings Minimal free fluid noted. IMPRESSION: Complex cystic structure left adnexa. Suspect bilateral hydrosalpinx. Prominent uterus with leiomyomatous change. Dominant leiomyoma measuring 6.6 x 5.4 x 6.2 cm. The current sonographic findings are commensurate with the CT findings of 1 day prior. Given the difficulty in discerning discrete extrauterine structures, correlation with pelvic MR may be reasonable in this circumstance. Electronically Signed   By: Lowella Grip III M.D.   On: 09/03/2015 16:27   US Pelvis Complete  09/03/2015  CLINICAL DATA:  Abdominal pain EXAM: TRANSABDOMINAL AND TRANSVAGINAL ULTRASOUND OF PELVIS TECHNIQUE: Study was performed transabdominally to optimize pelvic field of view evaluation and transvaginally to optimize internal visceral architecture evaluation. COMPARISON:  CT abdomen and pelvis Sep 02, 2015 FINDINGS: Uterus Measurements: 12.3 x 7.7 x 8.3  cm. The echogenicity of the uterus is diffusely inhomogeneous consistent with leiomyomatous change. There is a dominant mass in the midportion of the uterus anteriorly measuring 6.6 x 5.4 x 6.2 cm. Along the superior aspect of the uterus, there is a 3.0 x 3.7 x 3.4 cm mass. These lesions are felt to be consistent with leiomyomas. Endometrium Thickness: 14 mm.  Dominant uterine mass impresses upon endometrium. Right ovary Ovarian appearing tissue is not evident by transabdominal or transvaginal technique. There is a complex tubular appearing lesion in the right adnexal area, concerning for potential hydrosalpinx. Left ovary Ovarian appearing tissue is not evident by transabdominal or transvaginal technique. There is a complex cystic area measuring 13.2 x 6.7 x 5.5 cm 5.5 cm in the left adnexal region with tubular appearing cystic fluid noted in this area as well. Other findings Minimal free fluid noted. IMPRESSION: Complex cystic structure left adnexa. Suspect bilateral hydrosalpinx. Prominent uterus with leiomyomatous change. Dominant leiomyoma measuring 6.6 x 5.4 x 6.2 cm. The current sonographic findings are commensurate with the CT findings of 1 day prior. Given the difficulty in discerning discrete extrauterine structures, correlation with pelvic MR may be reasonable in this circumstance. Electronically Signed   By: Lowella Grip III M.D.   On: 09/03/2015 16:27   Ct Renal Stone Study  09/02/2015  CLINICAL DATA:  Left flank and abdominal pain, acute onset. Right blood cells and Fusselman blood cells in the urine. Initial encounter. EXAM: CT ABDOMEN AND PELVIS WITHOUT CONTRAST TECHNIQUE: Multidetector CT imaging of the abdomen  and pelvis was performed following the standard protocol without IV contrast. COMPARISON:  Pelvic ultrasound performed 01/21/2015 FINDINGS: The visualized lung bases are clear. The liver is unremarkable in appearance. The spleen is mildly enlarged, measuring 13.7 cm in length. The  gallbladder is within normal limits. The pancreas and adrenal glands are unremarkable. There is relatively severe chronic right-sided hydronephrosis, with diffuse dilatation of the right ureter. No distal obstructing stone is seen. This may reflect mass effect from the patient's hydrosalpinges. The left kidney is grossly unremarkable in appearance. Nonspecific perinephric stranding is noted bilaterally. No nonobstructing renal stones are identified. Prominent retroperitoneal nodes measure up to 1.2 cm in short axis. These are difficult to fully assess without contrast. No free fluid is identified. The small bowel is unremarkable in appearance. The stomach is within normal limits. No acute vascular abnormalities are seen. Minimal vascular calcifications are seen. The appendix is normal in caliber, without evidence of appendicitis. The colon is grossly unremarkable in appearance. The bladder is mildly distended and grossly unremarkable. An apparent 5 cm fibroid is noted within the uterus. A likely exophytic fibroid is seen on the right. Prominent bilateral tubular cystic structures are noted at the pelvis, which may reflect recurrent bilateral hydrosalpinges, left larger than right. The ovaries are not well assessed. No inguinal lymphadenopathy is seen. No acute osseous abnormalities are identified. IMPRESSION: 1. Prominent retroperitoneal nodes measure up to 1.2 cm in short axis. These are difficult to fully assess without contrast. Malignancy cannot be entirely excluded. Would correlate with the patient's history, and consider contrast-enhanced CT or PET/CT when and as deemed clinically appropriate. 2. Relatively severe chronic right-sided hydronephrosis, with diffuse dilatation of the right ureter. This may reflect mild mass-effect from the patient's apparent hydrosalpinges. No obstructing stone seen. 3. Prominent bilateral tubular cystic structures at the pelvis, which may reflect recurrent bilateral  hydrosalpinges, left larger than right. This could be further assessed on pelvic ultrasound, or on the follow-up study mentioned above. 4. Uterine fibroids again noted. 5. Mild splenomegaly. Electronically Signed   By: Garald Balding M.D.   On: 09/02/2015 00:48    Labs:  CBC:  Recent Labs  09/01/15 2321 09/02/15 0933 09/03/15 0443 09/03/15 1849 09/04/15 0458  WBC 11.9* 13.5* 17.7*  --  19.1*  HGB 3.8* 6.0* 6.2* 7.2* 6.8*  HCT 14.6* 21.2* 21.8* 24.5* 22.9*  PLT 272 280 292  --  326    COAGS:  Recent Labs  09/02/15 1050  INR 1.38    BMP:  Recent Labs  01/20/15 2249 09/01/15 2321 09/02/15 0933 09/03/15 0443  NA 137 132* 133* 134*  K 4.1 3.3* 3.7 4.3  CL 108 98* 97* 100*  CO2 23 25 25 26   GLUCOSE 124* 184* 171* 89  BUN 13 11 10 7   CALCIUM 8.9 8.4* 8.7* 8.8*  CREATININE 1.30* 1.25* 1.11* 1.07*  GFRNONAA 51* 53* >60 >60  GFRAA 59* >60 >60 >60    LIVER FUNCTION TESTS: No results for input(s): BILITOT, AST, ALT, ALKPHOS, PROT, ALBUMIN in the last 8760 hours.  Assessment and Plan: S/p drainage of left hydro/pyosalpinx 5/29; AF; WBC 19.1, hgb 6.8; check final cx's; monitor renal fxn- known rt hydro; cont drain for now; if pt d/c'd home soon can arrange for f/u in our IR clinic next week; pt still considering hysterectomy; other plans as per GYN/urology.   Electronically Signed: D. Lennette Bihari Allred 09/04/2015, 9:22 AM   I spent a total of 15 minutes at the the patient's bedside AND on  the patient's hospital floor or unit, greater than 50% of which was counseling/coordinating care for pelvic fluid collection drain

## 2015-09-04 NOTE — Progress Notes (Signed)
Subjective: Patient s/p IR drainage of Left hydrosalpinx with 273mL of purulent drainage on 5/29. She reports improvement in her pelvic pain, now mainly localized on the left side. She has persistent vaginal itching and reports frequent yeast infections with antibiotic treatment. She reports onset of vaginal spotting this morning.  Objective: I have reviewed patient's vital signs, intake and output, medications, labs and microbiology.  BP 129/73 mmHg  Pulse 77  Temp(Src) 98.4 F (36.9 C) (Oral)  Resp 14  Ht 5\' 4"  (1.626 m)  Wt 196 lb 12.2 oz (89.25 kg)  BMI 33.76 kg/m2  SpO2 97%  LMP 08/23/2015   Intake/Output Summary (Last 24 hours) at 09/03/15 0933 Last data filed at 09/03/15 0800  Gross per 24 hour  Intake 1431.2 ml  Output  520 ml  Net + 911.2 ml  Drain at 20 mL, mostly serosanguinous, but some purulent material in tube.  General: alert, cooperative and no distress Resp: clear to auscultation bilaterally Cardio: regular rate and rhythm, S1, S2 normal, no murmur, click, rub or gallop GI: soft, tender in the pelvic. No rebound tenderness or guarding. Bowel sounds active. Extremities: extremities normal, atraumatic, no cyanosis or edema and Homans sign is negative, no sign of DVT Patient has drain in left back. No significant bleeding from drain site.  Assessment/Plan: 1. Acute Blood loss anemia - Will start megace for cycle control - Continue iron replacement  2. UTI - Follow up cultures - Follow up with urology  3. Pyosalpinx - patient with Tmax 101.1 at 7pm 5/30 - monitor drain output per IR - Continue antibiotics: Norva Karvonen and Clinda - Continue inpatient care until afebrile for 48 hours - Discussed again definitive surgical treatment involving hysterectomy. Patient desires to discuss further with her gynecologist  4. DM2 and HTN - Continue current management      LOS: 2 day

## 2015-09-04 NOTE — Progress Notes (Signed)
PROGRESS NOTE                                                                                                                                                                                                             Patient Demographics:    Amy Lowe, is a 40 y.o. female, DOB - 12/24/1975, [id number]  Admit date - 09/01/2015   Admitting Physician [name], MD  Outpatient Primary MD for the patient is PROVIDER NOT [place]  LOS - 2  Chief Complaint  Patient presents with  . Flank Pain  . Abdominal Pain       Brief Narrative     Amy Lowe is a 40 y.o. female with medical history significant of with chronic and deficiency anemia previously received IV iron therapy, fibroid uterus, on diabetes mellitus, hypertension presents to the ER because of increasing pain in the abdomen on the left side over the last 2-3 days. The pain was radiating to her left groin. Had some nausea denies any vomiting or diarrhea. Denies any fever chills chest pain or shortness of breath. In the ER patient's hemoglobin was found to be around 3.8 and UA shows patient is consistent with UTI. CT abdomen and pelvis done shows bilateral hydrosalpinx with right-sided hydronephrosis and hydroureter.    Subjective:    [name] today has, No headache, No chest pain, Positive generalized abdominal pain but improving - No Nausea, No new weakness tingling or numbness, No Cough - SOB    Assessment  & Plan :     1.Abdominal pain due to bilateral hydrosalpinx and chronic right-sided hydronephrosis. Long-standing history of fibroid uterus with menorrhagia. Patient follows with GYN physician Dr. Katherine Mantle, she has been advised to undergo hysterectomy multiple times in the past.  She has been seen by both GYN and urology here, no urological procedures, she refuses hysterectomy again, OB consulted IR for left-sided hydrosalpinx  and drainage which was done on 09/02/2015 with 200 mL of pus drained, cultures pending, she has been placed by GYN on clindamycin and gentamicin for possible PID. Also Diflucan added for vagina candidiasis, Will monitor cultures and clinical response. Continue supportive care with IV fluids and pain control.   Discussed with Dr. Elly Modena OB/GYN on 09/04/2015, once she is afebrile place on PO Doxy-Flagyl for a total of 14  days. Followed by outpatient hysterectomy 6-8 weeks later.  Discussed with Dr. Roni Bread urologist, he will place a right-sided ureteric stent this admission.   2. Severe microcytic iron deficiency anemia. Due to menorrhagia, anemia panel pending, she is now status 3 units PRBC transfusion, initially refusing further transfusions but now agreeable we'll give her another unit on 09/04/2015. We'll place her on Iron once infections aren't better controlled.  3. Severe menorrhagia. She is currently on Megace to Tranexamic acid per GYN, GYN on case.  4. UTI. For now clindamycin and gentamicin. Follow cultures. No signs and symptoms of pyelonephritis at this time. Urology following him up planning for right-sided ureteric stent on 09/04/2015.  5. DM type II. Currently on Lantus and sliding scale will monitor.  No results found for: HGBA1C CBG (last 3)   Recent Labs  09/03/15 1714 09/03/15 2209 09/04/15 0741  GLUCAP 119* 176* 128*     Code Status :  Full  Family Communication  : None present  Disposition Plan  :  Stay in the hospital likely discharge in the morning  Consults  : OB, urology, IR  Procedures  :    CT abdomen and pelvis showing R.hydronephrosis and hydrosalpinx.  CT-guided left hydrosalpinx drainage by IR with 200 mL of pus removed.  DVT Prophylaxis  :    SCDs   Lab Results  Component Value Date   PLT 326 09/04/2015    Inpatient Medications  Scheduled Meds: . bisacodyl  10 mg Rectal Daily  . calcium carbonate  1 tablet Oral BID WC  . clindamycin  (CLEOCIN) IV  900 mg Intravenous Q8H  . dicyclomine  10 mg Oral TID AC  . gentamicin  7 mg/kg (Adjusted) Intravenous Q36H  . insulin aspart  0-9 Units Subcutaneous TID WC  . insulin aspart  8 Units Subcutaneous TID WC  . insulin detemir  18 Units Subcutaneous BID  . lisinopril  10 mg Oral Daily  . megestrol  20 mg Oral BID  . nystatin-triamcinolone   Topical BID  . polyethylene glycol  17 g Oral BID  . tranexamic acid  650 mg Oral TID   Continuous Infusions: . sodium chloride     PRN Meds:.acetaminophen **OR** [DISCONTINUED] acetaminophen, calcium carbonate, HYDROcodone-acetaminophen, HYDROmorphone (DILAUDID) injection, [DISCONTINUED] ondansetron **OR** ondansetron (ZOFRAN) IV  Antibiotics  :    Anti-infectives    Start     Dose/Rate Route Frequency Ordered Stop   09/04/15 0000  gentamicin (GARAMYCIN) 480 mg in dextrose 5 % 100 mL IVPB     7 mg/kg  68.9 kg (Adjusted) 112 mL/hr over 60 Minutes Intravenous Every 36 hours 09/02/15 2212     09/03/15 1000  fluconazole (DIFLUCAN) tablet 150 mg     150 mg Oral  Once 09/03/15 0930 09/03/15 1020   09/02/15 1200  gentamicin (GARAMYCIN) 480 mg in dextrose 5 % 100 mL IVPB  Status:  Discontinued     7 mg/kg  68.9 kg (Adjusted) 112 mL/hr over 60 Minutes Intravenous Every 24 hours 09/02/15 1024 09/02/15 2211   09/02/15 1130  clindamycin (CLEOCIN) IVPB 900 mg     900 mg 100 mL/hr over 30 Minutes Intravenous Every 8 hours 09/02/15 1001     09/02/15 0045  levofloxacin (LEVAQUIN) IVPB 500 mg     500 mg 100 mL/hr over 60 Minutes Intravenous  Once 09/02/15 0039 09/02/15 0336         Objective:   Filed Vitals:   09/03/15 2132 09/04/15 0036 09/04/15 0459 09/04/15 0500  BP: 129/64  129/73   Pulse: 88  77   Temp: 99.7 F (37.6 C) 99.9 F (37.7 C) 98.4 F (36.9 C)   TempSrc: Oral Oral Oral   Resp: 16  14   Height:      Weight:    91.037 kg (200 lb 11.2 oz)  SpO2: 98%  100%     Wt Readings from Last 3 Encounters:  09/04/15 91.037 kg  (200 lb 11.2 oz)     Intake/Output Summary (Last 24 hours) at 09/04/15 1000 Last data filed at 09/04/15 0700  Gross per 24 hour  Intake 1431.17 ml  Output     20 ml  Net 1411.17 ml     Physical Exam  Awake Alert, Oriented X 3, No new F.N deficits, Normal affect [place].AT,PERRAL Supple Neck,No JVD, No cervical lymphadenopathy appriciated.  Symmetrical Chest wall movement, Good air movement bilaterally, CTAB RRR,No Gallops,Rubs or new Murmurs, No Parasternal Heave +ve B.Sounds, Abd Soft, mild lower abd tenderness, No organomegaly appriciated, No rebound - guarding or rigidity. No Cyanosis, Clubbing or edema, No new Rash or bruise      Data Review:    CBC  Recent Labs Lab 09/01/15 2321 09/02/15 0933 09/03/15 0443 09/03/15 1849 09/04/15 0458  WBC 11.9* 13.5* 17.7*  --  19.1*  HGB 3.8* 6.0* 6.2* 7.2* 6.8*  HCT 14.6* 21.2* 21.8* 24.5* 22.9*  PLT 272 280 292  --  326  MCV 53.5* 59.7* 59.9*  --  63.1*  MCH 13.9* 16.9* 17.0*  --  18.7*  MCHC 26.0* 28.3* 28.4*  --  29.7*  RDW 21.5* 28.7* 29.6*  --  32.6*  LYMPHSABS 1.6  --   --   --   --   MONOABS 1.6*  --   --   --   --   EOSABS 0.0  --   --   --   --   BASOSABS 0.0  --   --   --   --     Chemistries   Recent Labs Lab 09/01/15 2321 09/02/15 0933 09/03/15 0443  NA 132* 133* 134*  K 3.3* 3.7 4.3  CL 98* 97* 100*  CO2 25 25 26   GLUCOSE 184* 171* 89  BUN 11 10 7   CREATININE 1.25* 1.11* 1.07*  CALCIUM 8.4* 8.7* 8.8*  MG  --   --  1.8   ------------------------------------------------------------------------------------------------------------------ No results for input(s): CHOL, HDL, LDLCALC, TRIG, CHOLHDL, LDLDIRECT in the last 72 hours.  No results found for: HGBA1C ------------------------------------------------------------------------------------------------------------------ No results for input(s): TSH, T4TOTAL, T3FREE, THYROIDAB in the last 72 hours.  Invalid input(s):  FREET3 ------------------------------------------------------------------------------------------------------------------  Recent Labs  09/02/15 0933  VITAMINB12 668  FOLATE 13.2  FERRITIN 22  TIBC 319  IRON 9*  RETICCTPCT <0.4*    Coagulation profile  Recent Labs Lab 09/02/15 1050  INR 1.38    No results for input(s): DDIMER in the last 72 hours.  Cardiac Enzymes No results for input(s): CKMB, TROPONINI, MYOGLOBIN in the last 168 hours.  Invalid input(s): CK ------------------------------------------------------------------------------------------------------------------ No results found for: BNP  Micro Results Recent Results (from the past 240 hour(s))  Urine C&S     Status: Abnormal   Collection Time: 09/01/15 10:32 PM  Result Value Ref Range Status   Specimen Description URINE, CLEAN CATCH  Final   Special Requests NONE  Final   Culture MULTIPLE SPECIES PRESENT, SUGGEST RECOLLECTION (A)  Final   Report Status 09/03/2015 FINAL  Final  Aerobic / Anaerobic Culture  Status: None (Preliminary result)   Collection Time: 09/02/15  6:25 PM  Result Value Ref Range Status   Specimen Description ABSCESS LEFT FALLOPIAN TUBE  Final   Special Requests Normal  Final   Gram Stain   Final    ABUNDANT WBC PRESENT,BOTH PMN AND MONONUCLEAR ABUNDANT GRAM POSITIVE COCCOBACILLUS    Culture   Final    NO GROWTH < 12 HOURS Performed at [facility]    Report Status PENDING  Incomplete  Culture, blood (Routine X 2) w Reflex to ID Panel     Status: None (Preliminary result)   Collection Time: 09/03/15  6:54 PM  Result Value Ref Range Status   Specimen Description BLOOD RIGHT ANTECUBITAL  Final   Special Requests BOTTLES DRAWN AEROBIC AND ANAEROBIC 4CC  Final   Culture PENDING  Incomplete   Report Status PENDING  Incomplete    Radiology Reports Nm Renal Imaging Flow W/pharm  09/03/2015  CLINICAL DATA:  RIGHT hydronephrosis EXAM: NUCLEAR MEDICINE RENAL SCAN WITH  DIURETIC ADMINISTRATION TECHNIQUE: Radionuclide angiographic and sequential renal images were obtained after intravenous injection of radiopharmaceutical. Imaging was continued during slow intravenous injection of Lasix approximately 15 minutes after the start of the examination. RADIOPHARMACEUTICALS:  15.6 mCi Technetium-53m MAG3 IV COMPARISON:  CT 09/02/2015 FINDINGS: Flow: Delayed arterial flow to the RIGHT kidney compared to the LEFT. Left renogram: Uniform uptake of counts in the renal cortex. Counts are promptly excreted into the collecting system and cleared prior to administration of Lasix. Lasix augment clearance. No postvoid residual. Right renogram: Delayed cortical uptake in the RIGHT kidney. RIGHT renal cortex is thinned and hydronephrosis is present. There is minimal excretion into the RIGHT renal collecting system. The RIGHT ureter is not visualized directly. Postvoid renal cortical activity and renal pelvis residual. Differential: Left kidney = 77 % Right kidney = 23 % T1/2 post Lasix : Left kidney = 3.7 minutes (counts clear significantly prior to administration of Lasix) Right kidney = no T 1/2 post lasix reached IMPRESSION: 1. Severe obstructive hydronephrosis of the RIGHT kidney. 2. Asymmetric renal differential as above. Electronically Signed   By: Suzy Bouchard M.D.   On: 09/03/2015 14:43   US Transvaginal Non-ob  09/03/2015  CLINICAL DATA:  Abdominal pain EXAM: TRANSABDOMINAL AND TRANSVAGINAL ULTRASOUND OF PELVIS TECHNIQUE: Study was performed transabdominally to optimize pelvic field of view evaluation and transvaginally to optimize internal visceral architecture evaluation. COMPARISON:  CT abdomen and pelvis Sep 02, 2015 FINDINGS: Uterus Measurements: 12.3 x 7.7 x 8.3 cm. The echogenicity of the uterus is diffusely inhomogeneous consistent with leiomyomatous change. There is a dominant mass in the midportion of the uterus anteriorly measuring 6.6 x 5.4 x 6.2 cm. Along the superior  aspect of the uterus, there is a 3.0 x 3.7 x 3.4 cm mass. These lesions are felt to be consistent with leiomyomas. Endometrium Thickness: 14 mm.  Dominant uterine mass impresses upon endometrium. Right ovary Ovarian appearing tissue is not evident by transabdominal or transvaginal technique. There is a complex tubular appearing lesion in the right adnexal area, concerning for potential hydrosalpinx. Left ovary Ovarian appearing tissue is not evident by transabdominal or transvaginal technique. There is a complex cystic area measuring 13.2 x 6.7 x 5.5 cm 5.5 cm in the left adnexal region with tubular appearing cystic fluid noted in this area as well. Other findings Minimal free fluid noted. IMPRESSION: Complex cystic structure left adnexa. Suspect bilateral hydrosalpinx. Prominent uterus with leiomyomatous change. Dominant leiomyoma measuring 6.6 x 5.4 x  6.2 cm. The current sonographic findings are commensurate with the CT findings of 1 day prior. Given the difficulty in discerning discrete extrauterine structures, correlation with pelvic MR may be reasonable in this circumstance. Electronically Signed   By: Lowella Grip III M.D.   On: 09/03/2015 16:27   US Pelvis Complete  09/03/2015  CLINICAL DATA:  Abdominal pain EXAM: TRANSABDOMINAL AND TRANSVAGINAL ULTRASOUND OF PELVIS TECHNIQUE: Study was performed transabdominally to optimize pelvic field of view evaluation and transvaginally to optimize internal visceral architecture evaluation. COMPARISON:  CT abdomen and pelvis Sep 02, 2015 FINDINGS: Uterus Measurements: 12.3 x 7.7 x 8.3 cm. The echogenicity of the uterus is diffusely inhomogeneous consistent with leiomyomatous change. There is a dominant mass in the midportion of the uterus anteriorly measuring 6.6 x 5.4 x 6.2 cm. Along the superior aspect of the uterus, there is a 3.0 x 3.7 x 3.4 cm mass. These lesions are felt to be consistent with leiomyomas. Endometrium Thickness: 14 mm.  Dominant uterine mass  impresses upon endometrium. Right ovary Ovarian appearing tissue is not evident by transabdominal or transvaginal technique. There is a complex tubular appearing lesion in the right adnexal area, concerning for potential hydrosalpinx. Left ovary Ovarian appearing tissue is not evident by transabdominal or transvaginal technique. There is a complex cystic area measuring 13.2 x 6.7 x 5.5 cm 5.5 cm in the left adnexal region with tubular appearing cystic fluid noted in this area as well. Other findings Minimal free fluid noted. IMPRESSION: Complex cystic structure left adnexa. Suspect bilateral hydrosalpinx. Prominent uterus with leiomyomatous change. Dominant leiomyoma measuring 6.6 x 5.4 x 6.2 cm. The current sonographic findings are commensurate with the CT findings of 1 day prior. Given the difficulty in discerning discrete extrauterine structures, correlation with pelvic MR may be reasonable in this circumstance. Electronically Signed   By: Lowella Grip III M.D.   On: 09/03/2015 16:27   Ct Renal Stone Study  09/02/2015  CLINICAL DATA:  Left flank and abdominal pain, acute onset. Right blood cells and Throne blood cells in the urine. Initial encounter. EXAM: CT ABDOMEN AND PELVIS WITHOUT CONTRAST TECHNIQUE: Multidetector CT imaging of the abdomen and pelvis was performed following the standard protocol without IV contrast. COMPARISON:  Pelvic ultrasound performed 01/21/2015 FINDINGS: The visualized lung bases are clear. The liver is unremarkable in appearance. The spleen is mildly enlarged, measuring 13.7 cm in length. The gallbladder is within normal limits. The pancreas and adrenal glands are unremarkable. There is relatively severe chronic right-sided hydronephrosis, with diffuse dilatation of the right ureter. No distal obstructing stone is seen. This may reflect mass effect from the patient's hydrosalpinges. The left kidney is grossly unremarkable in appearance. Nonspecific perinephric stranding is  noted bilaterally. No nonobstructing renal stones are identified. Prominent retroperitoneal nodes measure up to 1.2 cm in short axis. These are difficult to fully assess without contrast. No free fluid is identified. The small bowel is unremarkable in appearance. The stomach is within normal limits. No acute vascular abnormalities are seen. Minimal vascular calcifications are seen. The appendix is normal in caliber, without evidence of appendicitis. The colon is grossly unremarkable in appearance. The bladder is mildly distended and grossly unremarkable. An apparent 5 cm fibroid is noted within the uterus. A likely exophytic fibroid is seen on the right. Prominent bilateral tubular cystic structures are noted at the pelvis, which may reflect recurrent bilateral hydrosalpinges, left larger than right. The ovaries are not well assessed. No inguinal lymphadenopathy is seen. No acute osseous abnormalities  are identified. IMPRESSION: 1. Prominent retroperitoneal nodes measure up to 1.2 cm in short axis. These are difficult to fully assess without contrast. Malignancy cannot be entirely excluded. Would correlate with the patient's history, and consider contrast-enhanced CT or PET/CT when and as deemed clinically appropriate. 2. Relatively severe chronic right-sided hydronephrosis, with diffuse dilatation of the right ureter. This may reflect mild mass-effect from the patient's apparent hydrosalpinges. No obstructing stone seen. 3. Prominent bilateral tubular cystic structures at the pelvis, which may reflect recurrent bilateral hydrosalpinges, left larger than right. This could be further assessed on pelvic ultrasound, or on the follow-up study mentioned above. 4. Uterine fibroids again noted. 5. Mild splenomegaly. Electronically Signed   By: Garald Balding M.D.   On: 09/02/2015 00:48    Time Spent in minutes  30   Monterius Rolf K M.D on 09/04/2015 at 10:00 AM  Between 7am to 7pm - Pager - [phone]  After  7pm go to [email or web address] - password [facility]  [facility] -  Office  [phone]

## 2015-09-04 NOTE — Progress Notes (Addendum)
Patient ID: Amy Lowe, female   DOB: 11-21-1975, 40 y.o.   MRN: [id number]    Subjective: Amy Lowe is s/p percutaneous drainage of a pyosalpinx.   She continues to spike fevers but the curve is declining.   She has some residual pelvic spasms but no right flank pain.  He urine culture grew Mx species which is consistent with a contaminant.   She had a lasix renogram yesterday which shows marked obstruction on the right with 23% differential function and some cortical thinning.   ROS:  Review of Systems  Constitutional: Positive for fever, chills and malaise/fatigue.  Gastrointestinal: Positive for abdominal pain.  Genitourinary: Negative for flank pain.  Psychiatric/Behavioral: Positive for depression.    Anti-infectives: Anti-infectives    Start     Dose/Rate Route Frequency Ordered Stop   09/04/15 0000  gentamicin (GARAMYCIN) 480 mg in dextrose 5 % 100 mL IVPB     7 mg/kg  68.9 kg (Adjusted) 112 mL/hr over 60 Minutes Intravenous Every 36 hours 09/02/15 2212     09/03/15 1000  fluconazole (DIFLUCAN) tablet 150 mg     150 mg Oral  Once 09/03/15 0930 09/03/15 1020   09/02/15 1200  gentamicin (GARAMYCIN) 480 mg in dextrose 5 % 100 mL IVPB  Status:  Discontinued     7 mg/kg  68.9 kg (Adjusted) 112 mL/hr over 60 Minutes Intravenous Every 24 hours 09/02/15 1024 09/02/15 2211   09/02/15 1130  clindamycin (CLEOCIN) IVPB 900 mg     900 mg 100 mL/hr over 30 Minutes Intravenous Every 8 hours 09/02/15 1001     09/02/15 0045  levofloxacin (LEVAQUIN) IVPB 500 mg     500 mg 100 mL/hr over 60 Minutes Intravenous  Once 09/02/15 0039 09/02/15 0336      Current Facility-Administered Medications  Medication Dose Route Frequency Provider Last Rate Last Dose  . 0.9 %  sodium chloride infusion   Intravenous Continuous [name], MD 50 mL/hr at 09/03/15 1727    . acetaminophen (TYLENOL) tablet 650 mg  650 mg Oral Q6H PRN [name], MD   650 mg at 09/03/15 1727  . bisacodyl (DULCOLAX)  suppository 10 mg  10 mg Rectal Daily [name], MD   10 mg at 09/03/15 1347  . calcium carbonate (TUMS - dosed in mg elemental calcium) chewable tablet 200 mg of elemental calcium  1 tablet Oral BID WC [name], MD   200 mg of elemental calcium at 09/03/15 1611  . calcium carbonate (TUMS - dosed in mg elemental calcium) chewable tablet 400 mg of elemental calcium  400 mg of elemental calcium Oral TID PRN Rhetta Mura Schorr, NP   400 mg of elemental calcium at 09/03/15 0023  . clindamycin (CLEOCIN) IVPB 900 mg  900 mg Intravenous Q8H Donnamae Jude, MD   900 mg at 09/04/15 0501  . dicyclomine (BENTYL) capsule 10 mg  10 mg Oral TID AC Tanna Savoy Stinson, DO   10 mg at 09/03/15 1611  . gentamicin (GARAMYCIN) 480 mg in dextrose 5 % 100 mL IVPB  7 mg/kg (Adjusted) Intravenous Q36H [name], MD   480 mg at 09/04/15 0034  . HYDROcodone-acetaminophen (NORCO/VICODIN) 5-325 MG per tablet 1 tablet  1 tablet Oral Q4H PRN [name], MD   1 tablet at 09/04/15 0500  . HYDROmorphone (DILAUDID) injection 1-2 mg  1-2 mg Intravenous Q3H PRN Donnamae Jude, MD   2 mg at 09/03/15 1348  . insulin  aspart (novoLOG) injection 0-9 Units  0-9 Units Subcutaneous TID WC [name], MD   5 Units at 09/02/15 [id number]  . insulin aspart (novoLOG) injection 8 Units  8 Units Subcutaneous TID WC [name], MD   8 Units at 09/03/15 1727  . insulin detemir (LEVEMIR) injection 18 Units  18 Units Subcutaneous BID [name], MD   18 Units at 09/03/15 2223  . lisinopril (PRINIVIL,ZESTRIL) tablet 10 mg  10 mg Oral Daily [name], MD   10 mg at 09/03/15 1019  . ondansetron (ZOFRAN) injection 4 mg  4 mg Intravenous Q6H PRN [name], MD   4 mg at 09/02/15 0353  . polyethylene glycol (MIRALAX / GLYCOLAX) packet 17 g  17 g Oral BID [name], MD   17 g at 09/03/15 2223  . tranexamic acid (LYSTEDA) tablet 650 mg  650 mg Oral TID [name], MD   650 mg at 09/03/15  2223     Objective: Vital signs in last 24 hours: Temp:  [98.4 F (36.9 C)-101.1 F (38.4 C)] 98.4 F (36.9 C) (05/31 0459) Pulse Rate:  [77-93] 77 (05/31 0459) Resp:  [14-18] 14 (05/31 0459) BP: (128-134)/(53-73) 129/73 mmHg (05/31 0459) SpO2:  [98 %-100 %] 100 % (05/31 0459) Weight:  [91.037 kg (200 lb 11.2 oz)] 91.037 kg (200 lb 11.2 oz) (05/31 0500)  Intake/Output from previous day: 05/30 0701 - 05/31 0700 In: 1431.2 [I.V.:829.2; Blood:335; IV Piggyback:262] Out: 67 [Urine:500; Drains:20] Intake/Output this shift:     Physical Exam  Constitutional: She is well-developed, well-nourished, and in no distress.  Abdominal: Soft. There is tenderness (suprapubic and right CVAT that is moderate with some guarding).  Psychiatric:  She has a depressed mood and flat affect    Lab Results:   Recent Labs  09/03/15 0443 09/03/15 1849 09/04/15 0458  WBC 17.7*  --  19.1*  HGB 6.2* 7.2* 6.8*  HCT 21.8* 24.5* 22.9*  PLT 292  --  326   BMET  Recent Labs  09/02/15 0933 09/03/15 0443  NA 133* 134*  K 3.7 4.3  CL 97* 100*  CO2 25 26  GLUCOSE 171* 89  BUN 10 7  CREATININE 1.11* 1.07*  CALCIUM 8.7* 8.8*   PT/INR  Recent Labs  09/02/15 1050  LABPROT 17.0*  INR 1.38   ABG No results for input(s): PHART, HCO3 in the last 72 hours.  Invalid input(s): PCO2, PO2  Studies/Results: Nm Renal Imaging Flow W/pharm  09/03/2015  CLINICAL DATA:  RIGHT hydronephrosis EXAM: NUCLEAR MEDICINE RENAL SCAN WITH DIURETIC ADMINISTRATION TECHNIQUE: Radionuclide angiographic and sequential renal images were obtained after intravenous injection of radiopharmaceutical. Imaging was continued during slow intravenous injection of Lasix approximately 15 minutes after the start of the examination. RADIOPHARMACEUTICALS:  15.6 mCi Technetium-86m MAG3 IV COMPARISON:  CT 09/02/2015 FINDINGS: Flow: Delayed arterial flow to the RIGHT kidney compared to the LEFT. Left renogram: Uniform uptake of  counts in the renal cortex. Counts are promptly excreted into the collecting system and cleared prior to administration of Lasix. Lasix augment clearance. No postvoid residual. Right renogram: Delayed cortical uptake in the RIGHT kidney. RIGHT renal cortex is thinned and hydronephrosis is present. There is minimal excretion into the RIGHT renal collecting system. The RIGHT ureter is not visualized directly. Postvoid renal cortical activity and renal pelvis residual. Differential: Left kidney = 77 % Right kidney = 23 % T1/2 post Lasix : Left kidney = 3.7 minutes (counts clear significantly prior  to administration of Lasix) Right kidney = no T 1/2 post lasix reached IMPRESSION: 1. Severe obstructive hydronephrosis of the RIGHT kidney. 2. Asymmetric renal differential as above. Electronically Signed   By: Suzy Bouchard M.D.   On: 09/03/2015 14:43   US Transvaginal Non-ob  09/03/2015  CLINICAL DATA:  Abdominal pain EXAM: TRANSABDOMINAL AND TRANSVAGINAL ULTRASOUND OF PELVIS TECHNIQUE: Study was performed transabdominally to optimize pelvic field of view evaluation and transvaginally to optimize internal visceral architecture evaluation. COMPARISON:  CT abdomen and pelvis Sep 02, 2015 FINDINGS: Uterus Measurements: 12.3 x 7.7 x 8.3 cm. The echogenicity of the uterus is diffusely inhomogeneous consistent with leiomyomatous change. There is a dominant mass in the midportion of the uterus anteriorly measuring 6.6 x 5.4 x 6.2 cm. Along the superior aspect of the uterus, there is a 3.0 x 3.7 x 3.4 cm mass. These lesions are felt to be consistent with leiomyomas. Endometrium Thickness: 14 mm.  Dominant uterine mass impresses upon endometrium. Right ovary Ovarian appearing tissue is not evident by transabdominal or transvaginal technique. There is a complex tubular appearing lesion in the right adnexal area, concerning for potential hydrosalpinx. Left ovary Ovarian appearing tissue is not evident by transabdominal or  transvaginal technique. There is a complex cystic area measuring 13.2 x 6.7 x 5.5 cm 5.5 cm in the left adnexal region with tubular appearing cystic fluid noted in this area as well. Other findings Minimal free fluid noted. IMPRESSION: Complex cystic structure left adnexa. Suspect bilateral hydrosalpinx. Prominent uterus with leiomyomatous change. Dominant leiomyoma measuring 6.6 x 5.4 x 6.2 cm. The current sonographic findings are commensurate with the CT findings of 1 day prior. Given the difficulty in discerning discrete extrauterine structures, correlation with pelvic MR may be reasonable in this circumstance. Electronically Signed   By: Lowella Grip III M.D.   On: 09/03/2015 16:27   US Pelvis Complete  09/03/2015  CLINICAL DATA:  Abdominal pain EXAM: TRANSABDOMINAL AND TRANSVAGINAL ULTRASOUND OF PELVIS TECHNIQUE: Study was performed transabdominally to optimize pelvic field of view evaluation and transvaginally to optimize internal visceral architecture evaluation. COMPARISON:  CT abdomen and pelvis Sep 02, 2015 FINDINGS: Uterus Measurements: 12.3 x 7.7 x 8.3 cm. The echogenicity of the uterus is diffusely inhomogeneous consistent with leiomyomatous change. There is a dominant mass in the midportion of the uterus anteriorly measuring 6.6 x 5.4 x 6.2 cm. Along the superior aspect of the uterus, there is a 3.0 x 3.7 x 3.4 cm mass. These lesions are felt to be consistent with leiomyomas. Endometrium Thickness: 14 mm.  Dominant uterine mass impresses upon endometrium. Right ovary Ovarian appearing tissue is not evident by transabdominal or transvaginal technique. There is a complex tubular appearing lesion in the right adnexal area, concerning for potential hydrosalpinx. Left ovary Ovarian appearing tissue is not evident by transabdominal or transvaginal technique. There is a complex cystic area measuring 13.2 x 6.7 x 5.5 cm 5.5 cm in the left adnexal region with tubular appearing cystic fluid noted in this  area as well. Other findings Minimal free fluid noted. IMPRESSION: Complex cystic structure left adnexa. Suspect bilateral hydrosalpinx. Prominent uterus with leiomyomatous change. Dominant leiomyoma measuring 6.6 x 5.4 x 6.2 cm. The current sonographic findings are commensurate with the CT findings of 1 day prior. Given the difficulty in discerning discrete extrauterine structures, correlation with pelvic MR may be reasonable in this circumstance. Electronically Signed   By: Lowella Grip III M.D.   On: 09/03/2015 16:27   Labs and renogram reviewed.  Assessment and Plan: Left pyosalpinx with fever post drainage.   Severe right ureteral obstruction with renal atrophy and reduced function.   Her culture just had Mx species.   She needs to have the right kidney decompressed either with a stent or a perc, but she is still trying to decide whether to proceed with a hysterectomy and I am reluctant to place a stent until that decision is made because if she refuses hysterectomy we would be committing her to long term stenting, which she may still need, but I am hopeful she will decide to proceed with hysterectomy and then we can stent depending on when that is likely to be done.   I reviewed the risks of stenting including bleeding, infection, injury to the ureter and need for a perc and possible ureteral reimplantation.      I will continue to follow.    I spoke to Dr. Candiss Norse and the plan is to do a hysterectomy in about 6 weeks.  With that delay I will got ahead and get up for cystoscopy and right stent placement tomorrow. I have notified Amy Lowe of the plan and she is agreeable.      LOS: 2 days    Amy Lowe 09/04/2015 [id number]

## 2015-09-05 ENCOUNTER — Inpatient Hospital Stay (HOSPITAL_COMMUNITY): Payer: BLUE CROSS/BLUE SHIELD | Admitting: Anesthesiology

## 2015-09-05 ENCOUNTER — Encounter (HOSPITAL_COMMUNITY)
Admission: EM | Disposition: A | Discharge status: Home / Self Care | Payer: Self-pay | Source: Home / Self Care | Attending: Internal Medicine

## 2015-09-05 HISTORY — PX: CYSTOSCOPY W/ URETERAL STENT PLACEMENT: SHX1429

## 2015-09-05 LAB — BASIC METABOLIC PANEL
ANION GAP: 9 (ref 5–15)
BUN: 12 mg/dL (ref 6–20)
CALCIUM: 8.7 mg/dL — AB (ref 8.9–10.3)
CO2: 27 mmol/L (ref 22–32)
CREATININE: 1.09 mg/dL — AB (ref 0.44–1.00)
Chloride: 96 mmol/L — ABNORMAL LOW (ref 101–111)
Glucose, Bld: 156 mg/dL — ABNORMAL HIGH (ref 65–99)
Potassium: 4 mmol/L (ref 3.5–5.1)
Sodium: 132 mmol/L — ABNORMAL LOW (ref 135–145)

## 2015-09-05 LAB — HEMOGLOBIN AND HEMATOCRIT, BLOOD
HCT: 29 % — ABNORMAL LOW (ref 36.0–46.0)
Hemoglobin: 8.8 g/dL — ABNORMAL LOW (ref 12.0–15.0)

## 2015-09-05 LAB — CBC
HCT: 27.1 % — ABNORMAL LOW (ref 36.0–46.0)
HEMOGLOBIN: 8.3 g/dL — AB (ref 12.0–15.0)
MCH: 19.7 pg — ABNORMAL LOW (ref 26.0–34.0)
MCHC: 30.6 g/dL (ref 30.0–36.0)
MCV: 64.2 fL — ABNORMAL LOW (ref 78.0–100.0)
PLATELETS: 385 10*3/uL (ref 150–400)
RBC: 4.22 MIL/uL (ref 3.87–5.11)
WBC: 18.4 10*3/uL — ABNORMAL HIGH (ref 4.0–10.5)

## 2015-09-05 LAB — GLUCOSE, CAPILLARY
GLUCOSE-CAPILLARY: 140 mg/dL — AB (ref 65–99)
GLUCOSE-CAPILLARY: 165 mg/dL — AB (ref 65–99)
GLUCOSE-CAPILLARY: 164 mg/dL — AB (ref 65–99)
Glucose-Capillary: 137 mg/dL — ABNORMAL HIGH (ref 65–99)

## 2015-09-05 LAB — SURGICAL PCR SCREEN
MRSA, PCR: NEGATIVE
STAPHYLOCOCCUS AUREUS: NEGATIVE

## 2015-09-05 LAB — MAGNESIUM: MAGNESIUM: 1.9 mg/dL (ref 1.7–2.4)

## 2015-09-05 SURGERY — CYSTOSCOPY, WITH RETROGRADE PYELOGRAM AND URETERAL STENT INSERTION
Anesthesia: General | Laterality: Right

## 2015-09-05 MED ORDER — ONDANSETRON HCL 4 MG/2ML IJ SOLN
INTRAMUSCULAR | Status: AC
  Filled 2015-09-05: qty 2

## 2015-09-05 MED ORDER — HYDROMORPHONE HCL 1 MG/ML IJ SOLN
INTRAMUSCULAR | Status: AC
  Filled 2015-09-05: qty 1

## 2015-09-05 MED ORDER — IOPAMIDOL (ISOVUE-300) INJECTION 61%
INTRAVENOUS | Status: AC
  Filled 2015-09-05: qty 50

## 2015-09-05 MED ORDER — MEPERIDINE HCL 50 MG/ML IJ SOLN: 6.2500 mg | INTRAMUSCULAR | Status: DC | PRN

## 2015-09-05 MED ORDER — MIDAZOLAM HCL 5 MG/5ML IJ SOLN
INTRAMUSCULAR | Status: DC | PRN
  Administered 2015-09-05: 2 mg via INTRAVENOUS

## 2015-09-05 MED ORDER — PROPOFOL 10 MG/ML IV BOLUS
INTRAVENOUS | Status: AC
  Filled 2015-09-05: qty 20

## 2015-09-05 MED ORDER — OXYCODONE HCL 5 MG PO TABS: 5.0000 mg | ORAL_TABLET | Freq: Once | ORAL | Status: DC | PRN

## 2015-09-05 MED ORDER — DIATRIZOATE MEGLUMINE 30 % UR SOLN
URETHRAL | Status: DC | PRN
  Administered 2015-09-05: 20 mL via URETHRAL

## 2015-09-05 MED ORDER — GENTAMICIN SULFATE 40 MG/ML IJ SOLN
137.4000 mg | INTRAVENOUS | Status: DC | PRN
  Administered 2015-09-05: 480 mg via INTRAVENOUS

## 2015-09-05 MED ORDER — PROPOFOL 10 MG/ML IV BOLUS
INTRAVENOUS | Status: DC | PRN
  Administered 2015-09-05: 200 mg via INTRAVENOUS

## 2015-09-05 MED ORDER — DIATRIZOATE MEGLUMINE 30 % UR SOLN
URETHRAL | Status: AC
  Filled 2015-09-05: qty 100

## 2015-09-05 MED ORDER — SODIUM CHLORIDE 0.9 % IV SOLN
INTRAVENOUS | Status: DC
  Administered 2015-09-05 (×2): via INTRAVENOUS

## 2015-09-05 MED ORDER — KETOROLAC TROMETHAMINE 30 MG/ML IJ SOLN
INTRAMUSCULAR | Status: AC
  Filled 2015-09-05: qty 1

## 2015-09-05 MED ORDER — HYDROMORPHONE HCL 1 MG/ML IJ SOLN
0.2500 mg | INTRAMUSCULAR | Status: DC | PRN
  Administered 2015-09-05 (×2): 0.5 mg via INTRAVENOUS

## 2015-09-05 MED ORDER — CLINDAMYCIN PHOSPHATE 900 MG/50ML IV SOLN
INTRAVENOUS | Status: AC
  Filled 2015-09-05: qty 50

## 2015-09-05 MED ORDER — MIDAZOLAM HCL 2 MG/2ML IJ SOLN
INTRAMUSCULAR | Status: AC
  Filled 2015-09-05: qty 2

## 2015-09-05 MED ORDER — FENTANYL CITRATE (PF) 100 MCG/2ML IJ SOLN
INTRAMUSCULAR | Status: DC | PRN
  Administered 2015-09-05: 50 ug via INTRAVENOUS

## 2015-09-05 MED ORDER — ONDANSETRON HCL 4 MG/2ML IJ SOLN
INTRAMUSCULAR | Status: DC | PRN
  Administered 2015-09-05: 4 mg via INTRAVENOUS

## 2015-09-05 MED ORDER — OXYCODONE HCL 5 MG/5ML PO SOLN
5.0000 mg | Freq: Once | ORAL | Status: DC | PRN
  Filled 2015-09-05: qty 5

## 2015-09-05 MED ORDER — FENTANYL CITRATE (PF) 100 MCG/2ML IJ SOLN
INTRAMUSCULAR | Status: AC
  Filled 2015-09-05: qty 2

## 2015-09-05 MED ORDER — LIDOCAINE HCL (CARDIAC) 20 MG/ML IV SOLN
INTRAVENOUS | Status: DC | PRN
  Administered 2015-09-05: 100 mg via INTRAVENOUS

## 2015-09-05 MED ORDER — STERILE WATER FOR IRRIGATION IR SOLN
Status: DC | PRN
  Administered 2015-09-05: 3000 mL

## 2015-09-05 MED ORDER — LIDOCAINE HCL (CARDIAC) 20 MG/ML IV SOLN
INTRAVENOUS | Status: AC
  Filled 2015-09-05: qty 5

## 2015-09-05 MED ORDER — LACTATED RINGERS IV SOLN
INTRAVENOUS | Status: DC | PRN
  Administered 2015-09-05: 12:00:00 via INTRAVENOUS

## 2015-09-05 SURGICAL SUPPLY — 13 items
BAG URO CATCHER STRL LF (MISCELLANEOUS) ×3 IMPLANT
CATH INTERMIT  6FR 70CM (CATHETERS) ×2 IMPLANT
CATH URET 5FR 28IN OPEN ENDED (CATHETERS) ×2 IMPLANT
CLOTH BEACON ORANGE TIMEOUT ST (SAFETY) ×3 IMPLANT
GLOVE SURG SS PI 8.0 STRL IVOR (GLOVE) IMPLANT
GOWN STRL REUS W/TWL XL LVL3 (GOWN DISPOSABLE) ×3 IMPLANT
GUIDEWIRE STR DUAL SENSOR (WIRE) ×3 IMPLANT
GUIDEWIRE SUPER STIFF (WIRE) ×2 IMPLANT
MANIFOLD NEPTUNE II (INSTRUMENTS) ×3 IMPLANT
PACK CYSTO (CUSTOM PROCEDURE TRAY) ×3 IMPLANT
STENT URET 6FRX26 CONTOUR (STENTS) ×2 IMPLANT
TUBING CONNECTING 10 (TUBING) ×2 IMPLANT
TUBING CONNECTING 10' (TUBING) ×1

## 2015-09-05 NOTE — Progress Notes (Signed)
Pharmacy Antibiotic Note  Amy Lowe is a 40 y.o. 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  Pharmacy has been consulted for gentamicin dosing.  Also on clindamycin.  Anaphylaxis to PCN (no documented C-sporin use).  Cultures unrevealing to date  Today, 09/05/2015: Day #4 therapy  Scr stable (no strict UOP)  Afebrile  WBC remains elevated  Gentamicin 480mg charted at 1125 5/29, gentamicin level drawn at 1947 5/29--8 hr 27mins in between. Using [place] nomogram, the level of 8.3 lies within the q36hr dosing area.  Plan:  Continue gentamicin 480mg  q36hr dosing   Consider recheck random level Saturday am if remain on gent  BMP ordered daily until 6/3 am  Height: 5\' 4"  (162.6 cm) Weight: 202 lb (91.627 kg) IBW/kg (Calculated) : 54.7  Temp (24hrs), Avg:98.6 F (37 C), Min:98.1 F (36.7 C), Max:99.2 F (37.3 C)   Recent Labs Lab 09/01/15 2321 09/02/15 0933 09/02/15 1947 09/03/15 0443 09/04/15 0458 09/05/15 0522  WBC 11.9* 13.5*  --  17.7* 19.1* 18.4*  CREATININE 1.25* 1.11*  --  1.07*  --  1.09*  GENTRANDOM  --   --  8.3  --   --   --     Estimated Creatinine Clearance: 74.5 mL/min (by C-G formula based on Cr of 1.09).     Antimicrobials this admission: Gentamicin 5/29 >> Clindamycin 5/29  Dose adjustments this admission: 5/29 2000 Random gent level w/ first dose gent 480mg  = 8.3 at 8.5 hrs 1125 gent; 1947 level  Microbiology results: 5/29 Abscess left fallopian tube: aerobic / anaerobic: NG 5/28 Urine: multiple species  5/30 BCx: NGTD  Thank you for allowing pharmacy to be a part of this patient's care.  Doreene Eland, PharmD, BCPS.   Pager: [phone] 09/05/2015 10:36 AM

## 2015-09-05 NOTE — Anesthesia Preprocedure Evaluation (Signed)
Anesthesia Evaluation  Patient identified by MRN, date of birth, ID band Patient awake    Reviewed: Allergy & Precautions, NPO status   Airway Mallampati: I  TM Distance: >3 FB Neck ROM: Full    Dental  (+) Dental Advisory Given, Teeth Intact   Pulmonary Current Smoker,    breath sounds clear to auscultation       Cardiovascular  Rhythm:Regular Rate:Normal     Neuro/Psych    GI/Hepatic   Endo/Other  diabetes, Well Controlled, Type 2, Insulin DependentMorbid obesity  Renal/GU      Musculoskeletal   Abdominal   Peds  Hematology   Anesthesia Other Findings   Reproductive/Obstetrics                             Anesthesia Physical Anesthesia Plan  ASA: III  Anesthesia Plan: General   Post-op Pain Management:    Induction: Intravenous  Airway Management Planned: LMA  Additional Equipment:   Intra-op Plan:   Post-operative Plan: Extubation in OR  Informed Consent: I have reviewed the patients History and Physical, chart, labs and discussed the procedure including the risks, benefits and alternatives for the proposed anesthesia with the patient or authorized representative who has indicated his/her understanding and acceptance.   Dental advisory given  Plan Discussed with: CRNA, Anesthesiologist and Surgeon  Anesthesia Plan Comments:         Anesthesia Quick Evaluation

## 2015-09-05 NOTE — Anesthesia Procedure Notes (Signed)
Procedure Name: LMA Insertion Date/Time: 09/05/2015 12:41 PM Performed by: Bethena Roys T Pre-anesthesia Checklist: Patient identified, Emergency Drugs available, Suction available and Patient being monitored Patient Re-evaluated:Patient Re-evaluated prior to inductionOxygen Delivery Method: Circle system utilized Preoxygenation: Pre-oxygenation with 100% oxygen Intubation Type: IV induction LMA: LMA inserted LMA Size: 4.0 Number of attempts: 1 Airway Equipment and Method: Bite block Placement Confirmation: positive ETCO2 Tube secured with: Tape Dental Injury: Teeth and Oropharynx as per pre-operative assessment

## 2015-09-05 NOTE — Consult Note (Addendum)
     Subjective: Patient reports resolved bleeding, and she feels better. She is interviewed briefly before going for ureteral stent placement by Dr Jeffie Pollock..  She reports speaking by phone to Dr Earleen Newport , at [facility].[phone]   Objective: I have reviewed patient's vital signs, medications, labs, microbiology and radiology results. BP 139/67 mmHg  Pulse 85  Temp(Src) 98.8 F (37.1 C) (Oral)  Resp 16  Ht 5\' 4"  (1.626 m)  Wt 91.627 kg (202 lb)  BMI 34.66 kg/m2  SpO2 100%  LMP 08/23/2015 Temp has trended downward x last 72 hr , now afebrile x 24 hrs. Cultures : negative so far, G+ cocci on gram stain CBC Latest Ref Rng 09/05/2015 09/04/2015 09/04/2015  WBC 4.0 - 10.5 K/uL 18.4(H) - 19.1(H)  Hemoglobin 12.0 - 15.0 g/dL 8.3(L) 8.8(L) 6.8(LL)  Hematocrit 36.0 - 46.0 % 27.1(L) 29.0(L) 22.9(L)  Platelets 150 - 400 K/uL 385 - 326    General: alert, cooperative, appears stated age and mild distress GI: normal findings: moderate abd fullness, and suprapubic tender mass consistent with fibroid enlargement of uterus with adjacent absecess, abnormal findings:  Abd bloating , bowel sounds present and lower abdomen is firm from fibroid enlargement , and tender with guarding. Vaginal Bleeding: minimal The percutaneous drain to right pelvis was place by trans gluteal approach.  Assessment/Plan: 1. Acute blood loss anemia.due to submucosal fibroids. - continue megace for menstrual control. May increase up to 40 tid if bleeding recurs,and would send pt home on dose that works til she can be seen by Dr Nelva Bush, her preferred Gynecologist at [facility], [phone] x [phone]. I have spokenWith Dr Earleen Newport to try to coordinate follow-up care.. Her office will call to Amy Lowe when she is here in the hospital to coordinate a follow-up visit within a week of her discharge. The patient will be given Depo-Provera 150 mg IM prior to discharge to further suppress the endometrium. Dr. Earleen Newport is considering  referring the patient to GYN oncology for surgery, due to concern ureteral obstruction is related to the uterine enlargement.  While the patient remains here at the hospital would continue broad-spectrum antibiotics until response can be converted to oral agents for outpatient care such as Augmentin and Flagyl. We'll follow along with you and may be reached at [phone] 2. UTI and ureteral stent placement as per urology's guidance 3. Pyosalpinx: Now afebrile 24 hours continue current regimen until afebrile 48 hours and consider switching to oral agents for outpatient care  LOS: 3 days    Amy Lowe 09/05/2015, 11:42 AM

## 2015-09-05 NOTE — Transfer of Care (Signed)
Immediate Anesthesia Transfer of Care Note  Patient: Amy Lowe  Procedure(s) Performed: Procedure(s): CYSTO WITH RIGHT RETROGRADE AND STENT PLACEMENT (Right)  Patient Location: PACU  Anesthesia Type:General  Level of Consciousness: awake and oriented  Airway & Oxygen Therapy: Patient Spontanous Breathing and Patient connected to face mask oxygen  Post-op Assessment: Report given to RN  Post vital signs: Reviewed and stable  Last Vitals: 152/80, 86, 22, 98% Filed Vitals:   09/05/15 0456 09/05/15 1029  BP: 129/64 139/67  Pulse: 83 85  Temp: 37.1 C 37.1 C  Resp: 18 16    Last Pain:  Filed Vitals:   09/05/15 1029  PainSc: 6       Patients Stated Pain Goal: 3 ([id number])  Complications: No apparent anesthesia complications

## 2015-09-05 NOTE — Progress Notes (Signed)
PROGRESS NOTE                                                                                                                                                                                                             Patient Demographics:    Amy Lowe, is a 40 y.o. female, DOB - Jul 07, 1975, MO:[id number]  Admit date - 09/01/2015   Admitting Physician [name], MD  Outpatient Primary MD for the patient is PROVIDER NOT [place]  LOS - 3  Chief Complaint  Patient presents with  . Flank Pain  . Abdominal Pain       Brief Narrative     Amy Lowe is a 40 y.o. female with medical history significant of with chronic and deficiency anemia previously received IV iron therapy, fibroid uterus, on diabetes mellitus, hypertension presents to the ER because of increasing pain in the abdomen on the left side over the last 2-3 days. The pain was radiating to her left groin. Had some nausea denies any vomiting or diarrhea. Denies any fever chills chest pain or shortness of breath. In the ER patient's hemoglobin was found to be around 3.8 and UA shows patient is consistent with UTI. CT abdomen and pelvis done shows bilateral hydrosalpinx with right-sided hydronephrosis and hydroureter.    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 Long-standing history of fibroid uterus with menorrhagia. Patient follows with GYN physician Dr. Katherine Mantle, she has been advised to undergo hysterectomy multiple times in the past.  She has been seen by both GYN and urology here, no urological procedures, she refuses hysterectomy again, OB consulted IR for left-sided hydrosalpinx  and drainage which was done on 09/02/2015 with 200 mL of pus drained, cultures pending, she has been placed by GYN on clindamycin and gentamicin for possible PID. Also Diflucan added for vagina candidiasis, Will monitor cultures and clinical response. Continue supportive care with IV fluids and pain control.   Discussed with Dr. Elly Modena OB/GYN on 09/04/2015, once she is afebrile place on PO Doxy-Flagyl for a total of 14  days. Followed by outpatient hysterectomy 6-8 weeks later.  Discussed with Dr. Roni Bread urologist, he shouldn't is due for Cystoscopy with right retrograde and stent placement later today.   2. Severe microcytic iron deficiency anemia. Due to menorrhagia, anemia panel pending, she is now status 3 units PRBC transfusion, initially refusing further transfusions but now agreeable we'll give her another unit on 09/04/2015. We'll place her on Iron once infections aren't better controlled.  3. Severe menorrhagia. She is currently on Megace to Tranexamic acid per GYN, GYN on case.  4. UTI. For now clindamycin and gentamicin. Follow cultures. No signs and symptoms of pyelonephritis at this time. Urology following him up planning for right-sided ureteric stent on 09/04/2015.  5. DM type II. Currently on Lantus and sliding scale will monitor.  No results found for: HGBA1C CBG (last 3)   Recent Labs  09/04/15 1643 09/04/15 2153 09/05/15 0737  GLUCAP 147* 72 140*     Code Status :  Full  Family Communication  : None present  Disposition Plan  :  Stay in the hospital likely discharge in the morning  Consults  : OB, urology, IR  Procedures  :    CT abdomen and pelvis showing R.hydronephrosis and hydrosalpinx.  CT-guided left hydrosalpinx drainage by IR with 200 mL of pus removed.  Cystoscopy with right retrograde and stent placement Scheduled by Dr. Shirlyn Goltz on 09/05/2015.  DVT Prophylaxis  :    SCDs   Lab Results  Component Value Date   PLT 385 09/05/2015     Inpatient Medications  Scheduled Meds: . bisacodyl  10 mg Rectal Daily  . calcium carbonate  1 tablet Oral BID WC  . clindamycin (CLEOCIN) IV  900 mg Intravenous Q8H  . dicyclomine  10 mg Oral TID AC  . gentamicin  7 mg/kg (Adjusted) Intravenous Q36H  . insulin aspart  0-9 Units Subcutaneous TID WC  . insulin aspart  8 Units Subcutaneous TID WC  . insulin detemir  18 Units Subcutaneous BID  . lisinopril  10 mg Oral Daily  . megestrol  20 mg Oral BID  . nystatin-triamcinolone   Topical BID  . polyethylene glycol  17 g Oral BID  . tranexamic acid  650 mg Oral TID   Continuous Infusions:   PRN Meds:.acetaminophen **OR** [DISCONTINUED] acetaminophen, alum & mag hydroxide-simeth, calcium carbonate, diphenhydrAMINE, HYDROcodone-acetaminophen, HYDROmorphone (DILAUDID) injection, [DISCONTINUED] ondansetron **OR** ondansetron (ZOFRAN) IV  Antibiotics  :    Anti-infectives    Start     Dose/Rate Route Frequency Ordered Stop   09/04/15 0000  gentamicin (GARAMYCIN) 480 mg in dextrose 5 % 100 mL IVPB     7 mg/kg  68.9 kg (Adjusted) 112 mL/hr over 60 Minutes Intravenous Every 36 hours 09/02/15 2212     09/03/15 1000  fluconazole (DIFLUCAN) tablet 150 mg     150 mg Oral  Once 09/03/15 0930 09/03/15 1020   09/02/15 1200  gentamicin (GARAMYCIN) 480 mg in dextrose 5 % 100 mL IVPB  Status:  Discontinued     7 mg/kg  68.9 kg (Adjusted) 112 mL/hr over 60 Minutes Intravenous Every 24 hours 09/02/15 1024 09/02/15 2211   09/02/15 1130  clindamycin (CLEOCIN) IVPB 900 mg     900 mg 100 mL/hr over 30 Minutes Intravenous Every 8 hours 09/02/15 1001     09/02/15 0045  levofloxacin (LEVAQUIN) IVPB 500 mg     500 mg 100 mL/hr over 60 Minutes Intravenous  Once 09/02/15 0039 09/02/15 0336  Objective:   Filed Vitals:   09/04/15 1402 09/04/15 2157 09/05/15 0456 09/05/15 0653  BP: 148/50 148/67 129/64   Pulse: 81 90 83   Temp: 98.1 F (36.7 C) 99.2 F (37.3 C) 98.7 F (37.1 C)    TempSrc: Oral Oral Oral   Resp: 16 16 18    Height:      Weight:    91.627 kg (202 lb)  SpO2: 100% 100% 99%     Wt Readings from Last 3 Encounters:  09/05/15 91.627 kg (202 lb)     Intake/Output Summary (Last 24 hours) at 09/05/15 1024 Last data filed at 09/05/15 0735  Gross per 24 hour  Intake 1589.84 ml  Output    275 ml  Net 1314.84 ml     Physical Exam  Awake Alert, Oriented X 3, No new F.N deficits, Normal affect [place].AT,PERRAL Supple Neck,No JVD, No cervical lymphadenopathy appriciated.  Symmetrical Chest wall movement, Good air movement bilaterally, CTAB RRR,No Gallops,Rubs or new Murmurs, No Parasternal Heave +ve B.Sounds, Abd Soft, mild lower abd tenderness, No organomegaly appriciated, No rebound - guarding or rigidity. No Cyanosis, Clubbing or edema, No new Rash or bruise      Data Review:    CBC  Recent Labs Lab 09/01/15 2321 09/02/15 0933 09/03/15 0443 09/03/15 1849 09/04/15 0458 09/04/15 1626 09/05/15 0522  WBC 11.9* 13.5* 17.7*  --  19.1*  --  18.4*  HGB 3.8* 6.0* 6.2* 7.2* 6.8* 8.8* 8.3*  HCT 14.6* 21.2* 21.8* 24.5* 22.9* 29.0* 27.1*  PLT 272 280 292  --  326  --  385  MCV 53.5* 59.7* 59.9*  --  63.1*  --  64.2*  MCH 13.9* 16.9* 17.0*  --  18.7*  --  19.7*  MCHC 26.0* 28.3* 28.4*  --  29.7*  --  30.6  RDW 21.5* 28.7* 29.6*  --  32.6*  --  NOT CALCULATED  LYMPHSABS 1.6  --   --   --   --   --   --   MONOABS 1.6*  --   --   --   --   --   --   EOSABS 0.0  --   --   --   --   --   --   BASOSABS 0.0  --   --   --   --   --   --     Chemistries   Recent Labs Lab 09/01/15 2321 09/02/15 0933 09/03/15 0443 09/05/15 0522  NA 132* 133* 134* 132*  K 3.3* 3.7 4.3 4.0  CL 98* 97* 100* 96*  CO2 25 25 26 27   GLUCOSE 184* 171* 89 156*  BUN 11 10 7 12   CREATININE 1.25* 1.11* 1.07* 1.09*  CALCIUM 8.4* 8.7* 8.8* 8.7*  MG  --   --  1.8 1.9    ------------------------------------------------------------------------------------------------------------------ No results for input(s): CHOL, HDL, LDLCALC, TRIG, CHOLHDL, LDLDIRECT in the last 72 hours.  No results found for: HGBA1C ------------------------------------------------------------------------------------------------------------------ No results for input(s): TSH, T4TOTAL, T3FREE, THYROIDAB in the last 72 hours.  Invalid input(s): FREET3 ------------------------------------------------------------------------------------------------------------------ No results for input(s): VITAMINB12, FOLATE, FERRITIN, TIBC, IRON, RETICCTPCT in the last 72 hours.  Coagulation profile  Recent Labs Lab 09/02/15 1050  INR 1.38    No results for input(s): DDIMER in the last 72 hours.  Cardiac Enzymes No results for input(s): CKMB, TROPONINI, MYOGLOBIN in the last 168 hours.  Invalid input(s): CK ------------------------------------------------------------------------------------------------------------------ No results found for: BNP  Micro Results Recent Results (from  the past 240 hour(s))  Urine C&S     Status: Abnormal   Collection Time: 09/01/15 10:32 PM  Result Value Ref Range Status   Specimen Description URINE, CLEAN CATCH  Final   Special Requests NONE  Final   Culture MULTIPLE SPECIES PRESENT, SUGGEST RECOLLECTION (A)  Final   Report Status 09/03/2015 FINAL  Final  Aerobic / Anaerobic Culture     Status: None (Preliminary result)   Collection Time: 09/02/15  6:25 PM  Result Value Ref Range Status   Specimen Description ABSCESS LEFT FALLOPIAN TUBE  Final   Special Requests Normal  Final   Gram Stain   Final    ABUNDANT WBC PRESENT,BOTH PMN AND MONONUCLEAR ABUNDANT GRAM POSITIVE COCCOBACILLUS    Culture   Final    NO GROWTH 2 DAYS Performed at [facility]    Report Status PENDING  Incomplete  Culture, blood (Routine X 2) w Reflex to ID Panel      Status: None (Preliminary result)   Collection Time: 09/03/15  6:54 PM  Result Value Ref Range Status   Specimen Description BLOOD RIGHT HAND  Final   Special Requests BOTTLES DRAWN AEROBIC AND ANAEROBIC 5CC  Final   Culture   Final    NO GROWTH < 24 HOURS Performed at [facility]    Report Status PENDING  Incomplete  Culture, blood (Routine X 2) w Reflex to ID Panel     Status: None (Preliminary result)   Collection Time: 09/03/15  6:54 PM  Result Value Ref Range Status   Specimen Description BLOOD RIGHT ANTECUBITAL  Final   Special Requests BOTTLES DRAWN AEROBIC AND ANAEROBIC 4CC  Final   Culture   Final    NO GROWTH < 24 HOURS Performed at [facility]    Report Status PENDING  Incomplete    Radiology Reports Nm Renal Imaging Flow W/pharm  09/03/2015  CLINICAL DATA:  RIGHT hydronephrosis EXAM: NUCLEAR MEDICINE RENAL SCAN WITH DIURETIC ADMINISTRATION TECHNIQUE: Radionuclide angiographic and sequential renal images were obtained after intravenous injection of radiopharmaceutical. Imaging was continued during slow intravenous injection of Lasix approximately 15 minutes after the start of the examination. RADIOPHARMACEUTICALS:  15.6 mCi Technetium-56m MAG3 IV COMPARISON:  CT 09/02/2015 FINDINGS: Flow: Delayed arterial flow to the RIGHT kidney compared to the LEFT. Left renogram: Uniform uptake of counts in the renal cortex. Counts are promptly excreted into the collecting system and cleared prior to administration of Lasix. Lasix augment clearance. No postvoid residual. Right renogram: Delayed cortical uptake in the RIGHT kidney. RIGHT renal cortex is thinned and hydronephrosis is present. There is minimal excretion into the RIGHT renal collecting system. The RIGHT ureter is not visualized directly. Postvoid renal cortical activity and renal pelvis residual. Differential: Left kidney = 77 % Right kidney = 23 % T1/2 post Lasix : Left kidney = 3.7 minutes (counts clear  significantly prior to administration of Lasix) Right kidney = no T 1/2 post lasix reached IMPRESSION: 1. Severe obstructive hydronephrosis of the RIGHT kidney. 2. Asymmetric renal differential as above. Electronically Signed   By: Suzy Bouchard M.D.   On: 09/03/2015 14:43   US Transvaginal Non-ob  09/03/2015  CLINICAL DATA:  Abdominal pain EXAM: TRANSABDOMINAL AND TRANSVAGINAL ULTRASOUND OF PELVIS TECHNIQUE: Study was performed transabdominally to optimize pelvic field of view evaluation and transvaginally to optimize internal visceral architecture evaluation. COMPARISON:  CT abdomen and pelvis Sep 02, 2015 FINDINGS: Uterus Measurements: 12.3 x 7.7 x 8.3 cm. The echogenicity of the  uterus is diffusely inhomogeneous consistent with leiomyomatous change. There is a dominant mass in the midportion of the uterus anteriorly measuring 6.6 x 5.4 x 6.2 cm. Along the superior aspect of the uterus, there is a 3.0 x 3.7 x 3.4 cm mass. These lesions are felt to be consistent with leiomyomas. Endometrium Thickness: 14 mm.  Dominant uterine mass impresses upon endometrium. Right ovary Ovarian appearing tissue is not evident by transabdominal or transvaginal technique. There is a complex tubular appearing lesion in the right adnexal area, concerning for potential hydrosalpinx. Left ovary Ovarian appearing tissue is not evident by transabdominal or transvaginal technique. There is a complex cystic area measuring 13.2 x 6.7 x 5.5 cm 5.5 cm in the left adnexal region with tubular appearing cystic fluid noted in this area as well. Other findings Minimal free fluid noted. IMPRESSION: Complex cystic structure left adnexa. Suspect bilateral hydrosalpinx. Prominent uterus with leiomyomatous change. Dominant leiomyoma measuring 6.6 x 5.4 x 6.2 cm. The current sonographic findings are commensurate with the CT findings of 1 day prior. Given the difficulty in discerning discrete extrauterine structures, correlation with pelvic MR may  be reasonable in this circumstance. Electronically Signed   By: Lowella Grip III M.D.   On: 09/03/2015 16:27   US Pelvis Complete  09/03/2015  CLINICAL DATA:  Abdominal pain EXAM: TRANSABDOMINAL AND TRANSVAGINAL ULTRASOUND OF PELVIS TECHNIQUE: Study was performed transabdominally to optimize pelvic field of view evaluation and transvaginally to optimize internal visceral architecture evaluation. COMPARISON:  CT abdomen and pelvis Sep 02, 2015 FINDINGS: Uterus Measurements: 12.3 x 7.7 x 8.3 cm. The echogenicity of the uterus is diffusely inhomogeneous consistent with leiomyomatous change. There is a dominant mass in the midportion of the uterus anteriorly measuring 6.6 x 5.4 x 6.2 cm. Along the superior aspect of the uterus, there is a 3.0 x 3.7 x 3.4 cm mass. These lesions are felt to be consistent with leiomyomas. Endometrium Thickness: 14 mm.  Dominant uterine mass impresses upon endometrium. Right ovary Ovarian appearing tissue is not evident by transabdominal or transvaginal technique. There is a complex tubular appearing lesion in the right adnexal area, concerning for potential hydrosalpinx. Left ovary Ovarian appearing tissue is not evident by transabdominal or transvaginal technique. There is a complex cystic area measuring 13.2 x 6.7 x 5.5 cm 5.5 cm in the left adnexal region with tubular appearing cystic fluid noted in this area as well. Other findings Minimal free fluid noted. IMPRESSION: Complex cystic structure left adnexa. Suspect bilateral hydrosalpinx. Prominent uterus with leiomyomatous change. Dominant leiomyoma measuring 6.6 x 5.4 x 6.2 cm. The current sonographic findings are commensurate with the CT findings of 1 day prior. Given the difficulty in discerning discrete extrauterine structures, correlation with pelvic MR may be reasonable in this circumstance. Electronically Signed   By: Lowella Grip III M.D.   On: 09/03/2015 16:27   Ct Renal Stone Study  09/02/2015  CLINICAL DATA:   Left flank and abdominal pain, acute onset. Right blood cells and Cater blood cells in the urine. Initial encounter. EXAM: CT ABDOMEN AND PELVIS WITHOUT CONTRAST TECHNIQUE: Multidetector CT imaging of the abdomen and pelvis was performed following the standard protocol without IV contrast. COMPARISON:  Pelvic ultrasound performed 01/21/2015 FINDINGS: The visualized lung bases are clear. The liver is unremarkable in appearance. The spleen is mildly enlarged, measuring 13.7 cm in length. The gallbladder is within normal limits. The pancreas and adrenal glands are unremarkable. There is relatively severe chronic right-sided hydronephrosis, with diffuse dilatation of the  right ureter. No distal obstructing stone is seen. This may reflect mass effect from the patient's hydrosalpinges. The left kidney is grossly unremarkable in appearance. Nonspecific perinephric stranding is noted bilaterally. No nonobstructing renal stones are identified. Prominent retroperitoneal nodes measure up to 1.2 cm in short axis. These are difficult to fully assess without contrast. No free fluid is identified. The small bowel is unremarkable in appearance. The stomach is within normal limits. No acute vascular abnormalities are seen. Minimal vascular calcifications are seen. The appendix is normal in caliber, without evidence of appendicitis. The colon is grossly unremarkable in appearance. The bladder is mildly distended and grossly unremarkable. An apparent 5 cm fibroid is noted within the uterus. A likely exophytic fibroid is seen on the right. Prominent bilateral tubular cystic structures are noted at the pelvis, which may reflect recurrent bilateral hydrosalpinges, left larger than right. The ovaries are not well assessed. No inguinal lymphadenopathy is seen. No acute osseous abnormalities are identified. IMPRESSION: 1. Prominent retroperitoneal nodes measure up to 1.2 cm in short axis. These are difficult to fully assess without  contrast. Malignancy cannot be entirely excluded. Would correlate with the patient's history, and consider contrast-enhanced CT or PET/CT when and as deemed clinically appropriate. 2. Relatively severe chronic right-sided hydronephrosis, with diffuse dilatation of the right ureter. This may reflect mild mass-effect from the patient's apparent hydrosalpinges. No obstructing stone seen. 3. Prominent bilateral tubular cystic structures at the pelvis, which may reflect recurrent bilateral hydrosalpinges, left larger than right. This could be further assessed on pelvic ultrasound, or on the follow-up study mentioned above. 4. Uterine fibroids again noted. 5. Mild splenomegaly. Electronically Signed   By: Garald Balding M.D.   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 She now presents with leukocytosis and severe left pelvic pain concerning for superinfection of the left hydrosalpinx. She presents to interventional radiology for percutaneous drain placement. EXAM: CT GUIDED DRAINAGE OF  ABSCESS MEDICATIONS: The patient is currently admitted to the hospital and receiving intravenous antibiotics. The antibiotics were administered within an appropriate time frame prior to the initiation of the procedure. ANESTHESIA/SEDATION: 1 mg IV Versed 50 mcg IV Fentanyl Moderate Sedation Time:  20 The patient was continuously monitored during the procedure by the interventional radiology nurse under my direct supervision. COMPLICATIONS: None immediate. Estimated blood loss: None TECHNIQUE: Informed written consent was obtained from the patient after a thorough discussion of the procedural risks, benefits and alternatives. All questions were addressed. A timeout was performed prior to the initiation of the procedure. PROCEDURE: A planning axial CT scan was performed. The large  left pelvic low-attenuation fluid collection is successfully identified. A suitable skin entry site was selected and marked. The region was then sterilely prepped and draped in standard fashion using chlorhexidine skin prep. Local anesthesia was attained by infiltration with 1% lidocaine. A small dermatotomy was made. Under intermittent CT fluoroscopic guidance, an 18 gauge trocar needle was carefully advanced along a left trans gluteal /parasacral approach and into the fluid collection. Care was taken to avoid the adjacent rectum. An Amplatz wire was coiled within the fluid collection in the skin tract dilated to 10 [place]. A Cook 10 [place] all-purpose drainage catheter was then advanced over the wire. Approximately 200 mL of purulent fluid was successfully aspirated. A sample was sent for cultures. The tube was then secured in place  with 0 Prolene suture and connected to JP bulb suction. Post drain placement axial CT imaging demonstrates a well-positioned drainage catheter with significantly reduced volume of the left hydrosalpinx. FINDINGS: Approximately 200 mL thick, purulent and foul smelling fluid in the left fallopian tube consistent with pyosalpinx. IMPRESSION: Technically successful placement of a left trans gluteal approach drainage catheter into the left-sided pyosalpinx. Aspiration yields 200 mL purulent fluid. A sample was sent for culture. Signed, Criselda Peaches, MD [facility] Radiology Electronically Signed   By: Jacqulynn Cadet M.D.   On: 09/05/2015 09:17    Time Spent in minutes  30   SINGH,PRASHANT K M.D on 09/05/2015 at 10:24 AM  Between 7am to 7pm - Pager - [phone]  After 7pm go to [email or web address] - password [facility]  [facility] -  Office  [phone]

## 2015-09-05 NOTE — Anesthesia Postprocedure Evaluation (Signed)
Anesthesia Post Note  Patient: Amy Lowe  Procedure(s) Performed: Procedure(s) (LRB): CYSTO WITH RIGHT RETROGRADE AND STENT PLACEMENT (Right)  Patient location during evaluation: PACU Anesthesia Type: General Level of consciousness: awake and alert Pain management: pain level controlled Vital Signs Assessment: post-procedure vital signs reviewed and stable Respiratory status: spontaneous breathing, nonlabored ventilation, respiratory function stable and patient connected to nasal cannula oxygen Cardiovascular status: blood pressure returned to baseline and stable Postop Assessment: no signs of nausea or vomiting Anesthetic complications: no    Last Vitals:  Filed Vitals:   09/05/15 1345 09/05/15 1400  BP: 138/70 127/73  Pulse: 81 81  Temp:    Resp: 19 19    Last Pain:  Filed Vitals:   09/05/15 1405  PainSc: 0-No pain                 [name] A

## 2015-09-05 NOTE — Progress Notes (Signed)
Patient ID: Amy Lowe, female   DOB: 12/05/1975, 40 y.o.   MRN: [id number]    Subjective: Amy Lowe has no right flank pain and remains afebrile but continues to have pelvic pain.   She is for stent placement today.  ROS:  ROS  Anti-infectives: Anti-infectives    Start     Dose/Rate Route Frequency Ordered Stop   09/04/15 0000  gentamicin (GARAMYCIN) 480 mg in dextrose 5 % 100 mL IVPB     7 mg/kg  68.9 kg (Adjusted) 112 mL/hr over 60 Minutes Intravenous Every 36 hours 09/02/15 2212     09/03/15 1000  fluconazole (DIFLUCAN) tablet 150 mg     150 mg Oral  Once 09/03/15 0930 09/03/15 1020   09/02/15 1200  gentamicin (GARAMYCIN) 480 mg in dextrose 5 % 100 mL IVPB  Status:  Discontinued     7 mg/kg  68.9 kg (Adjusted) 112 mL/hr over 60 Minutes Intravenous Every 24 hours 09/02/15 1024 09/02/15 2211   09/02/15 1130  clindamycin (CLEOCIN) IVPB 900 mg     900 mg 100 mL/hr over 30 Minutes Intravenous Every 8 hours 09/02/15 1001     09/02/15 0045  levofloxacin (LEVAQUIN) IVPB 500 mg     500 mg 100 mL/hr over 60 Minutes Intravenous  Once 09/02/15 0039 09/02/15 0336      Current Facility-Administered Medications  Medication Dose Route Frequency Provider Last Rate Last Dose  . 0.9 %  sodium chloride infusion   Intravenous Continuous [name], MD 50 mL/hr at 09/04/15 1205    . acetaminophen (TYLENOL) tablet 650 mg  650 mg Oral Q6H PRN [name], MD   650 mg at 09/03/15 1727  . alum & mag hydroxide-simeth (MAALOX/MYLANTA) 200-200-20 MG/5ML suspension 30 mL  30 mL Oral Q6H PRN Rhetta Mura Schorr, NP   30 mL at 09/04/15 2018  . bisacodyl (DULCOLAX) suppository 10 mg  10 mg Rectal Daily [name], MD   10 mg at 09/03/15 1347  . calcium carbonate (TUMS - dosed in mg elemental calcium) chewable tablet 200 mg of elemental calcium  1 tablet Oral BID WC [name], MD   200 mg of elemental calcium at 09/04/15 1804  . calcium carbonate (TUMS - dosed in mg elemental calcium)  chewable tablet 400 mg of elemental calcium  400 mg of elemental calcium Oral TID PRN Rhetta Mura Schorr, NP   400 mg of elemental calcium at 09/04/15 1052  . clindamycin (CLEOCIN) IVPB 900 mg  900 mg Intravenous Q8H Donnamae Jude, MD   900 mg at 09/05/15 0506  . dicyclomine (BENTYL) capsule 10 mg  10 mg Oral TID AC Tanna Savoy Stinson, DO   10 mg at 09/04/15 1804  . diphenhydrAMINE (BENADRYL) injection 25 mg  25 mg Intravenous Q6H PRN [name], MD      . gentamicin (GARAMYCIN) 480 mg in dextrose 5 % 100 mL IVPB  7 mg/kg (Adjusted) Intravenous Q36H [name], MD   480 mg at 09/04/15 0034  . HYDROcodone-acetaminophen (NORCO/VICODIN) 5-325 MG per tablet 1 tablet  1 tablet Oral Q4H PRN [name], MD   1 tablet at 09/04/15 2226  . HYDROmorphone (DILAUDID) injection 1-2 mg  1-2 mg Intravenous Q3H PRN Donnamae Jude, MD   2 mg at 09/05/15 0506  . insulin aspart (novoLOG) injection 0-9 Units  0-9 Units Subcutaneous TID WC [name], MD   1 Units at 09/04/15 1805  . insulin aspart (  novoLOG) injection 8 Units  8 Units Subcutaneous TID WC [name], MD   8 Units at 09/04/15 1805  . insulin detemir (LEVEMIR) injection 18 Units  18 Units Subcutaneous BID [name], MD   18 Units at 09/04/15 [phone]  . lisinopril (PRINIVIL,ZESTRIL) tablet 10 mg  10 mg Oral Daily [name], MD   10 mg at 09/04/15 0851  . megestrol (MEGACE) tablet 20 mg  20 mg Oral BID Mora Bellman, MD   20 mg at 09/04/15 2225  . nystatin-triamcinolone (MYCOLOG II) cream   Topical BID Peggy Constant, MD      . ondansetron (ZOFRAN) injection 4 mg  4 mg Intravenous Q6H PRN [name], MD   4 mg at 09/02/15 0353  . polyethylene glycol (MIRALAX / GLYCOLAX) packet 17 g  17 g Oral BID [name], MD   17 g at 09/04/15 2225  . tranexamic acid (LYSTEDA) tablet 650 mg  650 mg Oral TID [name], MD   650 mg at 09/04/15 2225     Objective: Vital signs in last 24 hours: Temp:   [98.1 F (36.7 C)-99.2 F (37.3 C)] 98.7 F (37.1 C) (06/01 0456) Pulse Rate:  [81-90] 83 (06/01 0456) Resp:  [16-18] 18 (06/01 0456) BP: (129-148)/(50-67) 129/64 mmHg (06/01 0456) SpO2:  [99 %-100 %] 99 % (06/01 0456) Weight:  [91.627 kg (202 lb)] 91.627 kg (202 lb) (06/01 0653)  Intake/Output from previous day: 05/31 0701 - 06/01 0700 In: 1589.8 [P.O.:360; I.V.:945.8; Blood:279] Out: 0  Intake/Output this shift: Total I/O In: -  Out: 275 [Urine:275]   Physical Exam  Lab Results:   Recent Labs  09/04/15 0458 09/04/15 1626 09/05/15 0522  WBC 19.1*  --  18.4*  HGB 6.8* 8.8* 8.3*  HCT 22.9* 29.0* 27.1*  PLT 326  --  385   BMET  Recent Labs  09/03/15 0443 09/05/15 0522  NA 134* 132*  K 4.3 4.0  CL 100* 96*  CO2 26 27  GLUCOSE 89 156*  BUN 7 12  CREATININE 1.07* 1.09*  CALCIUM 8.8* 8.7*   PT/INR  Recent Labs  09/02/15 1050  LABPROT 17.0*  INR 1.38   ABG No results for input(s): PHART, HCO3 in the last 72 hours.  Invalid input(s): PCO2, PO2  Studies/Results: Nm Renal Imaging Flow W/pharm  09/03/2015  CLINICAL DATA:  RIGHT hydronephrosis EXAM: NUCLEAR MEDICINE RENAL SCAN WITH DIURETIC ADMINISTRATION TECHNIQUE: Radionuclide angiographic and sequential renal images were obtained after intravenous injection of radiopharmaceutical. Imaging was continued during slow intravenous injection of Lasix approximately 15 minutes after the start of the examination. RADIOPHARMACEUTICALS:  15.6 mCi Technetium-53m MAG3 IV COMPARISON:  CT 09/02/2015 FINDINGS: Flow: Delayed arterial flow to the RIGHT kidney compared to the LEFT. Left renogram: Uniform uptake of counts in the renal cortex. Counts are promptly excreted into the collecting system and cleared prior to administration of Lasix. Lasix augment clearance. No postvoid residual. Right renogram: Delayed cortical uptake in the RIGHT kidney. RIGHT renal cortex is thinned and hydronephrosis is present. There is minimal  excretion into the RIGHT renal collecting system. The RIGHT ureter is not visualized directly. Postvoid renal cortical activity and renal pelvis residual. Differential: Left kidney = 77 % Right kidney = 23 % T1/2 post Lasix : Left kidney = 3.7 minutes (counts clear significantly prior to administration of Lasix) Right kidney = no T 1/2 post lasix reached IMPRESSION: 1. Severe obstructive hydronephrosis of the RIGHT kidney. 2. Asymmetric renal differential  as above. Electronically Signed   By: Suzy Bouchard M.D.   On: 09/03/2015 14:43   US Transvaginal Non-ob  09/03/2015  CLINICAL DATA:  Abdominal pain EXAM: TRANSABDOMINAL AND TRANSVAGINAL ULTRASOUND OF PELVIS TECHNIQUE: Study was performed transabdominally to optimize pelvic field of view evaluation and transvaginally to optimize internal visceral architecture evaluation. COMPARISON:  CT abdomen and pelvis Sep 02, 2015 FINDINGS: Uterus Measurements: 12.3 x 7.7 x 8.3 cm. The echogenicity of the uterus is diffusely inhomogeneous consistent with leiomyomatous change. There is a dominant mass in the midportion of the uterus anteriorly measuring 6.6 x 5.4 x 6.2 cm. Along the superior aspect of the uterus, there is a 3.0 x 3.7 x 3.4 cm mass. These lesions are felt to be consistent with leiomyomas. Endometrium Thickness: 14 mm.  Dominant uterine mass impresses upon endometrium. Right ovary Ovarian appearing tissue is not evident by transabdominal or transvaginal technique. There is a complex tubular appearing lesion in the right adnexal area, concerning for potential hydrosalpinx. Left ovary Ovarian appearing tissue is not evident by transabdominal or transvaginal technique. There is a complex cystic area measuring 13.2 x 6.7 x 5.5 cm 5.5 cm in the left adnexal region with tubular appearing cystic fluid noted in this area as well. Other findings Minimal free fluid noted. IMPRESSION: Complex cystic structure left adnexa. Suspect bilateral hydrosalpinx. Prominent  uterus with leiomyomatous change. Dominant leiomyoma measuring 6.6 x 5.4 x 6.2 cm. The current sonographic findings are commensurate with the CT findings of 1 day prior. Given the difficulty in discerning discrete extrauterine structures, correlation with pelvic MR may be reasonable in this circumstance. Electronically Signed   By: Lowella Grip III M.D.   On: 09/03/2015 16:27   US Pelvis Complete  09/03/2015  CLINICAL DATA:  Abdominal pain EXAM: TRANSABDOMINAL AND TRANSVAGINAL ULTRASOUND OF PELVIS TECHNIQUE: Study was performed transabdominally to optimize pelvic field of view evaluation and transvaginally to optimize internal visceral architecture evaluation. COMPARISON:  CT abdomen and pelvis Sep 02, 2015 FINDINGS: Uterus Measurements: 12.3 x 7.7 x 8.3 cm. The echogenicity of the uterus is diffusely inhomogeneous consistent with leiomyomatous change. There is a dominant mass in the midportion of the uterus anteriorly measuring 6.6 x 5.4 x 6.2 cm. Along the superior aspect of the uterus, there is a 3.0 x 3.7 x 3.4 cm mass. These lesions are felt to be consistent with leiomyomas. Endometrium Thickness: 14 mm.  Dominant uterine mass impresses upon endometrium. Right ovary Ovarian appearing tissue is not evident by transabdominal or transvaginal technique. There is a complex tubular appearing lesion in the right adnexal area, concerning for potential hydrosalpinx. Left ovary Ovarian appearing tissue is not evident by transabdominal or transvaginal technique. There is a complex cystic area measuring 13.2 x 6.7 x 5.5 cm 5.5 cm in the left adnexal region with tubular appearing cystic fluid noted in this area as well. Other findings Minimal free fluid noted. IMPRESSION: Complex cystic structure left adnexa. Suspect bilateral hydrosalpinx. Prominent uterus with leiomyomatous change. Dominant leiomyoma measuring 6.6 x 5.4 x 6.2 cm. The current sonographic findings are commensurate with the CT findings of 1 day  prior. Given the difficulty in discerning discrete extrauterine structures, correlation with pelvic MR may be reasonable in this circumstance. Electronically Signed   By: Lowella Grip III M.D.   On: 09/03/2015 16:27     Assessment and Plan: Right ureteral obstruction.  Cystoscopy with right retrograde and stent placement later today.         LOS: 3 days  Nellie Pester J 09/05/2015 [phone]

## 2015-09-05 NOTE — Brief Op Note (Signed)
09/01/2015 - 09/05/2015  1:11 PM  PATIENT:  Franchot Heidelberg Bussie  40 y.o. female  PRE-OPERATIVE DIAGNOSIS:  RIGHT URETERAL OBSTRUCTION  POST-OPERATIVE DIAGNOSIS:  RIGHT URETERAL OBSTRUCTION  PROCEDURE:  Procedure(s): CYSTO WITH RIGHT RETROGRADE AND STENT PLACEMENT (Right)  SURGEON:  Surgeon(s) and Role:    * Irine Seal, MD - Primary  PHYSICIAN ASSISTANT:   ASSISTANTS: none   ANESTHESIA:   general  EBL:  Total I/O In: -  Out: 275 [Urine:275]  BLOOD ADMINISTERED:none  DRAINS: 6 x 26 right JJ stent   LOCAL MEDICATIONS USED:  NONE  SPECIMEN:  No Specimen  DISPOSITION OF SPECIMEN:  N/A  COUNTS:  YES  TOURNIQUET:  * No tourniquets in log *  DICTATION: .Other Dictation: Dictation Number [id number]  PLAN OF CARE: Admit to inpatient   PATIENT DISPOSITION:  PACU - hemodynamically stable.   Delay start of Pharmacological VTE agent (>24hrs) due to surgical blood loss or risk of bleeding: not applicable

## 2015-09-06 ENCOUNTER — Encounter (HOSPITAL_COMMUNITY): Payer: Self-pay | Admitting: Urology

## 2015-09-06 LAB — GLUCOSE, CAPILLARY
GLUCOSE-CAPILLARY: 198 mg/dL — AB (ref 65–99)
GLUCOSE-CAPILLARY: 116 mg/dL — AB (ref 65–99)
GLUCOSE-CAPILLARY: 153 mg/dL — AB (ref 65–99)
GLUCOSE-CAPILLARY: 104 mg/dL — AB (ref 65–99)
GLUCOSE-CAPILLARY: 113 mg/dL — AB (ref 65–99)

## 2015-09-06 LAB — TYPE AND SCREEN
ABO/RH(D): O POS
Antibody Screen: NEGATIVE
UNIT DIVISION: 0
Unit division: 0
Unit division: 0
Unit division: 0
Unit division: 0

## 2015-09-06 LAB — BASIC METABOLIC PANEL
ANION GAP: 8 (ref 5–15)
BUN: 12 mg/dL (ref 6–20)
CO2: 29 mmol/L (ref 22–32)
Calcium: 8.6 mg/dL — ABNORMAL LOW (ref 8.9–10.3)
Chloride: 98 mmol/L — ABNORMAL LOW (ref 101–111)
Creatinine, Ser: 1.19 mg/dL — ABNORMAL HIGH (ref 0.44–1.00)
GFR calc Af Amer: >60 mL/min (ref >60)
GFR, EST NON AFRICAN AMERICAN: 56 mL/min — AB (ref >60)
GLUCOSE: 132 mg/dL — AB (ref 65–99)
POTASSIUM: 3.9 mmol/L (ref 3.5–5.1)
Sodium: 135 mmol/L (ref 135–145)

## 2015-09-06 LAB — CBC
HEMATOCRIT: 25.8 % — AB (ref 36.0–46.0)
HEMOGLOBIN: 7.8 g/dL — AB (ref 12.0–15.0)
MCH: 19.7 pg — AB (ref 26.0–34.0)
MCHC: 30.2 g/dL (ref 30.0–36.0)
MCV: 65.3 fL — AB (ref 78.0–100.0)
Platelets: 365 10*3/uL (ref 150–400)
RBC: 3.95 MIL/uL (ref 3.87–5.11)
WBC: 15.2 10*3/uL — ABNORMAL HIGH (ref 4.0–10.5)

## 2015-09-06 MED ORDER — HYDROCORTISONE 2.5 % RE CREA
TOPICAL_CREAM | Freq: Three times a day (TID) | RECTAL | Status: DC
  Administered 2015-09-06 (×2): via RECTAL
  Filled 2015-09-06: qty 28.35

## 2015-09-06 NOTE — Op Note (Signed)
Amy Lowe, Amy Lowe                ACCOUNT NO.:  [id number]  MEDICAL RECORD NO.:  [id number]  LOCATION:  70                         FACILITY:  [facility]  PHYSICIAN:  Marshall Cork. Jeffie Pollock, M.D.    DATE OF BIRTH:  1975/08/20  DATE OF PROCEDURE:  09/05/2015 DATE OF DISCHARGE:                              OPERATIVE REPORT   PROCEDURE:  Cystoscopy with right retrograde pyelogram and interpretation.  Insertion of right double-J stent.  PREOPERATIVE DIAGNOSIS:  Right ureteral obstruction.  POSTOPERATIVE DIAGNOSIS:  Right ureteral obstruction at level of the intramural ureter.  SURGEON:  Marshall Cork. Jeffie Pollock, MD  ANESTHESIA:  General.  SPECIMEN:  None.  DRAINS:  A 6-French x 26 cm Contour double-J stent.  BLOOD LOSS:  None.  COMPLICATIONS:  None.  INDICATIONS:  Ms. Kata is a 40 year old, African American female who was admitted with pelvic pain.  She was found to have right hydronephrosis on a CT scan done for the pelvic pain but she was also noted to have significant bilateral hydrosalpinx and uterine fibroids and her pain was felt to be coming from infected hydrosalpinx which has subsequently been drained.  She had a renogram which showed only 23% function on the right with high-grade obstruction and it was felt that stenting was indicated with hysterectomy to be performed in the future.  FINDINGS OF PROCEDURE:  She was taken to the operating room where general anesthetic was induced.  She had been on gent and cleo.  She was fitted with PAS hose and placed in lithotomy position.  Her perineum and genitalia were prepped with Betadine solution.  She was draped in usual sterile fashion.  Cystoscopy was performed using the 23-French scope and 30-degree lens. Examination revealed a normal urethra.  The bladder neck was somewhat elevated, but no mucosal lesions were identified.  She had mild trabeculation.  Ureteral orifices were unremarkable.  The right ureteral orifice was cannulated with a  5-French open-end catheter and contrast was instilled.  This revealed a tight, but patent stricture of the distal most 2 cm of the ureter with proximal hydronephrosis.  She had marked tortuosity of the proximal ureter with dilation of collecting system.  A guidewire was passed through the strictured area without difficulty, and an opening catheter was then inserted.  The wire was removed.  There was a brisk hydronephrotic drip.  The wire was reinserted and advanced to the kidney.  There was some difficulty initially getting the open-end catheter all the way into the kidney over the wire because of the marked tortuosity in the proximal ureter.  I attempted to use an Amplatz Stiff wire but that did not really improve the situation eventually with additional negotiation and the use of a larger 6-French ureteral catheter, I was able to get the wire comfortably in the renal collecting system.  At this point, the 6-French catheter was removed and a 6-French 26 cm Contour double-J stent was passed over the wire to the kidney.  It was felt that the loop of the wire was either at the UPJ in the lower most portion of the renal pelvis, but it was passed to the fullest extent possible with just the loop  in the bladder.  Once the stent was in position, the wire was removed leaving a coil in the area of the UPJ and in the bladder.  The patient's bladder was drained prior to taking down from lithotomy position.  I did do a vaginal exam and really felt no significant mass effect at the level of the bladder neck.  She did have mass effect more proximally consistent with her fibroids and hydrosalpinx.  Additionally, the anal area was checked because she had reported some hemorrhoidal bleeding.  Prior to the procedure, she had some old hemorrhoidal tags, but no thrombosed hemorrhoids or internal hemorrhoids that will suggest a need for treatment.  At this point, she was taken down from lithotomy  position. Her anesthetic was reversed.  She was moved to recovery room in stable condition.  There were no complications.  I do think when she has her hysterectomy, that Urology will need to be available for consideration of re-implantation if an obvious source of the ureteral obstruction is not identified and alleviated since this was such a distal blockage.     Marshall Cork. Jeffie Pollock, M.D.     JJW/MEDQ  D:  09/05/2015  T:  09/06/2015  Job:  [id number]  cc:   Dr. Carlos Lever[facility] Teaching Service

## 2015-09-06 NOTE — Progress Notes (Signed)
PROGRESS NOTE                                                                                                                                                                                                             Patient Demographics:    Amy Lowe, is a 40 y.o. female, DOB - 1975/04/08, [id number]  Admit date - 09/01/2015   Admitting Physician [name], MD  Outpatient Primary MD for the patient is PROVIDER NOT [place]  LOS - 4  Chief Complaint  Patient presents with  . Flank Pain  . Abdominal Pain       Brief Narrative     Amy Lowe is a 40 y.o. female with medical history significant of with chronic and deficiency anemia previously received IV iron therapy, fibroid uterus, on diabetes mellitus, hypertension presents to the ER because of increasing pain in the abdomen on the left side over the last 2-3 days. The pain was radiating to her left groin. Had some nausea denies any vomiting or diarrhea. Denies any fever chills chest pain or shortness of breath. In the ER patient's hemoglobin was found to be around 3.8 and UA shows patient is consistent with UTI. CT abdomen and pelvis done shows bilateral hydrosalpinx with right-sided hydronephrosis and hydroureter.    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 Long-standing history of fibroid uterus with menorrhagia. Patient follows with GYN physician Dr. Katherine Mantle, she has been advised to undergo hysterectomy multiple times in the past.  She has been seen by both GYN and urology here, no urological procedures, she refuses hysterectomy again, OB consulted IR for left-sided hydrosalpinx  and drainage which was done on 09/02/2015 with 200 mL of pus drained, cultures pending, she has been placed by GYN on clindamycin and gentamicin for possible PID. Also Diflucan added for vagina candidiasis, Will monitor cultures and clinical response. Continue supportive care with IV fluids and pain control.   Discussed with Dr. Elly Modena OB/GYN on 09/04/2015, once she is afebrile place on PO Doxy-Flagyl for a total of 14  days. Followed by outpatient hysterectomy 6-8 weeks later. Request GYN to please clarify oral antibiotic regimen. I have received 2 different recommendations from 2 different GYN physicians.   Discussed with Dr. Roni Bread urologist, she underwent Cystoscopy with right retrograde pyelogram and interpretation. Insertion of right double-J stent on 09-06-15   2. Severe microcytic iron deficiency anemia. Due to menorrhagia, anemia panel pending, she is now status 3 units PRBC transfusion, initially refusing further transfusions but now agreeable we'll give her another unit on 09/04/2015. We'll place her on Iron once infections aren't better controlled likely upon DC.  3. Severe menorrhagia. She is currently on Megace to Tranexamic acid per GYN, GYN on case.  4. UTI. For now clindamycin and gentamicin. Follow cultures. No signs and symptoms of pyelonephritis at this time. She is post  Cystoscopy with right retrograde pyelogram and interpretation. Insertion of right double-J stent on 09-06-15  5. DM type II. Currently on Lantus and sliding scale will monitor.  No results found for: HGBA1C CBG (last 3)   Recent Labs  09/05/15 1130 09/05/15 1334 09/05/15 1613  GLUCAP 137* 165* 164*     Code Status :  Full  Family Communication  : None present  Disposition Plan  :  Stay in the hospital likely discharge in the morning  Consults  : OB, urology, IR  Procedures  :    CT abdomen and pelvis showing R.hydronephrosis and hydrosalpinx.  CT-guided left hydrosalpinx drainage by IR with  200 mL of pus removed.  Cystoscopy with right retrograde pyelogram and interpretation. Insertion of right double-J stent. by Dr. Shirlyn Goltz on 09/05/2015.  DVT Prophylaxis  :    SCDs   Lab Results  Component Value Date   PLT 365 09/06/2015    Inpatient Medications  Scheduled Meds: . bisacodyl  10 mg Rectal Daily  . calcium carbonate  1 tablet Oral BID WC  . clindamycin (CLEOCIN) IV  900 mg Intravenous Q8H  . dicyclomine  10 mg Oral TID AC  . gentamicin  7 mg/kg (Adjusted) Intravenous Q36H  . insulin aspart  0-9 Units Subcutaneous TID WC  . insulin aspart  8 Units Subcutaneous TID WC  . insulin detemir  18 Units Subcutaneous BID  . lisinopril  10 mg Oral Daily  . megestrol  20 mg Oral BID  . nystatin-triamcinolone   Topical BID  . polyethylene glycol  17 g Oral BID  . tranexamic acid  650 mg Oral TID   Continuous Infusions:   PRN Meds:.alum & mag hydroxide-simeth, calcium carbonate, diphenhydrAMINE, HYDROcodone-acetaminophen, HYDROmorphone (DILAUDID) injection  Antibiotics  :    Anti-infectives    Start     Dose/Rate Route Frequency Ordered Stop   09/04/15 0000  gentamicin (GARAMYCIN) 480 mg in dextrose 5 % 100 mL IVPB     7 mg/kg  68.9 kg (Adjusted) 112 mL/hr over 60 Minutes Intravenous Every 36 hours 09/02/15 2212     09/03/15 1000  fluconazole (DIFLUCAN) tablet 150 mg     150 mg Oral  Once 09/03/15 0930 09/03/15 1020   09/02/15 1200  gentamicin (GARAMYCIN) 480 mg in dextrose 5 % 100 mL IVPB  Status:  Discontinued     7 mg/kg  68.9 kg (Adjusted) 112 mL/hr over 60 Minutes Intravenous Every 24 hours 09/02/15 1024 09/02/15 2211   09/02/15 1130  clindamycin (CLEOCIN) IVPB 900 mg     900 mg 100 mL/hr over 30 Minutes Intravenous Every 8 hours 09/02/15 1001     09/02/15 0045  levofloxacin (LEVAQUIN) IVPB 500 mg     500 mg 100 mL/hr over 60 Minutes Intravenous  Once 09/02/15 0039 09/02/15 0336         Objective:   Filed Vitals:   09/05/15 1445 09/05/15 1500  09/05/15 2034 09/06/15 0545  BP: 132/76 132/68 130/65 122/62  Pulse: 80 81 65 86  Temp: 98.1 F (36.7 C) 98.3 F (36.8 C) 98.2 F (36.8 C) 98.1 F (36.7 C)  TempSrc:   Oral Oral  Resp: 14  18 18   Height:      Weight:      SpO2: 99% 100% 100% 100%    Wt Readings from Last 3 Encounters:  09/05/15 91.627 kg (202 lb)     Intake/Output Summary (Last 24 hours) at 09/06/15 1030 Last data filed at 09/05/15 2300  Gross per 24 hour  Intake 1070.83 ml  Output    550 ml  Net 520.83 ml     Physical Exam  Awake Alert, Oriented X 3, No new F.N deficits, Normal affect Reading.AT,PERRAL Supple Neck,No JVD, No cervical lymphadenopathy appriciated.  Symmetrical Chest wall movement, Good air movement bilaterally, CTAB RRR,No Gallops,Rubs or new Murmurs, No Parasternal Heave +ve B.Sounds, Abd Soft, mild lower abd tenderness, No organomegaly appriciated, No rebound - guarding or rigidity. No Cyanosis, Clubbing or edema, No new Rash or bruise      Data Review:    CBC  Recent Labs Lab 09/01/15 2321 09/02/15 0933 09/03/15 0443 09/03/15 1849 09/04/15 0458 09/04/15 1626 09/05/15 0522 09/06/15 0453  WBC 11.9* 13.5* 17.7*  --  19.1*  --  18.4* 15.2*  HGB 3.8* 6.0* 6.2* 7.2* 6.8* 8.8* 8.3* 7.8*  HCT 14.6* 21.2* 21.8* 24.5* 22.9* 29.0* 27.1* 25.8*  PLT 272 280 292  --  326  --  385 365  MCV 53.5* 59.7* 59.9*  --  63.1*  --  64.2* 65.3*  MCH 13.9* 16.9* 17.0*  --  18.7*  --  19.7* 19.7*  MCHC 26.0* 28.3* 28.4*  --  29.7*  --  30.6 30.2  RDW 21.5* 28.7* 29.6*  --  32.6*  --  NOT CALCULATED NOT CALCULATED  LYMPHSABS 1.6  --   --   --   --   --   --   --   MONOABS 1.6*  --   --   --   --   --   --   --   EOSABS 0.0  --   --   --   --   --   --   --   BASOSABS 0.0  --   --   --   --   --   --   --     Chemistries   Recent Labs Lab 09/01/15 2321 09/02/15 0933 09/03/15 0443 09/05/15 0522 09/06/15 0453  NA 132* 133* 134* 132* 135  K 3.3* 3.7 4.3 4.0 3.9  CL 98* 97* 100* 96* 98*    CO2 25 25 26 27 29   GLUCOSE 184* 171* 89 156* 132*  BUN 11 10 7 12 12   CREATININE 1.25* 1.11* 1.07* 1.09* 1.19*  CALCIUM 8.4* 8.7* 8.8* 8.7* 8.6*  MG  --   --  1.8 1.9  --    ------------------------------------------------------------------------------------------------------------------ No results for input(s): CHOL, HDL, LDLCALC, TRIG, CHOLHDL, LDLDIRECT in the last 72 hours.  No results found for: HGBA1C ------------------------------------------------------------------------------------------------------------------ No results for input(s): TSH, T4TOTAL, T3FREE, THYROIDAB in the last 72 hours.  Invalid input(s): FREET3 ------------------------------------------------------------------------------------------------------------------ No results for  input(s): VITAMINB12, FOLATE, FERRITIN, TIBC, IRON, RETICCTPCT in the last 72 hours.  Coagulation profile  Recent Labs Lab 09/02/15 1050  INR 1.38    No results for input(s): DDIMER in the last 72 hours.  Cardiac Enzymes No results for input(s): CKMB, TROPONINI, MYOGLOBIN in the last 168 hours.  Invalid input(s): CK ------------------------------------------------------------------------------------------------------------------ No results found for: BNP  Micro Results Recent Results (from the past 240 hour(s))  Urine C&S     Status: Abnormal   Collection Time: 09/01/15 10:32 PM  Result Value Ref Range Status   Specimen Description URINE, CLEAN CATCH  Final   Special Requests NONE  Final   Culture MULTIPLE SPECIES PRESENT, SUGGEST RECOLLECTION (A)  Final   Report Status 09/03/2015 FINAL  Final  Aerobic / Anaerobic Culture     Status: None (Preliminary result)   Collection Time: 09/02/15  6:25 PM  Result Value Ref Range Status   Specimen Description ABSCESS LEFT FALLOPIAN TUBE  Final   Special Requests Normal  Final   Gram Stain   Final    ABUNDANT WBC PRESENT,BOTH PMN AND MONONUCLEAR ABUNDANT GRAM POSITIVE  COCCOBACILLUS    Culture   Final    NO GROWTH 3 DAYS Performed at [facility]    Report Status PENDING  Incomplete  Culture, blood (Routine X 2) w Reflex to ID Panel     Status: None (Preliminary result)   Collection Time: 09/03/15  6:54 PM  Result Value Ref Range Status   Specimen Description BLOOD RIGHT HAND  Final   Special Requests BOTTLES DRAWN AEROBIC AND ANAEROBIC 5CC  Final   Culture   Final    NO GROWTH 2 DAYS Performed at [facility]    Report Status PENDING  Incomplete  Culture, blood (Routine X 2) w Reflex to ID Panel     Status: None (Preliminary result)   Collection Time: 09/03/15  6:54 PM  Result Value Ref Range Status   Specimen Description BLOOD RIGHT ANTECUBITAL  Final   Special Requests BOTTLES DRAWN AEROBIC AND ANAEROBIC 4CC  Final   Culture   Final    NO GROWTH 2 DAYS Performed at [facility]    Report Status PENDING  Incomplete  Surgical pcr screen     Status: None   Collection Time: 09/05/15 10:36 AM  Result Value Ref Range Status   MRSA, PCR NEGATIVE NEGATIVE Final   Staphylococcus aureus NEGATIVE NEGATIVE Final    Comment:        The Xpert SA Assay (FDA approved for NASAL specimens in patients over 32 years of age), is one component of a comprehensive surveillance program.  Test performance has been validated by [facility] for patients greater than or equal to 41 year old. It is not intended to diagnose infection nor to guide or monitor treatment.     Radiology Reports Nm Renal Imaging Flow W/pharm  09/03/2015  CLINICAL DATA:  RIGHT hydronephrosis EXAM: NUCLEAR MEDICINE RENAL SCAN WITH DIURETIC ADMINISTRATION TECHNIQUE: Radionuclide angiographic and sequential renal images were obtained after intravenous injection of radiopharmaceutical. Imaging was continued during slow intravenous injection of Lasix approximately 15 minutes after the start of the examination. RADIOPHARMACEUTICALS:  15.6 mCi Technetium-13m MAG3 IV  COMPARISON:  CT 09/02/2015 FINDINGS: Flow: Delayed arterial flow to the RIGHT kidney compared to the LEFT. Left renogram: Uniform uptake of counts in the renal cortex. Counts are promptly excreted into the collecting system and cleared prior to administration of Lasix. Lasix augment clearance. No postvoid residual.  Right renogram: Delayed cortical uptake in the RIGHT kidney. RIGHT renal cortex is thinned and hydronephrosis is present. There is minimal excretion into the RIGHT renal collecting system. The RIGHT ureter is not visualized directly. Postvoid renal cortical activity and renal pelvis residual. Differential: Left kidney = 77 % Right kidney = 23 % T1/2 post Lasix : Left kidney = 3.7 minutes (counts clear significantly prior to administration of Lasix) Right kidney = no T 1/2 post lasix reached IMPRESSION: 1. Severe obstructive hydronephrosis of the RIGHT kidney. 2. Asymmetric renal differential as above. Electronically Signed   By: Suzy Bouchard M.D.   On: 09/03/2015 14:43   US Transvaginal Non-ob  09/03/2015  CLINICAL DATA:  Abdominal pain EXAM: TRANSABDOMINAL AND TRANSVAGINAL ULTRASOUND OF PELVIS TECHNIQUE: Study was performed transabdominally to optimize pelvic field of view evaluation and transvaginally to optimize internal visceral architecture evaluation. COMPARISON:  CT abdomen and pelvis Sep 02, 2015 FINDINGS: Uterus Measurements: 12.3 x 7.7 x 8.3 cm. The echogenicity of the uterus is diffusely inhomogeneous consistent with leiomyomatous change. There is a dominant mass in the midportion of the uterus anteriorly measuring 6.6 x 5.4 x 6.2 cm. Along the superior aspect of the uterus, there is a 3.0 x 3.7 x 3.4 cm mass. These lesions are felt to be consistent with leiomyomas. Endometrium Thickness: 14 mm.  Dominant uterine mass impresses upon endometrium. Right ovary Ovarian appearing tissue is not evident by transabdominal or transvaginal technique. There is a complex tubular appearing lesion in  the right adnexal area, concerning for potential hydrosalpinx. Left ovary Ovarian appearing tissue is not evident by transabdominal or transvaginal technique. There is a complex cystic area measuring 13.2 x 6.7 x 5.5 cm 5.5 cm in the left adnexal region with tubular appearing cystic fluid noted in this area as well. Other findings Minimal free fluid noted. IMPRESSION: Complex cystic structure left adnexa. Suspect bilateral hydrosalpinx. Prominent uterus with leiomyomatous change. Dominant leiomyoma measuring 6.6 x 5.4 x 6.2 cm. The current sonographic findings are commensurate with the CT findings of 1 day prior. Given the difficulty in discerning discrete extrauterine structures, correlation with pelvic MR may be reasonable in this circumstance. Electronically Signed   By: Lowella Grip III M.D.   On: 09/03/2015 16:27   US Pelvis Complete  09/03/2015  CLINICAL DATA:  Abdominal pain EXAM: TRANSABDOMINAL AND TRANSVAGINAL ULTRASOUND OF PELVIS TECHNIQUE: Study was performed transabdominally to optimize pelvic field of view evaluation and transvaginally to optimize internal visceral architecture evaluation. COMPARISON:  CT abdomen and pelvis Sep 02, 2015 FINDINGS: Uterus Measurements: 12.3 x 7.7 x 8.3 cm. The echogenicity of the uterus is diffusely inhomogeneous consistent with leiomyomatous change. There is a dominant mass in the midportion of the uterus anteriorly measuring 6.6 x 5.4 x 6.2 cm. Along the superior aspect of the uterus, there is a 3.0 x 3.7 x 3.4 cm mass. These lesions are felt to be consistent with leiomyomas. Endometrium Thickness: 14 mm.  Dominant uterine mass impresses upon endometrium. Right ovary Ovarian appearing tissue is not evident by transabdominal or transvaginal technique. There is a complex tubular appearing lesion in the right adnexal area, concerning for potential hydrosalpinx. Left ovary Ovarian appearing tissue is not evident by transabdominal or transvaginal technique. There is  a complex cystic area measuring 13.2 x 6.7 x 5.5 cm 5.5 cm in the left adnexal region with tubular appearing cystic fluid noted in this area as well. Other findings Minimal free fluid noted. IMPRESSION: Complex cystic structure left adnexa. Suspect bilateral hydrosalpinx.  Prominent uterus with leiomyomatous change. Dominant leiomyoma measuring 6.6 x 5.4 x 6.2 cm. The current sonographic findings are commensurate with the CT findings of 1 day prior. Given the difficulty in discerning discrete extrauterine structures, correlation with pelvic MR may be reasonable in this circumstance. Electronically Signed   By: Lowella Grip III M.D.   On: 09/03/2015 16:27   Ct Renal Stone Study  09/02/2015  CLINICAL DATA:  Left flank and abdominal pain, acute onset. Right blood cells and Bula blood cells in the urine. Initial encounter. EXAM: CT ABDOMEN AND PELVIS WITHOUT CONTRAST TECHNIQUE: Multidetector CT imaging of the abdomen and pelvis was performed following the standard protocol without IV contrast. COMPARISON:  Pelvic ultrasound performed 01/21/2015 FINDINGS: The visualized lung bases are clear. The liver is unremarkable in appearance. The spleen is mildly enlarged, measuring 13.7 cm in length. The gallbladder is within normal limits. The pancreas and adrenal glands are unremarkable. There is relatively severe chronic right-sided hydronephrosis, with diffuse dilatation of the right ureter. No distal obstructing stone is seen. This may reflect mass effect from the patient's hydrosalpinges. The left kidney is grossly unremarkable in appearance. Nonspecific perinephric stranding is noted bilaterally. No nonobstructing renal stones are identified. Prominent retroperitoneal nodes measure up to 1.2 cm in short axis. These are difficult to fully assess without contrast. No free fluid is identified. The small bowel is unremarkable in appearance. The stomach is within normal limits. No acute vascular abnormalities are seen.  Minimal vascular calcifications are seen. The appendix is normal in caliber, without evidence of appendicitis. The colon is grossly unremarkable in appearance. The bladder is mildly distended and grossly unremarkable. An apparent 5 cm fibroid is noted within the uterus. A likely exophytic fibroid is seen on the right. Prominent bilateral tubular cystic structures are noted at the pelvis, which may reflect recurrent bilateral hydrosalpinges, left larger than right. The ovaries are not well assessed. No inguinal lymphadenopathy is seen. No acute osseous abnormalities are identified. IMPRESSION: 1. Prominent retroperitoneal nodes measure up to 1.2 cm in short axis. These are difficult to fully assess without contrast. Malignancy cannot be entirely excluded. Would correlate with the patient's history, and consider contrast-enhanced CT or PET/CT when and as deemed clinically appropriate. 2. Relatively severe chronic right-sided hydronephrosis, with diffuse dilatation of the right ureter. This may reflect mild mass-effect from the patient's apparent hydrosalpinges. No obstructing stone seen. 3. Prominent bilateral tubular cystic structures at the pelvis, which may reflect recurrent bilateral hydrosalpinges, left larger than right. This could be further assessed on pelvic ultrasound, or on the follow-up study mentioned above. 4. Uterine fibroids again noted. 5. Mild splenomegaly. Electronically Signed   By: Garald Balding M.D.   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 She now presents with leukocytosis and severe left pelvic pain concerning for superinfection of the left hydrosalpinx. She presents to interventional radiology for percutaneous drain placement. EXAM: CT GUIDED DRAINAGE OF  ABSCESS MEDICATIONS: The patient is currently admitted to the hospital and  receiving intravenous antibiotics. The antibiotics were administered within an appropriate time frame prior to the initiation of the procedure. ANESTHESIA/SEDATION: 1 mg IV Versed 50 mcg IV Fentanyl Moderate Sedation Time:  20 The patient was continuously monitored during the procedure by the interventional radiology nurse under my direct supervision. COMPLICATIONS: None immediate. Estimated blood loss: None TECHNIQUE: Informed written consent was  obtained from the patient after a thorough discussion of the procedural risks, benefits and alternatives. All questions were addressed. A timeout was performed prior to the initiation of the procedure. PROCEDURE: A planning axial CT scan was performed. The large left pelvic low-attenuation fluid collection is successfully identified. A suitable skin entry site was selected and marked. The region was then sterilely prepped and draped in standard fashion using chlorhexidine skin prep. Local anesthesia was attained by infiltration with 1% lidocaine. A small dermatotomy was made. Under intermittent CT fluoroscopic guidance, an 18 gauge trocar needle was carefully advanced along a left trans gluteal /parasacral approach and into the fluid collection. Care was taken to avoid the adjacent rectum. An Amplatz wire was coiled within the fluid collection in the skin tract dilated to 10 [place]. A Cook 10 [place] all-purpose drainage catheter was then advanced over the wire. Approximately 200 mL of purulent fluid was successfully aspirated. A sample was sent for cultures. The tube was then secured in place with 0 Prolene suture and connected to JP bulb suction. Post drain placement axial CT imaging demonstrates a well-positioned drainage catheter with significantly reduced volume of the left hydrosalpinx. FINDINGS: Approximately 200 mL thick, purulent and foul smelling fluid in the left fallopian tube consistent with pyosalpinx. IMPRESSION: Technically successful placement of a left  trans gluteal approach drainage catheter into the left-sided pyosalpinx. Aspiration yields 200 mL purulent fluid. A sample was sent for culture. Signed, Criselda Peaches, MD [facility] Electronically Signed   By: Jacqulynn Cadet M.D.   On: 09/05/2015 09:17    Time Spent in minutes  30   Terese Heier K M.D on 09/06/2015 at 10:30 AM  Between 7am to 7pm - Pager - [phone]  After 7pm go to [email or web address] - password [facility]  [facility] -  Office  [phone]

## 2015-09-06 NOTE — Discharge Instructions (Addendum)
Follow with Primary MD in 7 days   Get CBC, CMP, 2 view Chest X ray checked  by Primary MD next visit.    Activity: As tolerated with Full fall precautions use walker/cane & assistance as needed   Disposition Home     Diet:   Heart Healthy Low Carb.  For Heart failure patients - Check your Weight same time everyday, if you gain over 2 pounds, or you develop in leg swelling, experience more shortness of breath or chest pain, call your Primary MD immediately. Follow Cardiac Low Salt Diet and 1.5 lit/day fluid restriction.   On your next visit with your primary care physician please Get Medicines reviewed and adjusted.   Please request your Prim.MD to go over all Hospital Tests and Procedure/Radiological results at the follow up, please get all Hospital records sent to your Prim MD by signing hospital release before you go home.   If you experience worsening of your admission symptoms, develop shortness of breath, life threatening emergency, suicidal or homicidal thoughts you must seek medical attention immediately by calling 911 or calling your MD immediately  if symptoms less severe.  You Must read complete instructions/literature along with all the possible adverse reactions/side effects for all the Medicines you take and that have been prescribed to you. Take any new Medicines after you have completely understood and accpet all the possible adverse reactions/side effects.   Do not drive, operate heavy machinery, perform activities at heights, swimming or participation in water activities or provide baby sitting services if your were admitted for syncope or siezures until you have seen by Primary MD or a Neurologist and advised to do so again.  Do not drive when taking Pain medications.    Do not take more than prescribed Pain, Sleep and Anxiety Medications  Special Instructions: If you have smoked or chewed Tobacco  in the last 2 yrs please stop smoking, stop any regular Alcohol   and or any Recreational drug use.  Wear Seat belts while driving.   Please note  You were cared for by a hospitalist during your hospital stay. If you have any questions about your discharge medications or the care you received while you were in the hospital after you are discharged, you can call the unit and asked to speak with the hospitalist on call if the hospitalist that took care of you is not available. Once you are discharged, your primary care physician will handle any further medical issues. Please note that NO REFILLS for any discharge medications will be authorized once you are discharged, as it is imperative that you return to your primary care physician (or establish a relationship with a primary care physician if you do not have one) for your aftercare needs so that they can reassess your need for medications and monitor your lab values.           Ureteral Stent Implantation Ureteral stent implantation is the implantation of a soft plastic tube with multiple holes into the tube that drains urine from your kidney to your bladder (ureter). The stent helps drain your kidney when there is a blockage of the flow of urine in your ureter. The stent has a coil on each end to keep it from falling out. One end stays in the kidney. The other end stays in the bladder. It is most often taken out after any blockage has been removed or your ureter has healed. Short-term stents have a string attached to make removal quite easy.  Removal of a short-term stent can be done in your health care provider's office or by you at home. Long-term stents need to be changed every few months. LET [facility] CARE PROVIDER KNOW ABOUT:  Any allergies you have.  All medicines you are taking, including vitamins, herbs, eye drops, creams, and over-the-counter medicines.  Previous problems you or members of your family have had with the use of anesthetics.  Any blood disorders you have.  Previous surgeries  you have had.  Medical conditions you have. RISKS AND COMPLICATIONS Generally, ureteral stent implantation is a safe procedure. However, as with any procedure, complications can occur. Possible complications include:  Movement of the stent away from where it was originally placed (migration). This may affect the ability of the stent to properly drain your kidney. If migration of the stent occurs, the stent may need to be replaced or repositioned.  Perforation of the ureter.  Infection. BEFORE THE PROCEDURE  You may be asked to wash your genital area with sterile soap the morning of your procedure.  You may be given an oral antibiotic which you should take with a sip of water as prescribed by your health care provider.  You may be asked to not eat or drink for 8 hours before the surgery. PROCEDURE  First you will be given an anesthetic so you do not feel pain during the procedure.  Your health care provider will insert a special lighted instrument called a cystoscope into your bladder. This allows your health care provider to see the opening to your ureter.  A thin wire is carefully threaded into your bladder and up the ureter. The stent is inserted over the wire and the wire is then removed.  Your bladder will be emptied of urine. AFTER THE PROCEDURE You will be taken to a recovery room until it is okay for you to go home.   This information is not intended to replace advice given to you by your health care provider. Make sure you discuss any questions you have with your health care provider.   Document Released: 03/20/2000 Document Revised: 03/28/2013 Document Reviewed: 10/05/2014 Elsevier Interactive Patient Education [employer].

## 2015-09-06 NOTE — Progress Notes (Signed)
Patient ID: Amy Lowe, female   DOB: [birth year], 39 y.o.   MRN: [id number]  I was not able to get in to see Amy Lowe this morning but her stent placement went well yesterday.   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    She will need a possible reimplantation of the right ureter at the time of her hysterectomy.   At this time it is still to be determined where and when that will be performed.     If the hysterectomy is delayed, she would need f/u with me in at the latest 3 months.    I will request a f/u  Visit in 1 month in my office.

## 2015-09-06 NOTE — Progress Notes (Signed)
Patient ID: Amy Lowe, female   DOB: 03-May-1975, 40 y.o.   MRN: [id number]    Referring Physician(s): Joyice Faster  Supervising Physician: Aletta Edouard  Patient Status: In-pt  Chief Complaint:  hydrosalpinx  Subjective:  Pt ambulating in hallway; still having occ "gas" pains, some bleeding from hemorrhoid; had rt ureteral stent placed yesterday  Allergies: Penicillins and Iodinated diagnostic agents  Medications: Prior to Admission medications   Medication Sig Start Date End Date Taking? Authorizing Provider  diphenhydrAMINE (BENADRYL) 25 MG tablet Take 25 mg by mouth every 6 (six) hours as needed. For allergy symptom relief   Yes Historical Provider, MD  HYDROcodone-acetaminophen (NORCO/VICODIN) 5-325 MG tablet Take 1 tablet by mouth every 6 (six) hours as needed for moderate pain.   Yes Historical Provider, MD  ibuprofen (ADVIL,MOTRIN) 200 MG tablet Take 600 mg by mouth every 6 (six) hours as needed. For pain relief   Yes Historical Provider, MD  insulin detemir (LEVEMIR) 100 UNIT/ML injection Inject 18 Units into the skin 2 (two) times daily.   Yes Historical Provider, MD  insulin lispro (HUMALOG) 100 UNIT/ML injection Inject 8 Units into the skin 3 (three) times daily before meals.   Yes Historical Provider, MD  lisinopril (PRINIVIL,ZESTRIL) 10 MG tablet Take 10 mg by mouth daily.   Yes Historical Provider, MD  tranexamic acid (LYSTEDA) 650 MG TABS tablet Take 1 tablet by mouth 3 (three) times daily. Take two tablets three times daily during the first five days of menses. 08/22/15  Yes Historical Provider, MD  clindamycin (CLEOCIN) 150 MG capsule 2 PO TID Patient not taking: Reported on 01/20/2015 10/15/11   Dalia Heading, PA-C  oxyCODONE-acetaminophen (PERCOCET/ROXICET) 5-325 MG per tablet Take 1 tablet by mouth every 4 (four) hours as needed for pain. Patient not taking: Reported on 01/20/2015 08/02/12   Pattricia Boss, MD     Vital Signs: BP 139/65 mmHg  Pulse 77   Temp(Src) 97.9 F (36.6 C) (Oral)  Resp 20  Ht 5\' 4"  (1.626 m)  Wt 202 lb (91.627 kg)  BMI 34.66 kg/m2  SpO2 100%  LMP 08/23/2015  Physical Exam awake/alert; TG drain intact, output minimal; cx's pend, insertion site ok, mildly tender  Imaging: Nm Renal Imaging Flow W/pharm  09/03/2015  CLINICAL DATA:  RIGHT hydronephrosis EXAM: NUCLEAR MEDICINE RENAL SCAN WITH DIURETIC ADMINISTRATION TECHNIQUE: Radionuclide angiographic and sequential renal images were obtained after intravenous injection of radiopharmaceutical. Imaging was continued during slow intravenous injection of Lasix approximately 15 minutes after the start of the examination. RADIOPHARMACEUTICALS:  15.6 mCi Technetium-48m MAG3 IV COMPARISON:  CT 09/02/2015 FINDINGS: Flow: Delayed arterial flow to the RIGHT kidney compared to the LEFT. Left renogram: Uniform uptake of counts in the renal cortex. Counts are promptly excreted into the collecting system and cleared prior to administration of Lasix. Lasix augment clearance. No postvoid residual. Right renogram: Delayed cortical uptake in the RIGHT kidney. RIGHT renal cortex is thinned and hydronephrosis is present. There is minimal excretion into the RIGHT renal collecting system. The RIGHT ureter is not visualized directly. Postvoid renal cortical activity and renal pelvis residual. Differential: Left kidney = 77 % Right kidney = 23 % T1/2 post Lasix : Left kidney = 3.7 minutes (counts clear significantly prior to administration of Lasix) Right kidney = no T 1/2 post lasix reached IMPRESSION: 1. Severe obstructive hydronephrosis of the RIGHT kidney. 2. Asymmetric renal differential as above. Electronically Signed   By: Suzy Bouchard M.D.   On: 09/03/2015 14:43   US Transvaginal  Non-ob  09/03/2015  CLINICAL DATA:  Abdominal pain EXAM: TRANSABDOMINAL AND TRANSVAGINAL ULTRASOUND OF PELVIS TECHNIQUE: Study was performed transabdominally to optimize pelvic field of view evaluation and  transvaginally to optimize internal visceral architecture evaluation. COMPARISON:  CT abdomen and pelvis Sep 02, 2015 FINDINGS: Uterus Measurements: 12.3 x 7.7 x 8.3 cm. The echogenicity of the uterus is diffusely inhomogeneous consistent with leiomyomatous change. There is a dominant mass in the midportion of the uterus anteriorly measuring 6.6 x 5.4 x 6.2 cm. Along the superior aspect of the uterus, there is a 3.0 x 3.7 x 3.4 cm mass. These lesions are felt to be consistent with leiomyomas. Endometrium Thickness: 14 mm.  Dominant uterine mass impresses upon endometrium. Right ovary Ovarian appearing tissue is not evident by transabdominal or transvaginal technique. There is a complex tubular appearing lesion in the right adnexal area, concerning for potential hydrosalpinx. Left ovary Ovarian appearing tissue is not evident by transabdominal or transvaginal technique. There is a complex cystic area measuring 13.2 x 6.7 x 5.5 cm 5.5 cm in the left adnexal region with tubular appearing cystic fluid noted in this area as well. Other findings Minimal free fluid noted. IMPRESSION: Complex cystic structure left adnexa. Suspect bilateral hydrosalpinx. Prominent uterus with leiomyomatous change. Dominant leiomyoma measuring 6.6 x 5.4 x 6.2 cm. The current sonographic findings are commensurate with the CT findings of 1 day prior. Given the difficulty in discerning discrete extrauterine structures, correlation with pelvic MR may be reasonable in this circumstance. Electronically Signed   By: Lowella Grip III M.D.   On: 09/03/2015 16:27   US Pelvis Complete  09/03/2015  CLINICAL DATA:  Abdominal pain EXAM: TRANSABDOMINAL AND TRANSVAGINAL ULTRASOUND OF PELVIS TECHNIQUE: Study was performed transabdominally to optimize pelvic field of view evaluation and transvaginally to optimize internal visceral architecture evaluation. COMPARISON:  CT abdomen and pelvis Sep 02, 2015 FINDINGS: Uterus Measurements: 12.3 x 7.7 x 8.3  cm. The echogenicity of the uterus is diffusely inhomogeneous consistent with leiomyomatous change. There is a dominant mass in the midportion of the uterus anteriorly measuring 6.6 x 5.4 x 6.2 cm. Along the superior aspect of the uterus, there is a 3.0 x 3.7 x 3.4 cm mass. These lesions are felt to be consistent with leiomyomas. Endometrium Thickness: 14 mm.  Dominant uterine mass impresses upon endometrium. Right ovary Ovarian appearing tissue is not evident by transabdominal or transvaginal technique. There is a complex tubular appearing lesion in the right adnexal area, concerning for potential hydrosalpinx. Left ovary Ovarian appearing tissue is not evident by transabdominal or transvaginal technique. There is a complex cystic area measuring 13.2 x 6.7 x 5.5 cm 5.5 cm in the left adnexal region with tubular appearing cystic fluid noted in this area as well. Other findings Minimal free fluid noted. IMPRESSION: Complex cystic structure left adnexa. Suspect bilateral hydrosalpinx. Prominent uterus with leiomyomatous change. Dominant leiomyoma measuring 6.6 x 5.4 x 6.2 cm. The current sonographic findings are commensurate with the CT findings of 1 day prior. Given the difficulty in discerning discrete extrauterine structures, correlation with pelvic MR may be reasonable in this circumstance. Electronically Signed   By: Lowella Grip III M.D.   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 She now presents with leukocytosis and severe left pelvic pain concerning for superinfection of the left hydrosalpinx. She presents to interventional radiology for  percutaneous drain placement. EXAM: CT GUIDED DRAINAGE OF  ABSCESS MEDICATIONS: The patient is currently admitted to the hospital and receiving intravenous antibiotics. The antibiotics were administered within  an appropriate time frame prior to the initiation of the procedure. ANESTHESIA/SEDATION: 1 mg IV Versed 50 mcg IV Fentanyl Moderate Sedation Time:  20 The patient was continuously monitored during the procedure by the interventional radiology nurse under my direct supervision. COMPLICATIONS: None immediate. Estimated blood loss: None TECHNIQUE: Informed written consent was obtained from the patient after a thorough discussion of the procedural risks, benefits and alternatives. All questions were addressed. A timeout was performed prior to the initiation of the procedure. PROCEDURE: A planning axial CT scan was performed. The large left pelvic low-attenuation fluid collection is successfully identified. A suitable skin entry site was selected and marked. The region was then sterilely prepped and draped in standard fashion using chlorhexidine skin prep. Local anesthesia was attained by infiltration with 1% lidocaine. A small dermatotomy was made. Under intermittent CT fluoroscopic guidance, an 18 gauge trocar needle was carefully advanced along a left trans gluteal /parasacral approach and into the fluid collection. Care was taken to avoid the adjacent rectum. An Amplatz wire was coiled within the fluid collection in the skin tract dilated to 10 [place]. A Cook 10 [place] all-purpose drainage catheter was then advanced over the wire. Approximately 200 mL of purulent fluid was successfully aspirated. A sample was sent for cultures. The tube was then secured in place with 0 Prolene suture and connected to JP bulb suction. Post drain placement axial CT imaging demonstrates a well-positioned drainage catheter with significantly reduced volume of the left hydrosalpinx. FINDINGS: Approximately 200 mL thick, purulent and foul smelling fluid in the left fallopian tube consistent with pyosalpinx. IMPRESSION: Technically successful placement of a left trans gluteal approach drainage catheter into the left-sided pyosalpinx.  Aspiration yields 200 mL purulent fluid. A sample was sent for culture. Signed, Criselda Peaches, MD [facility]iology Electronically Signed   By: Jacqulynn Cadet M.D.   On: 09/05/2015 09:17    Labs:  CBC:  Recent Labs  09/03/15 0443  09/04/15 0458 09/04/15 1626 09/05/15 0522 09/06/15 0453  WBC 17.7*  --  19.1*  --  18.4* 15.2*  HGB 6.2*  < > 6.8* 8.8* 8.3* 7.8*  HCT 21.8*  < > 22.9* 29.0* 27.1* 25.8*  PLT 292  --  326  --  385 365  < > = values in this interval not displayed.  COAGS:  Recent Labs  09/02/15 1050  INR 1.38    BMP:  Recent Labs  09/02/15 0933 09/03/15 0443 09/05/15 0522 09/06/15 0453  NA 133* 134* 132* 135  K 3.7 4.3 4.0 3.9  CL 97* 100* 96* 98*  CO2 25 26 27 29   GLUCOSE 171* 89 156* 132*  BUN 10 7 12 12   CALCIUM 8.7* 8.8* 8.7* 8.6*  CREATININE 1.11* 1.07* 1.09* 1.19*  GFRNONAA >60 >60 >60 56*  GFRAA >60 >60 >60 >60    LIVER FUNCTION TESTS: No results for input(s): BILITOT, AST, ALT, ALKPHOS, PROT, ALBUMIN in the last 8760 hours.  Assessment and Plan: S/p drainage of left hydro/pyosalpinx 5/29; AF; WBC 15.2, hgb 7.8 (8.3), creat 1.19; check final drain fluid cx results; cont with NS irrigation- once daily as OP; can set pt up for f/u CT next week at IR clinic if d/c'd home tomorrow   Electronically Signed: D. Rowe Robert 09/06/2015, 3:52 PM   I  spent a total of 15 minutes at the the patient's bedside AND on the patient's hospital floor or unit, greater than 50% of which was counseling/coordinating care for pelvic fluid collection drain

## 2015-09-07 ENCOUNTER — Other Ambulatory Visit: Payer: Self-pay | Admitting: General Surgery

## 2015-09-07 DIAGNOSIS — N7093 Salpingitis and oophoritis, unspecified: Secondary | ICD-10-CM

## 2015-09-07 LAB — CBC
HEMATOCRIT: 25 % — AB (ref 36.0–46.0)
HEMOGLOBIN: 7.5 g/dL — AB (ref 12.0–15.0)
MCH: 19.7 pg — ABNORMAL LOW (ref 26.0–34.0)
MCHC: 30 g/dL (ref 30.0–36.0)
MCV: 65.8 fL — AB (ref 78.0–100.0)
Platelets: 407 10*3/uL — ABNORMAL HIGH (ref 150–400)
RBC: 3.8 MIL/uL — ABNORMAL LOW (ref 3.87–5.11)
WBC: 12.7 10*3/uL — AB (ref 4.0–10.5)

## 2015-09-07 LAB — GLUCOSE, CAPILLARY: Glucose-Capillary: 77 mg/dL (ref 65–99)

## 2015-09-07 MED ORDER — FERROUS SULFATE 325 (65 FE) MG PO TABS: 325.0000 mg | ORAL_TABLET | Freq: Three times a day (TID) | ORAL | Status: DC

## 2015-09-07 MED ORDER — POLYETHYLENE GLYCOL 3350 17 G PO PACK: 17.0000 g | PACK | Freq: Two times a day (BID) | ORAL | Status: DC

## 2015-09-07 MED ORDER — MEGESTROL ACETATE 20 MG PO TABS: 20.0000 mg | ORAL_TABLET | Freq: Two times a day (BID) | ORAL | Status: DC

## 2015-09-07 MED ORDER — METRONIDAZOLE 500 MG PO TABS: 500.0000 mg | ORAL_TABLET | Freq: Three times a day (TID) | ORAL | Status: DC

## 2015-09-07 MED ORDER — OXYCODONE-ACETAMINOPHEN 5-325 MG PO TABS: 1.0000 | ORAL_TABLET | ORAL | Status: DC | PRN

## 2015-09-07 MED ORDER — HYDROCORTISONE 2.5 % RE CREA: TOPICAL_CREAM | Freq: Three times a day (TID) | RECTAL | Status: AC

## 2015-09-07 MED ORDER — NYSTATIN-TRIAMCINOLONE 100000-0.1 UNIT/GM-% EX CREA
TOPICAL_CREAM | Freq: Two times a day (BID) | CUTANEOUS | Status: AC
Start: 2015-09-07 — End: ?

## 2015-09-07 MED ORDER — DOXYCYCLINE HYCLATE 100 MG PO CAPS: 100.0000 mg | ORAL_CAPSULE | Freq: Two times a day (BID) | ORAL | Status: DC

## 2015-09-07 NOTE — Progress Notes (Signed)
Urology Progress Note  2 Days Post-Op   Subjective: Right ureteral obstruction at the level of the intramural ureter. Now post Right  Retrograde pyelography and insertion of right JJ stent ( 26F x 26 cm) per Dr. Jeffie Pollock on 09/06/15).      No acute urologic events overnight. Tolerating the Right JJ stent well.  Ambulation:   positive Flatus:    positive Bowel movement  negative  Pain: some relief  Objective:  Blood pressure 128/66, pulse 66, temperature 98.3 F (36.8 C), temperature source Oral, resp. rate 20, height 5\' 4"  (1.626 m), weight 91.763 kg (202 lb 4.8 oz), last menstrual period 08/23/2015, SpO2 100 %.  Physical Exam:  General:  No acute distress, awake abd: Left lower quadrant pain: Left trans gluteal drain. Genitourinary: No right flank psin Foley: none    I/O last 3 completed shifts: In: 19 [Other:10; IV Piggyback:512] Out: 16 [Urine:1; Drains:15]  Recent Labs     09/06/15  0453  09/07/15  0501  HGB  7.8*  7.5*  WBC  15.2*  12.7*  PLT  365  407*    Recent Labs     09/05/15  0522  09/06/15  0453  NA  132*  135  K  4.0  3.9  CL  96*  98*  CO2  27  29  BUN  12  12  CREATININE  1.09*  1.19*  CALCIUM  8.7*  8.6*  GFRNONAA  >60  56*  GFRAA  >60  >60     No results for input(s): INR, APTT in the last 72 hours.  Invalid input(s): PT   Invalid input(s): ABG  Assessment/Plan:  Follow up Dr. Jeffie Pollock

## 2015-09-07 NOTE — Progress Notes (Signed)
Patient given discharge, follow up, and medication instructions, verbalized understanding, instructed on drain management with return demo, IVs removed, family to transport home

## 2015-09-07 NOTE — Discharge Summary (Signed)
Amy Lowe, is a 40 y.o. female  DOB [birth year]  MRN [id number].  Admission date:  09/01/2015  Admitting Physician  [name], MD  Discharge Date:  09/07/2015   Primary MD  PROVIDER NOT IN SYSTEM  Recommendations for primary care physician for things to follow:   Check CBC CMP within 3-4 days.  She needs to follow with IR and her OB physician within a week   Admission Diagnosis  Hydrosalpinx [N70.11] Pyelonephritis [N12] Renal insufficiency [N28.9] Anemia due to blood loss [D50.0] Left flank pain [R10.9] Hydronephrosis, unspecified hydronephrosis type [N13.30]   Discharge Diagnosis  Hydrosalpinx [N70.11] Pyelonephritis [N12] Renal insufficiency [N28.9] Anemia due to blood loss [D50.0] Left flank pain [R10.9] Hydronephrosis, unspecified hydronephrosis type [N13.30]    Principal Problem:   Acute blood loss anemia Active Problems:   UTI (lower urinary tract infection)   Diabetes mellitus type 2, controlled (HCC)   Essential hypertension   Abdominal pain   Anemia   Hydrosalpinx   Chronic PID (chronic pelvic inflammatory disease)   Fibroid uterus   Hydronephrosis of right kidney   PID (acute pelvic inflammatory disease)      Past Medical History  Diagnosis Date  . Diabetes mellitus   . Anemia     Past Surgical History  Procedure Laterality Date  . Abdominal laparascopy    . Dilation and curettage of uterus    . Hernia repair    . Dnc    . Cystoscopy w/ ureteral stent placement Right 09/05/2015    Procedure: CYSTO WITH RIGHT RETROGRADE AND STENT PLACEMENT;  Surgeon: Irine Seal, MD;  Location: [facility];  Service: Urology;  Laterality: Right;       HPI  from the history and physical done on the day of admission:    Amy Lowe is a 40 y.o. female with medical history significant  of with chronic and deficiency anemia previously received IV iron therapy, fibroid uterus, on diabetes mellitus, hypertension presents to the ER because of increasing pain in the abdomen on the left side over the last 2-3 days. The pain was radiating to her left groin. Had some nausea denies any vomiting or diarrhea. Denies any fever chills chest pain or shortness of breath. In the ER patient's hemoglobin was found to be around 3.8 and UA shows patient is consistent with UTI. CT abdomen and pelvis done shows bilateral hydrosalpinx with right-sided hydronephrosis and hydroureter.     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 Long-standing history of fibroid uterus with menorrhagia. Patient follows with GYN physician Dr. Katherine Mantle, she has been advised to undergo hysterectomy multiple times in the past.  She has been seen by both GYN and urology here, no urological procedures, she refuses hysterectomy again, OB consulted IR for left-sided hydrosalpinx and drainage which was done on 09/02/2015 with 200 mL of pus drained, She has a left gluteal approach drain in place, drain care to be taught by nursing staff, cultures Negative  as she was on antibiotics, she was treated here with clindamycin and gentamicin for PID by OB-GYN. Clinically better will follow with both OB and IR.   Discussed with Dr. Elly Modena OB/GYN on 09/04/2015, once she is afebrile place on PO Doxy-Flagyl for a total of 14 days. Followed by outpatient hysterectomy 6-8 weeks later. She will follow with her primary OB physician Dr. Juluis Mire within a week. Much improved clinically and almost completely symptom free this morning.   2. Severe microcytic iron deficiency anemia. Due to menorrhagia, anemia panel pending, she is now status 3 units PRBC transfusion, initially refusing further transfusions but now agreeable we'll give her another unit on 09/04/2015. Have placed  her on oral iron.  3. Severe menorrhagia. She is currently on Megace to Tranexamic acid per GYN, GYN on case.  4. UTI. Chemically resolved after empiric treatment for hydrosalpinx with gentamicin and clindamycin. She is post Cystoscopy with right retrograde pyelogram and interpretation. Insertion of right double-J stent on 09-06-15. Urine culture is inconclusive. She will follow with urology post discharge as well.  5. DM type II. Continue home regimen follow with PCP.   Follow UP  Follow-up Information    Follow up with Malka So, MD.   Specialty:  Urology   Why:  Please contact the office for a follow up appointment for about 3-4 weeks you will need to be seen by one of the nurse practitioners at that time.Minette Brine information:   [place] [address] [phone]       Follow up with [facility], HEATH, MD. Schedule an appointment as soon as possible for a visit in 3 days.   Specialties:  Interventional Radiology, Radiology   Why:  [facility] information:   [address] [place] [address] [phone]       Follow up with Clydie Braun, MD. Schedule an appointment as soon as possible for a visit in 3 days.   Specialty:  Obstetrics and Gynecology   Contact information:   [address] [place] [address] [phone]       Follow up with Your Primary MD . Schedule an appointment as soon as possible for a visit in 2 days.       Consults obtained - OB, urology, IR  Discharge Condition: Stable  Diet and Activity recommendation: See Discharge Instructions below  Discharge Instructions           Discharge Instructions    Discharge instructions    Complete by:  As directed   Follow with Primary MD in 7 days   Get CBC, CMP, 2 view Chest X ray checked  by Primary MD next visit.    Activity: As tolerated with Full fall precautions use walker/cane & assistance as needed   Disposition Home     Diet:    Heart Healthy Low Carb.  For Heart failure patients - Check your Weight same time everyday, if you gain over 2 pounds, or you develop in leg swelling, experience more shortness of breath or chest pain, call your Primary MD immediately. Follow Cardiac Low Salt Diet and 1.5 lit/day fluid restriction.   On your next visit with your primary care physician please Get Medicines reviewed and adjusted.   Please request your Prim.MD to go over all Hospital Tests and Procedure/Radiological results at the follow up, please get all Hospital records sent to your Prim MD by signing hospital release before you go  home.   If you experience worsening of your admission symptoms, develop shortness of breath, life threatening emergency, suicidal or homicidal thoughts you must seek medical attention immediately by calling 911 or calling your MD immediately  if symptoms less severe.  You Must read complete instructions/literature along with all the possible adverse reactions/side effects for all the Medicines you take and that have been prescribed to you. Take any new Medicines after you have completely understood and accpet all the possible adverse reactions/side effects.   Do not drive, operate heavy machinery, perform activities at heights, swimming or participation in water activities or provide baby sitting services if your were admitted for syncope or siezures until you have seen by Primary MD or a Neurologist and advised to do so again.  Do not drive when taking Pain medications.    Do not take more than prescribed Pain, Sleep and Anxiety Medications  Special Instructions: If you have smoked or chewed Tobacco  in the last 2 yrs please stop smoking, stop any regular Alcohol  and or any Recreational drug use.  Wear Seat belts while driving.   Please note  You were cared for by a hospitalist during your hospital stay. If you have any questions about your discharge medications or the care you received  while you were in the hospital after you are discharged, you can call the unit and asked to speak with the hospitalist on call if the hospitalist that took care of you is not available. Once you are discharged, your primary care physician will handle any further medical issues. Please note that NO REFILLS for any discharge medications will be authorized once you are discharged, as it is imperative that you return to your primary care physician (or establish a relationship with a primary care physician if you do not have one) for your aftercare needs so that they can reassess your need for medications and monitor your lab values.     Increase activity slowly    Complete by:  As directed              Discharge Medications       Medication List    STOP taking these medications        clindamycin 150 MG capsule  Commonly known as:  CLEOCIN     HYDROcodone-acetaminophen 5-325 MG tablet  Commonly known as:  NORCO/VICODIN     ibuprofen 200 MG tablet  Commonly known as:  ADVIL,MOTRIN     lisinopril 10 MG tablet  Commonly known as:  PRINIVIL,ZESTRIL      TAKE these medications        diphenhydrAMINE 25 MG tablet  Commonly known as:  BENADRYL  Take 25 mg by mouth every 6 (six) hours as needed. For allergy symptom relief     doxycycline 100 MG capsule  Commonly known as:  VIBRAMYCIN  Take 1 capsule (100 mg total) by mouth 2 (two) times daily.     ferrous sulfate 325 (65 FE) MG tablet  Take 1 tablet (325 mg total) by mouth 3 (three) times daily with meals.     hydrocortisone 2.5 % rectal cream  Commonly known as:  ANUSOL-HC  Place rectally 3 (three) times daily.     insulin detemir 100 UNIT/ML injection  Commonly known as:  LEVEMIR  Inject 18 Units into the skin 2 (two) times daily.     insulin lispro 100 UNIT/ML injection  Commonly known as:  HUMALOG  Inject 8 Units into the skin 3 (  three) times daily before meals.     megestrol 20 MG tablet  Commonly known as:  MEGACE    Take 1 tablet (20 mg total) by mouth 2 (two) times daily.     metroNIDAZOLE 500 MG tablet  Commonly known as:  FLAGYL  Take 1 tablet (500 mg total) by mouth 3 (three) times daily.     nystatin-triamcinolone cream  Commonly known as:  MYCOLOG II  Apply topically 2 (two) times daily.     oxyCODONE-acetaminophen 5-325 MG tablet  Commonly known as:  PERCOCET/ROXICET  Take 1 tablet by mouth every 4 (four) hours as needed.     polyethylene glycol packet  Commonly known as:  MIRALAX / GLYCOLAX  Take 17 g by mouth 2 (two) times daily.     tranexamic acid 650 MG Tabs tablet  Commonly known as:  LYSTEDA  Take 1 tablet by mouth 3 (three) times daily. Take two tablets three times daily during the first five days of menses.        Major procedures and Radiology Reports - PLEASE review detailed and final reports for all details, in brief -     CT abdomen and pelvis showing R.hydronephrosis and hydrosalpinx.  CT-guided left hydrosalpinx drainage by IR with 200 mL of pus removed.  Cystoscopy with right retrograde pyelogram and interpretation. Insertion of right double-J stent. by Dr. Shirlyn Goltz on 09/05/2015.   Nm Renal Imaging Flow W/pharm  09/03/2015  CLINICAL DATA:  RIGHT hydronephrosis EXAM: NUCLEAR MEDICINE RENAL SCAN WITH DIURETIC ADMINISTRATION TECHNIQUE: Radionuclide angiographic and sequential renal images were obtained after intravenous injection of radiopharmaceutical. Imaging was continued during slow intravenous injection of Lasix approximately 15 minutes after the start of the examination. RADIOPHARMACEUTICALS:  15.6 mCi Technetium-71m MAG3 IV COMPARISON:  CT 09/02/2015 FINDINGS: Flow: Delayed arterial flow to the RIGHT kidney compared to the LEFT. Left renogram: Uniform uptake of counts in the renal cortex. Counts are promptly excreted into the collecting system and cleared prior to administration of Lasix. Lasix augment clearance. No postvoid residual. Right renogram: Delayed  cortical uptake in the RIGHT kidney. RIGHT renal cortex is thinned and hydronephrosis is present. There is minimal excretion into the RIGHT renal collecting system. The RIGHT ureter is not visualized directly. Postvoid renal cortical activity and renal pelvis residual. Differential: Left kidney = 77 % Right kidney = 23 % T1/2 post Lasix : Left kidney = 3.7 minutes (counts clear significantly prior to administration of Lasix) Right kidney = no T 1/2 post lasix reached IMPRESSION: 1. Severe obstructive hydronephrosis of the RIGHT kidney. 2. Asymmetric renal differential as above. Electronically Signed   By: Suzy Bouchard M.D.   On: 09/03/2015 14:43   US Transvaginal Non-ob  09/03/2015  CLINICAL DATA:  Abdominal pain EXAM: TRANSABDOMINAL AND TRANSVAGINAL ULTRASOUND OF PELVIS TECHNIQUE: Study was performed transabdominally to optimize pelvic field of view evaluation and transvaginally to optimize internal visceral architecture evaluation. COMPARISON:  CT abdomen and pelvis Sep 02, 2015 FINDINGS: Uterus Measurements: 12.3 x 7.7 x 8.3 cm. The echogenicity of the uterus is diffusely inhomogeneous consistent with leiomyomatous change. There is a dominant mass in the midportion of the uterus anteriorly measuring 6.6 x 5.4 x 6.2 cm. Along the superior aspect of the uterus, there is a 3.0 x 3.7 x 3.4 cm mass. These lesions are felt to be consistent with leiomyomas. Endometrium Thickness: 14 mm.  Dominant uterine mass impresses upon endometrium. Right ovary Ovarian appearing tissue is not evident by transabdominal or transvaginal technique. There  is a complex tubular appearing lesion in the right adnexal area, concerning for potential hydrosalpinx. Left ovary Ovarian appearing tissue is not evident by transabdominal or transvaginal technique. There is a complex cystic area measuring 13.2 x 6.7 x 5.5 cm 5.5 cm in the left adnexal region with tubular appearing cystic fluid noted in this area as well. Other findings Minimal  free fluid noted. IMPRESSION: Complex cystic structure left adnexa. Suspect bilateral hydrosalpinx. Prominent uterus with leiomyomatous change. Dominant leiomyoma measuring 6.6 x 5.4 x 6.2 cm. The current sonographic findings are commensurate with the CT findings of 1 day prior. Given the difficulty in discerning discrete extrauterine structures, correlation with pelvic MR may be reasonable in this circumstance. Electronically Signed   By: Lowella Grip III M.D.   On: 09/03/2015 16:27   US Pelvis Complete  09/03/2015  CLINICAL DATA:  Abdominal pain EXAM: TRANSABDOMINAL AND TRANSVAGINAL ULTRASOUND OF PELVIS TECHNIQUE: Study was performed transabdominally to optimize pelvic field of view evaluation and transvaginally to optimize internal visceral architecture evaluation. COMPARISON:  CT abdomen and pelvis Sep 02, 2015 FINDINGS: Uterus Measurements: 12.3 x 7.7 x 8.3 cm. The echogenicity of the uterus is diffusely inhomogeneous consistent with leiomyomatous change. There is a dominant mass in the midportion of the uterus anteriorly measuring 6.6 x 5.4 x 6.2 cm. Along the superior aspect of the uterus, there is a 3.0 x 3.7 x 3.4 cm mass. These lesions are felt to be consistent with leiomyomas. Endometrium Thickness: 14 mm.  Dominant uterine mass impresses upon endometrium. Right ovary Ovarian appearing tissue is not evident by transabdominal or transvaginal technique. There is a complex tubular appearing lesion in the right adnexal area, concerning for potential hydrosalpinx. Left ovary Ovarian appearing tissue is not evident by transabdominal or transvaginal technique. There is a complex cystic area measuring 13.2 x 6.7 x 5.5 cm 5.5 cm in the left adnexal region with tubular appearing cystic fluid noted in this area as well. Other findings Minimal free fluid noted. IMPRESSION: Complex cystic structure left adnexa. Suspect bilateral hydrosalpinx. Prominent uterus with leiomyomatous change. Dominant leiomyoma  measuring 6.6 x 5.4 x 6.2 cm. The current sonographic findings are commensurate with the CT findings of 1 day prior. Given the difficulty in discerning discrete extrauterine structures, correlation with pelvic MR may be reasonable in this circumstance. Electronically Signed   By: Lowella Grip III M.D.   On: 09/03/2015 16:27   Ct Renal Stone Study  09/02/2015  CLINICAL DATA:  Left flank and abdominal pain, acute onset. Right blood cells and Driggers blood cells in the urine. Initial encounter. EXAM: CT ABDOMEN AND PELVIS WITHOUT CONTRAST TECHNIQUE: Multidetector CT imaging of the abdomen and pelvis was performed following the standard protocol without IV contrast. COMPARISON:  Pelvic ultrasound performed 01/21/2015 FINDINGS: The visualized lung bases are clear. The liver is unremarkable in appearance. The spleen is mildly enlarged, measuring 13.7 cm in length. The gallbladder is within normal limits. The pancreas and adrenal glands are unremarkable. There is relatively severe chronic right-sided hydronephrosis, with diffuse dilatation of the right ureter. No distal obstructing stone is seen. This may reflect mass effect from the patient's hydrosalpinges. The left kidney is grossly unremarkable in appearance. Nonspecific perinephric stranding is noted bilaterally. No nonobstructing renal stones are identified. Prominent retroperitoneal nodes measure up to 1.2 cm in short axis. These are difficult to fully assess without contrast. No free fluid is identified. The small bowel is unremarkable in appearance. The stomach is within normal limits. No acute vascular abnormalities  are seen. Minimal vascular calcifications are seen. The appendix is normal in caliber, without evidence of appendicitis. The colon is grossly unremarkable in appearance. The bladder is mildly distended and grossly unremarkable. An apparent 5 cm fibroid is noted within the uterus. A likely exophytic fibroid is seen on the right. Prominent  bilateral tubular cystic structures are noted at the pelvis, which may reflect recurrent bilateral hydrosalpinges, left larger than right. The ovaries are not well assessed. No inguinal lymphadenopathy is seen. No acute osseous abnormalities are identified. IMPRESSION: 1. Prominent retroperitoneal nodes measure up to 1.2 cm in short axis. These are difficult to fully assess without contrast. Malignancy cannot be entirely excluded. Would correlate with the patient's history, and consider contrast-enhanced CT or PET/CT when and as deemed clinically appropriate. 2. Relatively severe chronic right-sided hydronephrosis, with diffuse dilatation of the right ureter. This may reflect mild mass-effect from the patient's apparent hydrosalpinges. No obstructing stone seen. 3. Prominent bilateral tubular cystic structures at the pelvis, which may reflect recurrent bilateral hydrosalpinges, left larger than right. This could be further assessed on pelvic ultrasound, or on the follow-up study mentioned above. 4. Uterine fibroids again noted. 5. Mild splenomegaly. Electronically Signed   By: Garald Balding M.D.   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 She now presents with leukocytosis and severe left pelvic pain concerning for superinfection of the left hydrosalpinx. She presents to interventional radiology for percutaneous drain placement. EXAM: CT GUIDED DRAINAGE OF  ABSCESS MEDICATIONS: The patient is currently admitted to the hospital and receiving intravenous antibiotics. The antibiotics were administered within an appropriate time frame prior to the initiation of the procedure. ANESTHESIA/SEDATION: 1 mg IV Versed 50 mcg IV Fentanyl Moderate Sedation Time:  20 The patient was continuously monitored during the procedure by the interventional radiology nurse under  my direct supervision. COMPLICATIONS: None immediate. Estimated blood loss: None TECHNIQUE: Informed written consent was obtained from the patient after a thorough discussion of the procedural risks, benefits and alternatives. All questions were addressed. A timeout was performed prior to the initiation of the procedure. PROCEDURE: A planning axial CT scan was performed. The large left pelvic low-attenuation fluid collection is successfully identified. A suitable skin entry site was selected and marked. The region was then sterilely prepped and draped in standard fashion using chlorhexidine skin prep. Local anesthesia was attained by infiltration with 1% lidocaine. A small dermatotomy was made. Under intermittent CT fluoroscopic guidance, an 18 gauge trocar needle was carefully advanced along a left trans gluteal /parasacral approach and into the fluid collection. Care was taken to avoid the adjacent rectum. An Amplatz wire was coiled within the fluid collection in the skin tract dilated to 10 [place]. A Cook 10 [place] all-purpose drainage catheter was then advanced over the wire. Approximately 200 mL of purulent fluid was successfully aspirated. A sample was sent for cultures. The tube was then secured in place with 0 Prolene suture and connected to JP bulb suction. Post drain placement axial CT imaging demonstrates a well-positioned drainage catheter with significantly reduced volume of the left hydrosalpinx. FINDINGS: Approximately 200 mL thick, purulent and foul smelling fluid in the left fallopian tube consistent with pyosalpinx. IMPRESSION: Technically successful placement of a left trans gluteal approach drainage catheter into the left-sided pyosalpinx. Aspiration yields 200 mL purulent fluid. A sample was sent for culture. Signed, Antonietta Jewel.  Laurence Ferrari, MD [facility] Radiology Electronically Signed   By: Jacqulynn Cadet M.D.   On: 09/05/2015 09:17    Micro  Results      Recent Results (from the past 240 hour(s))  Urine C&S     Status: Abnormal   Collection Time: 09/01/15 10:32 PM  Result Value Ref Range Status   Specimen Description URINE, CLEAN CATCH  Final   Special Requests NONE  Final   Culture MULTIPLE SPECIES PRESENT, SUGGEST RECOLLECTION (A)  Final   Report Status 09/03/2015 FINAL  Final  Aerobic / Anaerobic Culture     Status: None (Preliminary result)   Collection Time: 09/02/15  6:25 PM  Result Value Ref Range Status   Specimen Description ABSCESS LEFT FALLOPIAN TUBE  Final   Special Requests Normal  Final   Gram Stain   Final    ABUNDANT WBC PRESENT,BOTH PMN AND MONONUCLEAR ABUNDANT GRAM POSITIVE COCCOBACILLUS    Culture   Final    HOLDING FOR POSSIBLE ANAEROBE Performed at [facility]    Report Status PENDING  Incomplete  Culture, blood (Routine X 2) w Reflex to ID Panel     Status: None (Preliminary result)   Collection Time: 09/03/15  6:54 PM  Result Value Ref Range Status   Specimen Description BLOOD RIGHT HAND  Final   Special Requests BOTTLES DRAWN AEROBIC AND ANAEROBIC 5CC  Final   Culture   Final    NO GROWTH 3 DAYS Performed at [facility]    Report Status PENDING  Incomplete  Culture, blood (Routine X 2) w Reflex to ID Panel     Status: None (Preliminary result)   Collection Time: 09/03/15  6:54 PM  Result Value Ref Range Status   Specimen Description BLOOD RIGHT ANTECUBITAL  Final   Special Requests BOTTLES DRAWN AEROBIC AND ANAEROBIC 4CC  Final   Culture   Final    NO GROWTH 3 DAYS Performed at [facility]    Report Status PENDING  Incomplete  Surgical pcr screen     Status: None   Collection Time: 09/05/15 10:36 AM  Result Value Ref Range Status   MRSA, PCR NEGATIVE NEGATIVE Final   Staphylococcus aureus NEGATIVE NEGATIVE Final    Comment:        The Xpert SA Assay (FDA approved for NASAL specimens in patients over 104 years of age), is one component of a  comprehensive surveillance program.  Test performance has been validated by [facility] for patients greater than or equal to 78 year old. It is not intended to diagnose infection nor to guide or monitor treatment.        Today   Subjective    Amy Lowe today has no headache,no chest abdominal pain,no new weakness tingling or numbness, feels much better wants to go home today.     Objective   Blood pressure 128/66, pulse 66, temperature 98.3 F (36.8 C), temperature source Oral, resp. rate 20, height 5\' 4"  (1.626 m), weight 91.763 kg (202 lb 4.8 oz), last menstrual period 08/23/2015, SpO2 100 %.   Intake/Output Summary (Last 24 hours) at 09/07/15 0911 Last data filed at 09/07/15 0643  Gross per 24 hour  Intake    272 ml  Output     15 ml  Net    257 ml    Exam Awake Alert, Oriented x 3, No new F.N deficits, Normal affect [place].AT,PERRAL Supple Neck,No JVD, No cervical lymphadenopathy appriciated.  Symmetrical  Chest wall movement, Good air movement bilaterally, CTAB RRR,No Gallops,Rubs or new Murmurs, No Parasternal Heave +ve B.Sounds, Abd Soft, Non tender, No organomegaly appriciated, No rebound -guarding or rigidity, Left gluteal drain catheter in place No Cyanosis, Clubbing or edema, No new Rash or bruise   Data Review   CBC w Diff:  Lab Results  Component Value Date   WBC 12.7* 09/07/2015   HGB 7.5* 09/07/2015   HCT 25.0* 09/07/2015   PLT 407* 09/07/2015   LYMPHOPCT 14 09/01/2015   MONOPCT 14 09/01/2015   EOSPCT 0 09/01/2015   BASOPCT 0 09/01/2015    CMP:  Lab Results  Component Value Date   NA 135 09/06/2015   K 3.9 09/06/2015   CL 98* 09/06/2015   CO2 29 09/06/2015   BUN 12 09/06/2015   CREATININE 1.19* 09/06/2015   PROT 7.3 09/01/2010   ALBUMIN 4.3 09/01/2010   BILITOT 0.2* 09/01/2010   ALKPHOS 77 09/01/2010   AST 21 09/01/2010   ALT 14 09/01/2010  .   Total Time in preparing paper work, data evaluation and todays exam - 35  minutes  [name] M.D on 09/07/2015 at [facility]   Office  [phone]

## 2015-09-08 LAB — CULTURE, BLOOD (ROUTINE X 2)
Culture: NO GROWTH
Culture: NO GROWTH

## 2015-09-08 LAB — AEROBIC/ANAEROBIC CULTURE W GRAM STAIN (SURGICAL/DEEP WOUND)

## 2015-09-08 LAB — AEROBIC/ANAEROBIC CULTURE (SURGICAL/DEEP WOUND): SPECIAL REQUESTS: NORMAL

## 2015-09-09 ENCOUNTER — Other Ambulatory Visit: Payer: Self-pay | Admitting: Urology

## 2015-09-09 DIAGNOSIS — N7093 Salpingitis and oophoritis, unspecified: Secondary | ICD-10-CM

## 2015-09-10 ENCOUNTER — Other Ambulatory Visit: Payer: Self-pay | Admitting: Obstetrics & Gynecology

## 2015-09-10 ENCOUNTER — Ambulatory Visit
Admission: RE | Admit: 2015-09-10 | Discharge: 2015-09-10 | Disposition: A | Discharge status: Home / Self Care | Payer: BLUE CROSS/BLUE SHIELD | Source: Ambulatory Visit | Attending: Urology | Admitting: Urology

## 2015-09-10 ENCOUNTER — Ambulatory Visit
Admission: RE | Admit: 2015-09-10 | Discharge: 2015-09-10 | Disposition: A | Discharge status: Home / Self Care | Payer: BLUE CROSS/BLUE SHIELD | Source: Ambulatory Visit | Attending: General Surgery | Admitting: General Surgery

## 2015-09-10 ENCOUNTER — Ambulatory Visit: Admission: RE | Admit: 2015-09-10 | Payer: BLUE CROSS/BLUE SHIELD | Source: Ambulatory Visit

## 2015-09-10 DIAGNOSIS — N7093 Salpingitis and oophoritis, unspecified: Secondary | ICD-10-CM

## 2015-09-10 MED ORDER — IOPAMIDOL (ISOVUE-300) INJECTION 61%
100.0000 mL | Freq: Once | INTRAVENOUS | Status: AC | PRN
  Administered 2015-09-10: 100 mL via INTRAVENOUS

## 2015-09-10 NOTE — Progress Notes (Signed)
Patient ID: Amy Lowe, female   DOB: 1976-02-08, 40 y.o.   MRN: [id number]   Referring Physician(s): Loma Boston, in hospital Primary GYN: Nelva Bush  Chief Complaint: The patient is seen in follow up today s/p percutaneous drainage of pyosalpinx  History of present illness:  This is a 40 yo black female who was admitted to the hospital recently with pelvic pain.  She has a history of uterine fibroids but has refused a hysterectomy.  In the hospital she was found to have multi-loculated hydrosalpinx and right hydronephrosis secondary to large compressing fibroids and hydrosalpinx.  IR was asked to place a drain in the largest collection, which revealed purulent drainage.  She also underwent a cystoscopy for ureteral stent placement.  She was eventually discharged and follows up today for a repeat CT scan of her pelvis to evaluate her percutaneous drain.  She is having some pelvic pain, but just started her cycle.  She is still on abx therapy, but her Flagyl is making her sick.  She is having minimal serous drainage from her drain.  Past Medical History  Diagnosis Date  . Diabetes mellitus   . Anemia     Past Surgical History  Procedure Laterality Date  . Abdominal laparascopy    . Dilation and curettage of uterus    . Hernia repair    . Dnc    . Cystoscopy w/ ureteral stent placement Right 09/05/2015    Procedure: CYSTO WITH RIGHT RETROGRADE AND STENT PLACEMENT;  Surgeon: Irine Seal, MD;  Location: [facility];  Service: Urology;  Laterality: Right;    Allergies: Penicillins and Iodinated diagnostic agents  Medications: Prior to Admission medications   Medication Sig Start Date End Date Taking? Authorizing Provider  diphenhydrAMINE (BENADRYL) 25 MG tablet Take 25 mg by mouth every 6 (six) hours as needed. For allergy symptom relief    Historical Provider, MD  doxycycline (VIBRAMYCIN) 100 MG capsule Take 1 capsule (100 mg total) by mouth 2 (two) times daily. 09/07/15   [name], MD  ferrous sulfate 325 (65 FE) MG tablet Take 1 tablet (325 mg total) by mouth 3 (three) times daily with meals. 09/07/15   [name], MD  hydrocortisone (ANUSOL-HC) 2.5 % rectal cream Place rectally 3 (three) times daily. 09/07/15   [name], MD  insulin detemir (LEVEMIR) 100 UNIT/ML injection Inject 18 Units into the skin 2 (two) times daily.    Historical Provider, MD  insulin lispro (HUMALOG) 100 UNIT/ML injection Inject 8 Units into the skin 3 (three) times daily before meals.    Historical Provider, MD  megestrol (MEGACE) 20 MG tablet Take 1 tablet (20 mg total) by mouth 2 (two) times daily. 09/07/15   [name], MD  metroNIDAZOLE (FLAGYL) 500 MG tablet Take 1 tablet (500 mg total) by mouth 3 (three) times daily. 09/07/15   [name], MD  nystatin-triamcinolone (MYCOLOG II) cream Apply topically 2 (two) times daily. 09/07/15   [name], MD  oxyCODONE-acetaminophen (PERCOCET/ROXICET) 5-325 MG tablet Take 1 tablet by mouth every 4 (four) hours as needed. 09/07/15   [name], MD  polyethylene glycol (MIRALAX / GLYCOLAX) packet Take 17 g by mouth 2 (two) times daily. 09/07/15   [name], MD  tranexamic acid (LYSTEDA) 650 MG TABS tablet Take 1 tablet by mouth 3 (three) times daily. Take two tablets three times daily during the first five days of menses. 08/22/15   Historical Provider, MD  Family History  Problem Relation Age of Onset  . Lung cancer Father   . Diabetes Mellitus II Maternal Grandmother     Social History   Social History  . Marital Status: Married    Spouse Name: N/A  . Number of Children: N/A  . Years of Education: N/A   Social History Main Topics  . Smoking status: Current Some Day Smoker    Types: Cigarettes  . Smokeless tobacco: Not on file  . Alcohol Use: No  . Drug Use: No  . Sexual Activity: Yes   Other Topics Concern  . Not on file   Social History Narrative     Vital Signs: BP 135/73 mmHg  Pulse 70   Temp(Src) 98.1 F (36.7 C) (Oral)  SpO2 100%  LMP 09/09/2015  Physical Exam Gen: Pleasant WD, WN, black female who appears slightly uncomfortable. Abd: soft, tender in pelvis, drain site is clean.  Drain with minimal serous drainage.  Her drain was removed without any issues and a dry dressing was placed over the site. Imaging: Ct Pelvis W Contrast  09/10/2015  CLINICAL DATA:  Multiloculated recurrent pyosalpinx and status post percutaneous catheter drainage of dominant left pelvic infected collection on 09/02/2015. There is no further significant drainage from this catheter with only a minimal amount of serous fluid present since hospital discharge. CT is performed prior to potential drain removal. EXAM: CT PELVIS WITH CONTRAST TECHNIQUE: Multidetector CT imaging of the pelvis was performed using the standard protocol following the bolus administration of intravenous contrast. CONTRAST:  115mL ISOVUE-300 IOPAMIDOL (ISOVUE-300) INJECTION 61% COMPARISON:  09/02/2015 FINDINGS: Left-sided pelvic drainage catheter inserted from a left trans gluteal approach is visualized. This has been successful in draining the dominant infected left posterior pyosalpinx pocket. Multiple undrained areas of fluid remain present in the right adnexal region, posterior pelvis and just posterior to the left fundus of the uterus. None of these residual collections contain air. Largest diameter of a left-sided tubular fluid collection is approximately 6.7 cm. A right-sided ureteral stent remains in place extending just into the bladder. The right kidney is only partially visualized but the lower kidney shows resolution of previously noted hydronephrosis. Uterine enlargement and right-sided exophytic fibroid appear stable. No evidence of dilated bowel loops in the pelvis. IMPRESSION: Significant decrease in the dominant left posterior pyosalpinx after catheter drainage. Multiple undrained pockets of fluid and hydrosalpinx remain  present in the pelvis. Due to lack of any significant fluid drainage from the current drain, the drain will be removed. Please see separately dictated clinic note in Epic. Electronically Signed   By: Aletta Edouard M.D.   On: 09/10/2015 10:56    Labs:  CBC:  Recent Labs  09/04/15 0458 09/04/15 1626 09/05/15 0522 09/06/15 0453 09/07/15 0501  WBC 19.1*  --  18.4* 15.2* 12.7*  HGB 6.8* 8.8* 8.3* 7.8* 7.5*  HCT 22.9* 29.0* 27.1* 25.8* 25.0*  PLT 326  --  385 365 407*    COAGS:  Recent Labs  09/02/15 1050  INR 1.38    BMP:  Recent Labs  09/02/15 0933 09/03/15 0443 09/05/15 0522 09/06/15 0453  NA 133* 134* 132* 135  K 3.7 4.3 4.0 3.9  CL 97* 100* 96* 98*  CO2 25 26 27 29   GLUCOSE 171* 89 156* 132*  BUN 10 7 12 12   CALCIUM 8.7* 8.8* 8.7* 8.6*  CREATININE 1.11* 1.07* 1.09* 1.19*  GFRNONAA >60 >60 >60 56*  GFRAA >60 >60 >60 >60    LIVER FUNCTION  TESTS: No results for input(s): BILITOT, AST, ALT, ALKPHOS, PROT, ALBUMIN in the last 8760 hours.  Assessment:  1. Pyosalpinx, s/p percutaneous drain placement by Dr. Laurence Ferrari on 09-05-15  Patient's repeat CT scan today still shows some evidence of multi-loculated hydrosalpinx, likely chronic in nature.  However, they do appears overall improved.  The loculation that was drained appears much improved.  Given the minimal output, her drain was removed today in clinic.  We recommend continued antibiotic therapy as well as follow up with her GYN for further plans of care.  She can follow up with [place] on a PRN basis.  Signed: Henreitta Cea 09/10/2015, 10:59 AM   Please refer to Dr. Kathlene Cote attestation of this note for management and plan.

## 2015-09-23 ENCOUNTER — Other Ambulatory Visit (HOSPITAL_BASED_OUTPATIENT_CLINIC_OR_DEPARTMENT_OTHER): Payer: BLUE CROSS/BLUE SHIELD

## 2015-09-23 ENCOUNTER — Ambulatory Visit: Payer: BLUE CROSS/BLUE SHIELD | Attending: Gynecologic Oncology | Admitting: Gynecologic Oncology

## 2015-09-23 ENCOUNTER — Encounter: Payer: Self-pay | Admitting: Gynecologic Oncology

## 2015-09-23 ENCOUNTER — Telehealth: Payer: Self-pay | Admitting: *Deleted

## 2015-09-23 VITALS — BP 134/78 | HR 82 | Temp 98.5°F | Resp 18 | Ht 64.0 in | Wt 189.2 lb

## 2015-09-23 DIAGNOSIS — Z6832 Body mass index (BMI) 32.0-32.9, adult: Secondary | ICD-10-CM | POA: Insufficient documentation

## 2015-09-23 DIAGNOSIS — Z91041 Radiographic dye allergy status: Secondary | ICD-10-CM | POA: Diagnosis not present

## 2015-09-23 DIAGNOSIS — N739 Female pelvic inflammatory disease, unspecified: Secondary | ICD-10-CM | POA: Diagnosis not present

## 2015-09-23 DIAGNOSIS — E669 Obesity, unspecified: Secondary | ICD-10-CM | POA: Insufficient documentation

## 2015-09-23 DIAGNOSIS — F17210 Nicotine dependence, cigarettes, uncomplicated: Secondary | ICD-10-CM | POA: Diagnosis not present

## 2015-09-23 DIAGNOSIS — D62 Acute posthemorrhagic anemia: Secondary | ICD-10-CM | POA: Diagnosis not present

## 2015-09-23 DIAGNOSIS — Z880 Allergy status to penicillin: Secondary | ICD-10-CM | POA: Insufficient documentation

## 2015-09-23 DIAGNOSIS — Z801 Family history of malignant neoplasm of trachea, bronchus and lung: Secondary | ICD-10-CM | POA: Insufficient documentation

## 2015-09-23 DIAGNOSIS — E1129 Type 2 diabetes mellitus with other diabetic kidney complication: Secondary | ICD-10-CM

## 2015-09-23 DIAGNOSIS — N7093 Salpingitis and oophoritis, unspecified: Secondary | ICD-10-CM | POA: Insufficient documentation

## 2015-09-23 DIAGNOSIS — E119 Type 2 diabetes mellitus without complications: Secondary | ICD-10-CM | POA: Insufficient documentation

## 2015-09-23 DIAGNOSIS — D259 Leiomyoma of uterus, unspecified: Secondary | ICD-10-CM | POA: Insufficient documentation

## 2015-09-23 DIAGNOSIS — N920 Excessive and frequent menstruation with regular cycle: Secondary | ICD-10-CM | POA: Diagnosis not present

## 2015-09-23 DIAGNOSIS — Z833 Family history of diabetes mellitus: Secondary | ICD-10-CM | POA: Diagnosis not present

## 2015-09-23 DIAGNOSIS — Z794 Long term (current) use of insulin: Secondary | ICD-10-CM | POA: Diagnosis not present

## 2015-09-23 LAB — CBC WITH DIFFERENTIAL/PLATELET
BASO%: 0.8 % (ref 0.0–2.0)
BASOS ABS: 0.1 10*3/uL (ref 0.0–0.1)
EOS%: 1 % (ref 0.0–7.0)
Eosinophils Absolute: 0.1 10*3/uL (ref 0.0–0.5)
HCT: 31.3 % — ABNORMAL LOW (ref 34.8–46.6)
HEMOGLOBIN: 9.7 g/dL — AB (ref 11.6–15.9)
LYMPH#: 1.5 10*3/uL (ref 0.9–3.3)
LYMPH%: 17.9 % (ref 14.0–49.7)
MCH: 21.7 pg — AB (ref 25.1–34.0)
MCHC: 31 g/dL — ABNORMAL LOW (ref 31.5–36.0)
MCV: 70.1 fL — ABNORMAL LOW (ref 79.5–101.0)
MONO#: 0.3 10*3/uL (ref 0.1–0.9)
MONO%: 4 % (ref 0.0–14.0)
NEUT%: 76.3 % (ref 38.4–76.8)
NEUTROS ABS: 6.4 10*3/uL (ref 1.5–6.5)
Platelets: 350 10*3/uL (ref 145–400)
RBC: 4.47 10*6/uL (ref 3.70–5.45)
RDW: 34.3 % — AB (ref 11.2–14.5)
WBC: 8.3 10*3/uL (ref 3.9–10.3)

## 2015-09-23 LAB — TECHNOLOGIST REVIEW

## 2015-09-23 NOTE — Telephone Encounter (Signed)
Pt called and notified of scheduled appointment with Dr. Posey Pronto on 09/24/15 at 1:00. Pt agreed with time and date

## 2015-09-23 NOTE — Progress Notes (Signed)
Consult Note: Gyn-Onc  Consult was requested by Dr. Nelva Bush for the evaluation of Amy Lowe 40 y.o. female  CC:  Chief Complaint  Patient presents with  . Fibroids    New Consultation    Assessment/Plan:  Amy Lowe  is a 40 y.o.  year old with TOA's bilaterally, fibroid uterus, menorrhagia, bilateral hydroureter, poorly controlled diabetes mellitus, chronic pelvic pain.  I reviewed Amy Lowe's CT abdo/pelvis with her from her recent hospitalization to demonstrate to her the pathology of her pelvic viscera. I conversed with referring doctor, Dr Earleen Newport, about our treatment plan.  I discussed with Amy Lowe that I do not have a good non-surgical option for her. She has severely damaged pelvic organs (specifically tubes, ovaries and uterus) from severe TOA's. Additionally she is highly symptomatic from uterine hemorrhage associated with menorrhagia with recent presentation with a hemogobin of 3.8mg /dL.  I discussed that the only viable option for her would be to perform a complete hysterectomy and BSO. Based on her prior operative findings at laparoscopy and her recent TOA, and pelvic inflammation, in addition to my physical exam findings and the presence of bilateral hydroureter, her surgery is likely to be highly complex and require extensive retroperitoneal dissection. She has a high probability of requiring bowel resections and possibly ureteral reimplantation. I discussed that I felt this surgery would best be accomplished through a laparotomy approach. I discussed that postoperative recovery might include 1-2 weeks in the hospital and an extensive convalescence at home (6-8 weeks).   I discussed that she would be at risk for bowel or bladder or ureteral injury, and is at risk for additional complications such as pelvic or wound infection, major perioperative bleeding requiring blood transfusion, perioperative VTE and reoperation.  In order to minimize these major complications  for which she is at a higher risk due to her complex pathology ,recent active infection, poorly controlled diabetes and tobacco abuse, I am recommending she optimize her medical state preoperatively with the following strategies:   1/ appointment made with her endocrinologist, Dr Posey Pronto, to optimize blood glucose control. HbA1C checked today to assess her recent control. Patient states she had low blood glucose in house during recent stay because they were administering "all the insulin" she was prescribed, whereas at home she will frequently miss doses.  2/ optimize preoperative Hb by minimizing excessive menstruation. I recommend Depot Lupron for 3 months to cause fibroid involution and suppress ovulation and avoid menses preoperatively.  3/ Minimize likelihood of perioperative infection by allowing recent PID infection to "cool off" with additional time. This will facilitate diminution of pelvic inflammation.  4/ Follow-up with urology (Dr Jeffie Pollock) for potential stent exchange. Given the upcoming surgery ,it would be beneficial if these remained in situ as localizing ureteral stents.   I will see her back in 2 months to reassess her mind-set regarding hysterectomy (currently she is not entirely on board as she is devastated about the loss of fertility associated with this, though I explained fertility is not an option with her underlying pelvic pathology). If she is medically optimized in that timeframe, we will contemplate surgery (total abdominal hysterectomy, BSO, lysis of adhesions, possible bowel resection) in the later part of the summer, 2017.  HPI: Amy Lowe is a 40 year old G0 who is seen at the request of Dr. Nelva Bush for pelvic inflammatory disease, tubo-ovarian abscesses, menorrhagia, uterine fibroids, and multiple medical comorbidities. The patient has most recently been admitted to [facility]  in June 2017. She was admitted with tubo-ovarian abscess and sepsis and bilateral  hydronephrosis. She was treated with an INR directed trans-gluteal drain into the pelvic abscesses, IV antibiotics, bilateral ureteral stent placement, and blood transfusion for a critical hemoglobin of 3.8. This anemia was associated with an likely caused by heavy menorrhagia from uterine fibroids. As a result she was also started on Lysteda 650mg  2 tablets by mouth 3 times a day and Megace 40 mg by mouth twice a day. This helps slow down her bleeding somewhat. She was also transfused up from hemoglobin of 3.8 to a discharge hemoglobin of 7.5.  She is discharged home with a transgluteal drain in place but this has subsequently been removed due to clinical improvement and adequacy of drainage. Of note at the time of drain removal other pockets of undrained fluid and residual hydrosalpinx remained in the pelvis.  The patient's complex pelvic history began at age 40 when she was diagnosed with pelvic inflammatory disease and admitted to [facility] in [facility] where she was treated with what sounds like a conservative management with IV antibiotics only and no surgical intervention. She also has a history of menorrhagia for her entire menstrual life. However in recent years this is become substantially heavier. She also reports cyclic pain in the pelvis particularly around the time of menses. She is undergone several laparoscopies most recently in 2014. These were performed due to menorrhagia and pelvic pain. They revealed multiple adhesions between small and large intestine and grossly dilated tubes and ovaries bilaterally. Her symptoms have been managed as an outpatient up until this recent admission to [facility] in June 2017.  For several years she's been recommended to undergo hysterectomy due to her known tubo-ovarian abscesses, severe pelvic inflammatory disease, and symptomatic menorrhagia and fibroid uterus. She declined this due to strong desire for future fertility. She is also  extremely concerned about passing through menopause at an early age.  In addition to her complex gynecologic history the patient is also overweight with a BMI of 32.4 kg meters squared. She has a history of type 2 diabetes mellitus and for this she takes insulin. Her endocrinologist is Dr. Posey Pronto. She states that her most recent HbA1c was 6.8 but this may have been as long ago as January 2017. She denies taking all of her prescribed insulin medication and therefore during her recent hospitalization noticed hypoglycemic episodes and she was been prescribed and administered all of her insulin doses. She rarely checks her blood glucose at home despite her need for insulin use.  The patient has a history of tobacco use. She chronically takes narcotics for pelvic pain.    Current Meds:  Outpatient Encounter Prescriptions as of 09/23/2015  Medication Sig  . cyclobenzaprine (FLEXERIL) 10 MG tablet Take 10 mg by mouth.  . ferrous sulfate 325 (65 FE) MG tablet Take 1 tablet (325 mg total) by mouth 3 (three) times daily with meals.  . hydrocortisone (ANUSOL-HC) 2.5 % rectal cream Place rectally 3 (three) times daily.  . insulin detemir (LEVEMIR) 100 UNIT/ML injection Inject 18 Units into the skin 2 (two) times daily.  . insulin lispro (HUMALOG) 100 UNIT/ML injection Inject 8 Units into the skin 3 (three) times daily before meals.  . megestrol (MEGACE) 20 MG tablet Take 1 tablet (20 mg total) by mouth 2 (two) times daily. (Patient taking differently: Take 40 mg by mouth 2 (two) times daily. )  . nystatin-triamcinolone (MYCOLOG II) cream Apply topically 2 (two)  times daily.  Marland Kitchen oxyCODONE-acetaminophen (PERCOCET/ROXICET) 5-325 MG tablet Take 1 tablet by mouth every 4 (four) hours as needed.  . polyethylene glycol (MIRALAX / GLYCOLAX) packet Take 17 g by mouth 2 (two) times daily.  . tranexamic acid (LYSTEDA) 650 MG TABS tablet Take 1 tablet by mouth 3 (three) times daily. Take two tablets three times daily during  the first five days of menses.  . diphenhydrAMINE (BENADRYL) 25 MG tablet Take 25 mg by mouth every 6 (six) hours as needed. For allergy symptom relief  . [DISCONTINUED] doxycycline (VIBRAMYCIN) 100 MG capsule Take 1 capsule (100 mg total) by mouth 2 (two) times daily.  . [DISCONTINUED] metroNIDAZOLE (FLAGYL) 500 MG tablet Take 1 tablet (500 mg total) by mouth 3 (three) times daily.   No facility-administered encounter medications on file as of 09/23/2015.    Allergy:  Allergies  Allergen Reactions  . Penicillins Anaphylaxis    Has patient had a PCN reaction causing immediate rash, facial/tongue/throat swelling, SOB or lightheadedness with hypotension: No Has patient had a PCN reaction causing severe rash involving mucus membranes or skin necrosis: No Has patient had a PCN reaction that required hospitalization No Has patient had a PCN reaction occurring within the last 10 years: No If all of the above answers are "NO", then may proceed with Cephalosporin use.  . Iodinated Diagnostic Agents Other (See Comments)    "burning feeling"    Social Hx:   Social History   Social History  . Marital Status: Married    Spouse Name: N/A  . Number of Children: N/A  . Years of Education: N/A   Occupational History  . Not on file.   Social History Main Topics  . Smoking status: Current Some Day Smoker -- 0.25 packs/day    Types: Cigarettes  . Smokeless tobacco: Not on file  . Alcohol Use: No  . Drug Use: No  . Sexual Activity: Yes   Other Topics Concern  . Not on file   Social History Narrative    Past Surgical Hx:  Past Surgical History  Procedure Laterality Date  . Abdominal laparascopy    . Dilation and curettage of uterus    . Hernia repair    . Dnc    . Cystoscopy w/ ureteral stent placement Right 09/05/2015    Procedure: CYSTO WITH RIGHT RETROGRADE AND STENT PLACEMENT;  Surgeon: Irine Seal, MD;  Location: [facility];  Service: Urology;  Laterality: Right;    Past Medical Hx:   Past Medical History  Diagnosis Date  . Diabetes mellitus   . Anemia     Past Gynecological History:  PID diagnosed at age 31. Menorrhagia. Fibroids. Tubo-ovarian abscesses Patient's last menstrual period was 09/09/2015.  Family Hx:  Family History  Problem Relation Age of Onset  . Lung cancer Father   . Diabetes Mellitus II Maternal Grandmother     Review of Systems:  Constitutional  Feels well,    ENT Normal appearing ears and nares bilaterally Skin/Breast  No rash, sores, jaundice, itching, dryness Cardiovascular  No chest pain, shortness of breath, or edema  Pulmonary  No cough or wheeze.  Gastro Intestinal  No nausea, vomitting, or diarrhoea. No bright red blood per rectum, no abdominal pain, change in bowel movement, or constipation.  Genito Urinary  No frequency, urgency, dysuria, + menorrhagia, + pelvic pain Musculo Skeletal  No myalgia, arthralgia, joint swelling or pain  Neurologic  No weakness, numbness, change in gait,  Psychology  No depression, anxiety, insomnia.  Vitals:  Blood pressure 134/78, pulse 82, temperature 98.5 F (36.9 C), temperature source Oral, resp. rate 18, height 5\' 4"  (1.626 m), weight 189 lb 3.2 oz (85.821 kg), last menstrual period 09/09/2015, SpO2 100 %.  Physical Exam: WD in NAD Neck  Supple NROM, without any enlargements.  Lymph Node Survey No cervical supraclavicular or inguinal adenopathy Cardiovascular  Pulse normal rate, regularity and rhythm. S1 and S2 normal.  Lungs  Clear to auscultation bilateraly, without wheezes/crackles/rhonchi. Good air movement.  Skin  No rash/lesions/breakdown  Psychiatry  Alert and oriented to person, place, and time  Abdomen  Normoactive bowel sounds, abdomen soft, non-tender and overweight without evidence of hernia.  Back No CVA tenderness Genito Urinary  Vulva/vagina: Normal external female genitalia.  No lesions. No discharge or bleeding.  Bladder/urethra:  No lesions or masses,  well supported bladder  Vagina: normal  Cervix: Normal appearing, no lesions.  Uterus: The uterus is bulky but somewhat mobile. There is a densely adherent right adnexal mass that is immobile, and is cosistent with a right adnexa. On the left there is fullness but it is less discrete.  Adnexa: right adnexal masses. Rectal  Good tone, no masses no cul de sac nodularity.  Extremities  No bilateral cyanosis, clubbing or edema.   Donaciano Eva, MD  09/23/2015, 5:55 PM

## 2015-09-23 NOTE — Patient Instructions (Signed)
We will reach out to your OB GYN about arranging for you to have a lupron injection.  Plan to have a CBC and HgbAIC today.  Follow up with your endocrinologist, Dr. Posey Pronto, as soon as possible for optimization of your diabetes.  We will also reach out to Dr. Jeffie Pollock with Urology to get you an appointment to discuss stent changing.  Plan to follow up with Dr. Denman George in two months or sooner if needed.  Please call for any questions or concerns.

## 2015-09-24 LAB — HEMOGLOBIN A1C
ESTIMATED AVERAGE GLUCOSE: 134 mg/dL
HEMOGLOBIN A1C: 6.3 % — AB (ref 4.8–5.6)

## 2015-10-16 ENCOUNTER — Telehealth: Payer: Self-pay | Admitting: Nurse Practitioner

## 2015-10-16 NOTE — Telephone Encounter (Signed)
Per Joylene John, NP, this Rn called patient to explore reasons why she believes "lupron shot is not working." patient explains she is still cramping and having increased bleeding, this week she felt like she had a "full blown cycle." Pt is frustrated and does not know what else to do. Per Dr. Denman George, patient informed that lupron will take time to work and that the goal is to shrink the uterus. It was also explained that other factors, such as diabetes, needs to be optimized before moving forward with surgery. Patient was given the option to make an appointment with Dr. Denman George to discuss concerns and vaginal bleeding or make an apt with primary gyn provider. Patient prefers to see gyn and will call to make an apt. She will update [place] on her visit and status of her bleeding after the apt. Patient informed to call with any changes or new concerns; she verbalizes understanding.

## 2015-11-25 ENCOUNTER — Encounter: Payer: Self-pay | Admitting: Gynecologic Oncology

## 2015-11-25 ENCOUNTER — Ambulatory Visit: Payer: BLUE CROSS/BLUE SHIELD | Attending: Gynecologic Oncology | Admitting: Gynecologic Oncology

## 2015-11-25 ENCOUNTER — Other Ambulatory Visit (HOSPITAL_BASED_OUTPATIENT_CLINIC_OR_DEPARTMENT_OTHER): Payer: BLUE CROSS/BLUE SHIELD

## 2015-11-25 VITALS — BP 145/73 | HR 86 | Temp 98.9°F | Resp 18 | Ht 64.0 in | Wt 197.7 lb

## 2015-11-25 DIAGNOSIS — N730 Acute parametritis and pelvic cellulitis: Secondary | ICD-10-CM | POA: Insufficient documentation

## 2015-11-25 DIAGNOSIS — R102 Pelvic and perineal pain: Secondary | ICD-10-CM | POA: Diagnosis not present

## 2015-11-25 DIAGNOSIS — Z833 Family history of diabetes mellitus: Secondary | ICD-10-CM | POA: Diagnosis not present

## 2015-11-25 DIAGNOSIS — Z960 Presence of urogenital implants: Secondary | ICD-10-CM | POA: Diagnosis not present

## 2015-11-25 DIAGNOSIS — Z801 Family history of malignant neoplasm of trachea, bronchus and lung: Secondary | ICD-10-CM | POA: Diagnosis not present

## 2015-11-25 DIAGNOSIS — N7093 Salpingitis and oophoritis, unspecified: Secondary | ICD-10-CM

## 2015-11-25 DIAGNOSIS — G8929 Other chronic pain: Secondary | ICD-10-CM | POA: Insufficient documentation

## 2015-11-25 DIAGNOSIS — F17210 Nicotine dependence, cigarettes, uncomplicated: Secondary | ICD-10-CM | POA: Diagnosis not present

## 2015-11-25 DIAGNOSIS — E1165 Type 2 diabetes mellitus with hyperglycemia: Secondary | ICD-10-CM | POA: Diagnosis not present

## 2015-11-25 DIAGNOSIS — D500 Iron deficiency anemia secondary to blood loss (chronic): Secondary | ICD-10-CM

## 2015-11-25 DIAGNOSIS — N920 Excessive and frequent menstruation with regular cycle: Secondary | ICD-10-CM | POA: Diagnosis not present

## 2015-11-25 DIAGNOSIS — N134 Hydroureter: Secondary | ICD-10-CM | POA: Insufficient documentation

## 2015-11-25 DIAGNOSIS — E1129 Type 2 diabetes mellitus with other diabetic kidney complication: Secondary | ICD-10-CM

## 2015-11-25 DIAGNOSIS — Z794 Long term (current) use of insulin: Secondary | ICD-10-CM | POA: Insufficient documentation

## 2015-11-25 DIAGNOSIS — N135 Crossing vessel and stricture of ureter without hydronephrosis: Secondary | ICD-10-CM | POA: Diagnosis not present

## 2015-11-25 DIAGNOSIS — D259 Leiomyoma of uterus, unspecified: Secondary | ICD-10-CM | POA: Diagnosis not present

## 2015-11-25 LAB — HEMOGLOBIN A1C
ESTIMATED AVERAGE GLUCOSE: 154 mg/dL
Hemoglobin A1c: 7 % — ABNORMAL HIGH (ref 4.8–5.6)

## 2015-11-25 NOTE — Patient Instructions (Signed)
We will contact you with the results of your lab work from today.  You will also receive a phone call from Dr. Lynne Logan office with Urology to schedule an appointment to discuss surgery.  We will hold time at the end of September.  We will also set you up for a pre-op appointment with Dr. Denman George.

## 2015-11-25 NOTE — Progress Notes (Signed)
Follow-up Note: Gyn-Onc  Consult was initially requested by Dr. Nelva Bush for the evaluation of Amy Lowe 40 y.o. female  CC:  Chief Complaint  Patient presents with  . tubo-ovarian abcess    follow up visit    Assessment/Plan:  Amy Lowe  is a 40 y.o.  year old with TOA's bilaterally, fibroid uterus, menorrhagia, bilateral hydroureter, right distal ureteral obstruction, poorly controlled diabetes mellitus, chronic pelvic pain.  I am recommending hysterectomy, BSO and right ureteral re-implantation with stenting. She has had some, but minimal, response to depot lupron. Recommend an additional injection in late August, 2017. This will help with fibroid size and preoeperative bleeding/anemia.  I believe, based on my exam, that she may be a candidate for a minimally invasive procedure with robotic assistance. I will have the patient see Dr Dutch Gray from [facility] to discuss the possibility of him assisting me during the procedure for distal right ureteral resection and reimplantation.  She was informed that if pelvic disease or adhesive disease is too extensive to facilitate a minimally invasive approach she will require conversion to laparotomy which would involve a longer recovery.  I anticipate that she will require 6 weeks to recover from such an extensive procedure (even if performed robotically). The patient insists that she does not have this much time to recover and be off work. 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  1/ diabetes. appointment with her endocrinologist, Dr Posey Pronto, to optimize blood glucose control. HbA1C (recheck today). Adjustments made to regimen.  2/ blood loss anemia: optimize preoperative Hb by minimizing excessive menstruation. I recommend continuing Depot Lupron for an additional month to cause fibroid  involution and suppress ovulation and avoid menses preoperatively. Recheck Hb today. Plan for preop transfusion vs IV iron as needed (patient does not absorb oral iron and has failed this in the past).  3/  Follow-up with urology (Dr Alinda Money) for preoperative evaluation and potential stent exchange if there is additional delay in preparing for surgery.   I will see her back in 1 month to finalize surgical planning and to discuss the procedure at length.    HPI: Amy Lowe is a 40 year old G0 who is seen at the request of Dr. Nelva Bush for pelvic inflammatory disease, tubo-ovarian abscesses, menorrhagia, uterine fibroids, and multiple medical comorbidities. The patient has most recently been admitted to [facility] in June 2017. She was admitted with tubo-ovarian abscess and sepsis and bilateral hydronephrosis. She was treated with an INR directed trans-gluteal drain into the pelvic abscesses, IV antibiotics, bilateral ureteral stent placement, and blood transfusion for a critical hemoglobin of 3.8. This anemia was associated with an likely caused by heavy menorrhagia from uterine fibroids. As a result she was also started on Lysteda 650mg  2 tablets by mouth 3 times a day and Megace 40 mg by mouth twice a day. This helps slow down her bleeding somewhat. She was also transfused up from hemoglobin of 3.8 to a discharge hemoglobin of 7.5.  She is discharged home with a transgluteal drain in place but this has subsequently been removed due to clinical improvement and adequacy of drainage. Of note at the time of drain removal other pockets of undrained fluid and residual hydrosalpinx remained in the pelvis.  The patient's complex pelvic history began at age 21 when she was diagnosed  with pelvic inflammatory disease and admitted to [facility] in [facility] where she was treated with what sounds like a conservative management with IV antibiotics only and no surgical intervention. She also has a  history of menorrhagia for her entire menstrual life. However in recent years this is become substantially heavier. She also reports cyclic pain in the pelvis particularly around the time of menses. She is undergone several laparoscopies most recently in 2014. These were performed due to menorrhagia and pelvic pain. They revealed multiple adhesions between small and large intestine and grossly dilated tubes and ovaries bilaterally. Her symptoms have been managed as an outpatient up until this recent admission to [facility] in June 2017.  For several years she's been recommended to undergo hysterectomy due to her known tubo-ovarian abscesses, severe pelvic inflammatory disease, and symptomatic menorrhagia and fibroid uterus. She declined this due to strong desire for future fertility. She is also extremely concerned about passing through menopause at an early age.  In addition to her complex gynecologic history the patient is also overweight with a BMI of 32.4 kg meters squared. She has a history of type 2 diabetes mellitus and for this she takes insulin. Her endocrinologist is Dr. Posey Pronto. She states that her most recent HbA1c was 6.8 but this may have been as long ago as January 2017. She denies taking all of her prescribed insulin medication and therefore during her recent hospitalization noticed hypoglycemic episodes and she was been prescribed and administered all of her insulin doses. She rarely checks her blood glucose at home despite her need for insulin use.  The patient has a history of tobacco use. She chronically takes narcotics for pelvic pain.   Interval Hx:  She was started on Depot Lupron to suppress menses in June, 2017. This induced some decrease in bleeding, however she is still persistently bleeding daily. She has come to terms with the fact that she will require hysterectomy and that this will involve permanent loss of fertility. She has seen her endocrinologist, Dr Posey Pronto who  modify her diet and to take humalog at least twice daily as per sliding scale.   Current Meds:  Outpatient Encounter Prescriptions as of 11/25/2015  Medication Sig  . diphenhydrAMINE (BENADRYL) 25 MG tablet Take 25 mg by mouth every 6 (six) hours as needed. For allergy symptom relief  . insulin detemir (LEVEMIR) 100 UNIT/ML injection Inject 18 Units into the skin 2 (two) times daily.  . insulin lispro (HUMALOG) 100 UNIT/ML injection Inject 8 Units into the skin 3 (three) times daily before meals.  . megestrol (MEGACE) 20 MG tablet Take 1 tablet (20 mg total) by mouth 2 (two) times daily. (Patient taking differently: Take 40 mg by mouth 2 (two) times daily. )  . tranexamic acid (LYSTEDA) 650 MG TABS tablet Take 1 tablet by mouth 3 (three) times daily. Take two tablets three times daily during the first five days of menses.  . cyclobenzaprine (FLEXERIL) 10 MG tablet Take 10 mg by mouth.  . hydrocortisone (ANUSOL-HC) 2.5 % rectal cream Place rectally 3 (three) times daily. (Patient not taking: Reported on 11/25/2015)  . nystatin-triamcinolone (MYCOLOG II) cream Apply topically 2 (two) times daily. (Patient not taking: Reported on 11/25/2015)  . polyethylene glycol (MIRALAX / GLYCOLAX) packet Take 17 g by mouth 2 (two) times daily. (Patient not taking: Reported on 11/25/2015)  . [DISCONTINUED] ferrous sulfate 325 (65 FE) MG tablet Take 1 tablet (325 mg total) by mouth 3 (three) times daily with  meals. (Patient not taking: Reported on 11/25/2015)  . [DISCONTINUED] oxyCODONE-acetaminophen (PERCOCET/ROXICET) 5-325 MG tablet Take 1 tablet by mouth every 4 (four) hours as needed.   No facility-administered encounter medications on file as of 11/25/2015.     Allergy: PCN, iodinated contrast agents  Social Hx:   Social History   Social History  . Marital status: Married    Spouse name: N/A  . Number of children: N/A  . Years of education: N/A   Occupational History  . Not on file.   Social History  Main Topics  . Smoking status: Current Some Day Smoker    Packs/day: 0.25    Types: Cigarettes  . Smokeless tobacco: Never Used  . Alcohol use Yes     Comment: occ  . Drug use: No  . Sexual activity: Yes   Other Topics Concern  . Not on file   Social History Narrative  . No narrative on file    Past Surgical Hx:  Past Surgical History:  Procedure Laterality Date  . abdominal laparascopy    . CYSTOSCOPY W/ URETERAL STENT PLACEMENT Right 09/05/2015   Procedure: CYSTO WITH RIGHT RETROGRADE AND STENT PLACEMENT;  Surgeon: Irine Seal, MD;  Location: [facility];  Service: Urology;  Laterality: Right;  . DILATION AND CURETTAGE OF UTERUS    . DNC    . HERNIA REPAIR      Past Medical Hx:  Past Medical History:  Diagnosis Date  . Anemia   . Diabetes mellitus     Past Gynecological History:  PID diagnosed at age 74. Menorrhagia. Fibroids. Tubo-ovarian abscesses No LMP recorded.  Family Hx:  Family History  Problem Relation Age of Onset  . Lung cancer Father   . Diabetes Mellitus II Maternal Grandmother     Review of Systems:  Constitutional  Feels well,    ENT Normal appearing ears and nares bilaterally Skin/Breast  No rash, sores, jaundice, itching, dryness Cardiovascular  No chest pain, shortness of breath, or edema  Pulmonary  No cough or wheeze.  Gastro Intestinal  No nausea, vomitting, or diarrhoea. No bright red blood per rectum, no abdominal pain, change in bowel movement, or constipation.  Genito Urinary  No frequency, urgency, dysuria, + menorrhagia, + pelvic pain Musculo Skeletal  No myalgia, arthralgia, joint swelling or pain  Neurologic  No weakness, numbness, change in gait,  Psychology  No depression, anxiety, insomnia.   Vitals:  Blood pressure (!) 145/73, pulse 86, temperature 98.9 F (37.2 C), temperature source Oral, resp. rate 18, height 5\' 4"  (1.626 m), weight 197 lb 11.2 oz (89.7 kg), SpO2 100 %.  Physical Exam: WD in NAD Neck  Supple NROM,  without any enlargements.  Lymph Node Survey No cervical supraclavicular or inguinal adenopathy Cardiovascular  Pulse normal rate, regularity and rhythm. S1 and S2 normal.  Lungs  Clear to auscultation bilateraly, without wheezes/crackles/rhonchi. Good air movement.  Skin  No rash/lesions/breakdown  Psychiatry  Alert and oriented to person, place, and time  Abdomen  Normoactive bowel sounds, abdomen soft, non-tender and overweight without evidence of hernia.  Back No CVA tenderness Genito Urinary  Vulva/vagina: Normal external female genitalia.  No lesions. No discharge or bleeding.  Bladder/urethra:  No lesions or masses, well supported bladder  Vagina: normal  Cervix: Normal appearing, no lesions.  Uterus: The uterus is bulky but somewhat mobile. There is a densely adherent right adnexal mass that is immobile, and is cosistent with a right adnexa. On the left there is fullness but  it is less discrete.  Adnexa: right adnexal masses. Rectal  Good tone, no masses no cul de sac nodularity.  Extremities  No bilateral cyanosis, clubbing or edema.   Donaciano Eva, MD  11/25/2015, 3:26 PM

## 2015-11-27 ENCOUNTER — Telehealth: Payer: Self-pay

## 2015-11-27 NOTE — Telephone Encounter (Signed)
Orders received from [facility], APNP to contat the patient with her A1c level 7.0 . Patient did not have a CBC drawn and the patient may come back to have it drawn or she wait until she has an appointment with Dr Alinda Money. Attempted to contact the patient , no answer , left a detailed message with call back information provided if she chooses to have the CBC drawn here .

## 2015-12-06 ENCOUNTER — Other Ambulatory Visit: Payer: Self-pay | Admitting: Urology

## 2015-12-06 NOTE — Progress Notes (Signed)
Pt is being scheduled for preop appt. Please place surgical orders in epic. Thanks.

## 2015-12-25 ENCOUNTER — Encounter: Payer: Self-pay | Admitting: Gynecologic Oncology

## 2015-12-25 ENCOUNTER — Ambulatory Visit (HOSPITAL_COMMUNITY)
Admission: RE | Admit: 2015-12-25 | Discharge: 2015-12-25 | Disposition: A | Discharge status: Home / Self Care | Payer: BLUE CROSS/BLUE SHIELD | Source: Ambulatory Visit | Attending: Gynecologic Oncology | Admitting: Gynecologic Oncology

## 2015-12-25 ENCOUNTER — Ambulatory Visit: Payer: BLUE CROSS/BLUE SHIELD | Attending: Gynecologic Oncology | Admitting: Gynecologic Oncology

## 2015-12-25 ENCOUNTER — Telehealth: Payer: Self-pay | Admitting: Gynecologic Oncology

## 2015-12-25 ENCOUNTER — Other Ambulatory Visit (HOSPITAL_BASED_OUTPATIENT_CLINIC_OR_DEPARTMENT_OTHER): Payer: BLUE CROSS/BLUE SHIELD

## 2015-12-25 ENCOUNTER — Other Ambulatory Visit: Payer: Self-pay

## 2015-12-25 ENCOUNTER — Other Ambulatory Visit: Payer: Self-pay | Admitting: Gynecologic Oncology

## 2015-12-25 VITALS — BP 148/66 | HR 109 | Temp 99.4°F | Resp 19 | Wt 198.6 lb

## 2015-12-25 DIAGNOSIS — N730 Acute parametritis and pelvic cellulitis: Secondary | ICD-10-CM

## 2015-12-25 DIAGNOSIS — D500 Iron deficiency anemia secondary to blood loss (chronic): Secondary | ICD-10-CM | POA: Insufficient documentation

## 2015-12-25 DIAGNOSIS — N7093 Salpingitis and oophoritis, unspecified: Secondary | ICD-10-CM | POA: Diagnosis not present

## 2015-12-25 DIAGNOSIS — E1165 Type 2 diabetes mellitus with hyperglycemia: Secondary | ICD-10-CM | POA: Diagnosis not present

## 2015-12-25 DIAGNOSIS — D259 Leiomyoma of uterus, unspecified: Secondary | ICD-10-CM | POA: Diagnosis not present

## 2015-12-25 DIAGNOSIS — R102 Pelvic and perineal pain: Secondary | ICD-10-CM | POA: Insufficient documentation

## 2015-12-25 DIAGNOSIS — Z6832 Body mass index (BMI) 32.0-32.9, adult: Secondary | ICD-10-CM | POA: Insufficient documentation

## 2015-12-25 DIAGNOSIS — E663 Overweight: Secondary | ICD-10-CM | POA: Diagnosis not present

## 2015-12-25 DIAGNOSIS — N1330 Unspecified hydronephrosis: Secondary | ICD-10-CM

## 2015-12-25 DIAGNOSIS — Z794 Long term (current) use of insulin: Secondary | ICD-10-CM | POA: Diagnosis not present

## 2015-12-25 DIAGNOSIS — N134 Hydroureter: Secondary | ICD-10-CM | POA: Diagnosis not present

## 2015-12-25 DIAGNOSIS — G8929 Other chronic pain: Secondary | ICD-10-CM | POA: Insufficient documentation

## 2015-12-25 DIAGNOSIS — F17210 Nicotine dependence, cigarettes, uncomplicated: Secondary | ICD-10-CM | POA: Insufficient documentation

## 2015-12-25 DIAGNOSIS — N920 Excessive and frequent menstruation with regular cycle: Secondary | ICD-10-CM | POA: Insufficient documentation

## 2015-12-25 DIAGNOSIS — E119 Type 2 diabetes mellitus without complications: Secondary | ICD-10-CM | POA: Diagnosis not present

## 2015-12-25 DIAGNOSIS — N135 Crossing vessel and stricture of ureter without hydronephrosis: Secondary | ICD-10-CM | POA: Diagnosis not present

## 2015-12-25 LAB — TECHNOLOGIST REVIEW

## 2015-12-25 LAB — CBC WITH DIFFERENTIAL/PLATELET
BASO%: 0.7 % (ref 0.0–2.0)
BASOS ABS: 0 10*3/uL (ref 0.0–0.1)
EOS ABS: 0 10*3/uL (ref 0.0–0.5)
EOS%: 0.7 % (ref 0.0–7.0)
HCT: 18.8 % — ABNORMAL LOW (ref 34.8–46.6)
HEMOGLOBIN: 5.4 g/dL — AB (ref 11.6–15.9)
LYMPH%: 53.9 % — ABNORMAL HIGH (ref 14.0–49.7)
MCH: 17.9 pg — AB (ref 25.1–34.0)
MCHC: 28.7 g/dL — ABNORMAL LOW (ref 31.5–36.0)
MCV: 62.3 fL — AB (ref 79.5–101.0)
MONO#: 0.2 10*3/uL (ref 0.1–0.9)
MONO%: 6.6 % (ref 0.0–14.0)
NEUT#: 1.2 10*3/uL — ABNORMAL LOW (ref 1.5–6.5)
NEUT%: 38.1 % — AB (ref 38.4–76.8)
NRBC: 0 % (ref 0–0)
RBC: 3.02 10*6/uL — ABNORMAL LOW (ref 3.70–5.45)
RDW: 18.6 % — ABNORMAL HIGH (ref 11.2–14.5)
WBC: 3 10*3/uL — AB (ref 3.9–10.3)
lymph#: 1.6 10*3/uL (ref 0.9–3.3)

## 2015-12-25 NOTE — Patient Instructions (Signed)
Plan to have a CBC and Hemoglobin A1C drawn today.  We will contact you with the results.  Plan for surgery next week.  Please call for any questions or concerns.                Preparing for your Surgery  Plan for surgery on September 28 with Dr. Everitt Amber and Dr. Dutch Gray.  You will be scheduled for a Robotic assisted total laparoscopic hysterectomy, bilateral salpingo-oophorectomy, CYSTOSCOPY, RIGHT URETERAL STENT CHANGE, POSSIBLE RIGHT URETERAL REIMPLANTATION.   Pre-operative Testing -You will receive a phone call from presurgical testing at [facility] to arrange for a pre-operative testing appointment before your surgery.  This appointment normally occurs one to two weeks before your scheduled surgery.   -Bring your insurance card, copy of an advanced directive if applicable, medication list  -At that visit, you will be asked to sign a consent for a possible blood transfusion in case a transfusion becomes necessary during surgery.  The need for a blood transfusion is rare but having consent is a necessary part of your care.     -You should not be taking blood thinners or aspirin at least ten days prior to surgery unless instructed by your surgeon.  Day Before Surgery at [place] will be asked to take in a light diet the day before surgery.  Avoid carbonated beverages.  You will be advised to have nothing to eat or drink after midnight the evening before.     Eat a light diet the day before surgery.  Examples including soups, broths, toast, yogurt, mashed potatoes.  Things to avoid include carbonated beverages (fizzy beverages), raw fruits and raw vegetables, or beans.    If your bowels are filled with gas, your surgeon will have difficulty visualizing your pelvic organs which increases your surgical risks.  Plan to drink two bottles of magnesium citrate the day before surgery starting at 9 am.    Your role in recovery Your role is to become active as soon as directed by  your doctor, while still giving yourself time to heal.  Rest when you feel tired. You will be asked to do the following in order to speed your recovery:  - Cough and breathe deeply. This helps toclear and expand your lungs and can prevent pneumonia. You may be given a spirometer to practice deep breathing. A staff member will show you how to use the spirometer. - Do mild physical activity. Walking or moving your legs help your circulation and body functions return to normal. A staff member will help you when you try to walk and will provide you with simple exercises. Do not try to get up or walk alone the first time. - Actively manage your pain. Managing your pain lets you move in comfort. We will ask you to rate your pain on a scale of zero to 10. It is your responsibility to tell your doctor or nurse where and how much you hurt so your pain can be treated.  Special Considerations -If you are diabetic, you may be placed on insulin after surgery to have closer control over your blood sugars to promote healing and recovery.  This does not mean that you will be discharged on insulin.  If applicable, your oral antidiabetics will be resumed when you are tolerating a solid diet.  -Your final pathology results from surgery should be available by the Friday after surgery and the results will be relayed to you when available.  Blood Transfusion Information WHAT IS A BLOOD TRANSFUSION? A transfusion is the replacement of blood or some of its parts. Blood is made up of multiple cells which provide different functions.  Red blood cells carry oxygen and are used for blood loss replacement.  Skipper blood cells fight against infection.  Platelets control bleeding.  Plasma helps clot blood.  Other blood products are available for specialized needs, such as hemophilia or other clotting disorders. BEFORE THE TRANSFUSION  Who gives blood for transfusions?   You may be able to donate blood to be used at a  later date on yourself (autologous donation).  Relatives can be asked to donate blood. This is generally not any safer than if you have received blood from a stranger. The same precautions are taken to ensure safety when a relative's blood is donated.  Healthy volunteers who are fully evaluated to make sure their blood is safe. This is blood bank blood. Transfusion therapy is the safest it has ever been in the practice of medicine. Before blood is taken from a donor, a complete history is taken to make sure that person has no history of diseases nor engages in risky social behavior (examples are intravenous drug use or sexual activity with multiple partners). The donor's travel history is screened to minimize risk of transmitting infections, such as malaria. The donated blood is tested for signs of infectious diseases, such as HIV and hepatitis. The blood is then tested to be sure it is compatible with you in order to minimize the chance of a transfusion reaction. If you or a relative donates blood, this is often done in anticipation of surgery and is not appropriate for emergency situations. It takes many days to process the donated blood. RISKS AND COMPLICATIONS Although transfusion therapy is very safe and saves many lives, the main dangers of transfusion include:   Getting an infectious disease.  Developing a transfusion reaction. This is an allergic reaction to something in the blood you were given. Every precaution is taken to prevent this. The decision to have a blood transfusion has been considered carefully by your caregiver before blood is given. Blood is not given unless the benefits outweigh the risks.

## 2015-12-25 NOTE — Telephone Encounter (Signed)
Patient informed of CBC results and Dr. Serita Grit recommendations for 3 units of PRBCs this Friday if possible.  Patient stating she can only take off one day a week and today is her day off.  Stating she may be able to do it Monday am but she would need to be at work by 3 pm.  [name] with [name] at the [facility] and appt made for the patient to come in at 8 am on Monday for a type and cross and transfusion of 2 units after that.  2 units will be given at this time because the patient has to get to work.  Dr. Denman George is aware and may give another unit possibly on the day of surgery if warranted.

## 2015-12-25 NOTE — Progress Notes (Signed)
Follow-up Note: Gyn-Onc  Consult was initially requested by Dr. Jill Wagner for the evaluation of Amy Lowe 40 y.o. female  CC:  Chief Complaint  Patient presents with  . tubo-ovarian abscesses  . symptomatic fibroid uterus  . right hydroureter    Assessment/Plan:  [name]  is a 40 y.o.  year old with TOA's bilaterally, fibroid uterus, menorrhagia, bilateral hydroureter, right distal ureteral obstruction, poorly controlled diabetes mellitus, chronic pelvic pain.  I am recommending hysterectomy, BSO and right ureteral re-implantation with stenting. She has had some, but minimal, response to depot lupron. Recommend an additional injection in late August, 2017. This will help with fibroid size and preoeperative bleeding/anemia.  I believe, based on my exam, that she may be a candidate for a minimally invasive procedure with robotic assistance. I will perform the surgery with Dr Les Borden from [facility] to assist me during the procedure for distal right ureteral resection and reimplantation.  She was informed that if pelvic disease or adhesive disease is too extensive to facilitate a minimally invasive approach she will require conversion to laparotomy which would involve a longer recovery.  I anticipate that she will require 6 weeks to recover from such an extensive procedure (even if performed robotically). The patient insists that she does not have this much time to recover and be off work. 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  I discussed surgical risks extensively with [place] including  bleeding, infection, damage to internal organs (such as bladder,ureters, bowels), blood clot, reoperation and rehospitalization. I discussed that her adhesive disease from TOA's would be associated with increased risk of damage to visceral organs,  and potentially the need for GI resection and reanastomosis. Rarely colostomy is required.   1/ diabetes. appointment with her endocrinologist, Dr Patel, to optimize blood glucose control. HbA1C (recheck today). Adjustments made to regimen.  2/ blood loss anemia: optimize preoperative Hb by minimizing excessive menstruation. She has been on Depot Lupron for 3 months to cause fibroid involution and suppress ovulation and avoid menses preoperatively. Hb 5.6 today (patient is hemodynamically stable and has adjusted to this chronic severe anemia). Plan for preop transfusion with 2 units of PRBC and type and cross for possible additional units intraop given her severe anemia.  3/  Surgery scheduled for 01/02/16  HPI: Amy Lowe is a 40 year old G0 who is seen at the request of Dr. Jill Wagner for pelvic inflammatory disease, tubo-ovarian abscesses, menorrhagia, uterine fibroids, and multiple medical comorbidities. The patient has most recently been admitted to [facility] in June 2017. She was admitted with tubo-ovarian abscess and sepsis and bilateral hydronephrosis. She was treated with an INR directed trans-gluteal drain into the pelvic abscesses, IV antibiotics, bilateral ureteral stent placement, and blood transfusion for a critical hemoglobin of 3.8. This anemia was associated with an likely caused by heavy menorrhagia from uterine fibroids. As a result she was also started on Lysteda 650mg 2 tablets by mouth 3 times a day and Megace 40 mg by mouth twice a day. This helps slow down her bleeding somewhat. She was also transfused up from hemoglobin of 3.8 to a discharge hemoglobin of 7.5.  She is discharged home with a transgluteal drain in place but this has subsequently been removed due to clinical improvement and adequacy of drainage. Of note at the time of   drain removal other pockets of undrained fluid and residual hydrosalpinx remained in the pelvis.  The patient's complex pelvic history  began at age 14 when she was diagnosed with pelvic inflammatory disease and [facility] [place] where she was treated with what sounds like a conservative management with IV antibiotics only and no surgical intervention. She also has a history of menorrhagia for her entire menstrual life. However in recent years this is become substantially heavier. She also reports cyclic pain in the pelvis particularly around the time of menses. She is undergone several laparoscopies most recently in 2014. These were performed due to menorrhagia and pelvic pain. They revealed multiple adhesions between small and large intestine and grossly dilated tubes and ovaries bilaterally. Her symptoms have been managed as an outpatient up until this recent admission to [facility] in June 2017.  For several years she's been recommended to undergo hysterectomy due to her known tubo-ovarian abscesses, severe pelvic inflammatory disease, and symptomatic menorrhagia and fibroid uterus. She declined this due to strong desire for future fertility. She is also extremely concerned about passing through menopause at an early age.  In addition to her complex gynecologic history the patient is also overweight with a BMI of 32.4 kg meters squared. She has a history of type 2 diabetes mellitus and for this she takes insulin. Her endocrinologist is Dr. Patel. She states that her most recent HbA1c was 6.8 but this may have been as long ago as January 2017. She denies taking all of her prescribed insulin medication and therefore during her recent hospitalization noticed hypoglycemic episodes and she was been prescribed and administered all of her insulin doses. She rarely checks her blood glucose at home despite her need for insulin use.  The patient has a history of tobacco use. She chronically takes narcotics for pelvic pain.   Interval Hx:  She had one heavy period in the past month which had large clots. She  has felt very fatigued since that time.  She was started on Depot Lupron to suppress menses in June, 2017. This induced some decrease in bleeding, however she is still persistently bleeding daily. She has come to terms with the fact that she will require hysterectomy and that this will involve permanent loss of fertility. She has seen her endocrinologist, Dr Patel who modify her diet and to take humalog at least twice daily as per sliding scale. She has met with Dr Borden and discussed right ureteral resection and reimplantation.   Current Meds:  Outpatient Encounter Prescriptions as of 12/25/2015  Medication Sig  . cyclobenzaprine (FLEXERIL) 10 MG tablet Take 10 mg by mouth.  . diphenhydrAMINE (BENADRYL) 25 MG tablet Take 25 mg by mouth every 6 (six) hours as needed. For allergy symptom relief  . hydrocortisone (ANUSOL-HC) 2.5 % rectal cream Place rectally 3 (three) times daily. (Patient not taking: Reported on 11/25/2015)  . insulin detemir (LEVEMIR) 100 UNIT/ML injection Inject 18 Units into the skin 2 (two) times daily.  . insulin lispro (HUMALOG) 100 UNIT/ML injection Inject 8 Units into the skin 3 (three) times daily before meals.  . megestrol (MEGACE) 20 MG tablet Take 1 tablet (20 mg total) by mouth 2 (two) times daily. (Patient taking differently: Take 40 mg by mouth 2 (two) times daily. )  . nystatin-triamcinolone (MYCOLOG II) cream Apply topically 2 (two) times daily. (Patient not taking: Reported on 11/25/2015)  . polyethylene glycol (MIRALAX / GLYCOLAX) packet Take 17 g by mouth 2 (  two) times daily. (Patient not taking: Reported on 11/25/2015)  . tranexamic acid (LYSTEDA) 650 MG TABS tablet Take 1 tablet by mouth 3 (three) times daily. Take two tablets three times daily during the first five days of menses.   No facility-administered encounter medications on file as of 12/25/2015.     Allergy: PCN, iodinated contrast agents  Social Hx:   Social History   Social History  . Marital  status: Married    Spouse name: N/A  . Number of children: N/A  . Years of education: N/A   Occupational History  . Not on file.   Social History Main Topics  . Smoking status: Current Some Day Smoker    Packs/day: 0.25    Types: Cigarettes  . Smokeless tobacco: Never Used  . Alcohol use Yes     Comment: occ  . Drug use: No  . Sexual activity: Yes   Other Topics Concern  . Not on file   Social History Narrative  . No narrative on file    Past Surgical Hx:  Past Surgical History:  Procedure Laterality Date  . abdominal laparascopy    . CYSTOSCOPY W/ URETERAL STENT PLACEMENT Right 09/05/2015   Procedure: CYSTO WITH RIGHT RETROGRADE AND STENT PLACEMENT;  Surgeon: John Wrenn, MD;  Location: [facility];  Service: Urology;  Laterality: Right;  . DILATION AND CURETTAGE OF UTERUS    . DNC    . HERNIA REPAIR      Past Medical Hx:  Past Medical History:  Diagnosis Date  . Anemia   . Diabetes mellitus     Past Gynecological History:  PID diagnosed at age 14. Menorrhagia. Fibroids. Tubo-ovarian abscesses No LMP recorded.  Family Hx:  Family History  Problem Relation Age of Onset  . Lung cancer Father   . Diabetes Mellitus II Maternal Grandmother     Review of Systems:  Constitutional  Feels well,    ENT Normal appearing ears and nares bilaterally Skin/Breast  No rash, sores, jaundice, itching, dryness Cardiovascular  No chest pain, shortness of breath, or edema  Pulmonary  No cough or wheeze.  Gastro Intestinal  No nausea, vomitting, or diarrhoea. No bright red blood per rectum, no abdominal pain, change in bowel movement, or constipation.  Genito Urinary  No frequency, urgency, dysuria, + menorrhagia, + pelvic pain Musculo Skeletal  No myalgia, arthralgia, joint swelling or pain  Neurologic  No weakness, numbness, change in gait,  Psychology  No depression, anxiety, insomnia.   Vitals:  There were no vitals taken for this visit.  Physical Exam: WD in  NAD Neck  Supple NROM, without any enlargements.  Lymph Node Survey No cervical supraclavicular or inguinal adenopathy Cardiovascular  Pulse normal rate, regularity and rhythm. S1 and S2 normal.  Lungs  Clear to auscultation bilateraly, without wheezes/crackles/rhonchi. Good air movement.  Skin  No rash/lesions/breakdown  Psychiatry  Alert and oriented to person, place, and time  Abdomen  Normoactive bowel sounds, abdomen soft, non-tender and overweight without evidence of hernia.  Back No CVA tenderness Genito Urinary  Vulva/vagina: Normal external female genitalia.  No lesions. No discharge or bleeding.  Bladder/urethra:  No lesions or masses, well supported bladder  Vagina: normal  Cervix: Normal appearing, no lesions.  Uterus: The uterus is bulky but somewhat mobile. There is a densely adherent right adnexal mass that is immobile, and is cosistent with a right adnexa. On the left there is fullness but it is less discrete.  Adnexa: right adnexal masses. Rectal    Good tone, no masses no cul de sac nodularity.  Extremities  No bilateral cyanosis, clubbing or edema.   Rodger Giangregorio Caroline, MD  12/25/2015, 2:45 PM  

## 2015-12-26 ENCOUNTER — Telehealth: Payer: Self-pay

## 2015-12-26 ENCOUNTER — Other Ambulatory Visit: Payer: Self-pay | Admitting: Gynecologic Oncology

## 2015-12-26 DIAGNOSIS — D500 Iron deficiency anemia secondary to blood loss (chronic): Secondary | ICD-10-CM

## 2015-12-26 LAB — HEMOGLOBIN A1C
Est. average glucose Bld gHb Est-mCnc: 163 mg/dL
Hemoglobin A1c: 7.3 % — ABNORMAL HIGH (ref 4.8–5.6)

## 2015-12-26 NOTE — Telephone Encounter (Signed)
Orders received from [facility] to contact the patient to update with A1c (7.3) results . Patient contacted and updated , patient sates undersyanding, denies further questions at this time.

## 2015-12-27 NOTE — Progress Notes (Signed)
HEMAGLOBIN A1C 12-25-15 EPIC EKG 09-02-15 EPIC

## 2015-12-27 NOTE — Patient Instructions (Addendum)
Amy Lowe  12/27/2015   Your procedure is scheduled on: 01-02-16  Report to [facility]  Entrance take [facility]  elevators to 3rd floor to  [place] at 1000  AM.  Call this number if you have problems the morning of surgery [phone]   Remember: ONLY 1 PERSON MAY GO WITH YOU TO SHORT STAY TO GET  READY MORNING OF [name]  Do not eat food or drink liquids :After Midnight.  Light diet day before surgery see examples below   Take these medicines the morning of surgery with A SIP OF WATER: FLEXERIL IF NEEDED, TAKE 1/2 DOSE OF LEVEMIR INSULIN 01-01-16 PM DOSE DO NOT TAKE ANY DIABETIC MEDICATIONS DAY OF YOUR SURGERY                               You may not have any metal on your body including hair pins and              piercings  Do not wear jewelry, make-up, lotions, powders or perfumes, deodorant             Do not wear nail polish.  Do not shave  48 hours prior to surgery.              Men may shave face and neck.   Do not bring valuables to the hospital. [place].  Contacts, dentures or bridgework may not be worn into surgery.  Leave suitcase in the car. After surgery it may be brought to your room.   _____________________________________________________________________             Eat a light diet the day before surgery.  Examples including soups, broths, toast, yogurt, mashed potatoes.  Things to avoid include carbonated beverages (fizzy beverages), raw fruits and raw vegetables, or beans.   If your bowels are filled with gas, your surgeon will have difficulty visualizing your pelvic organs which increases your surgical risks.[place] - Preparing for Surgery Before surgery, you can play an important role.  Because skin is not sterile, your skin needs to be as free of germs as possible.  You can reduce the number of germs on your skin by washing with CHG (chlorahexidine  gluconate) soap before surgery.  CHG is an antiseptic cleaner which kills germs and bonds with the skin to continue killing germs even after washing. Please DO NOT use if you have an allergy to CHG or antibacterial soaps.  If your skin becomes reddened/irritated stop using the CHG and inform your nurse when you arrive at Short Stay. Do not shave (including legs and underarms) for at least 48 hours prior to the first CHG shower.  You may shave your face/neck. Please follow these instructions carefully:  1.  Shower with CHG Soap the night before surgery and the  morning of Surgery.  2.  If you choose to wash your hair, wash your hair first as usual with your  normal  shampoo.  3.  After you shampoo, rinse your hair and body thoroughly to remove the  shampoo.  4.  Use CHG as you would any other liquid soap.  You can apply chg directly  to the skin and wash                       Gently with a scrungie or clean washcloth.  5.  Apply the CHG Soap to your body ONLY FROM THE NECK DOWN.   Do not use on face/ open                           Wound or open sores. Avoid contact with eyes, ears mouth and genitals (private parts).                       Wash face,  Genitals (private parts) with your normal soap.             6.  Wash thoroughly, paying special attention to the area where your surgery  will be performed.  7.  Thoroughly rinse your body with warm water from the neck down.  8.  DO NOT shower/wash with your normal soap after using and rinsing off  the CHG Soap.                9.  Pat yourself dry with a clean towel.            10.  Wear clean pajamas.            11.  Place clean sheets on your bed the night of your first shower and do not  sleep with pets. Day of Surgery : Do not apply any lotions/deodorants the morning of surgery.  Please wear clean clothes to the hospital/surgery center.  FAILURE TO FOLLOW THESE INSTRUCTIONS MAY RESULT IN THE CANCELLATION OF YOUR  SURGERY PATIENT SIGNATURE_________________________________  NURSE SIGNATURE__________________________________  ________________________________________________________________________   Amy Lowe  An incentive spirometer is a tool that can help keep your lungs clear and active. This tool measures how well you are filling your lungs with each breath. Taking long deep breaths may help reverse or decrease the chance of developing breathing (pulmonary) problems (especially infection) following:  A long period of time when you are unable to move or be active. BEFORE THE PROCEDURE   If the spirometer includes an indicator to show your best effort, your nurse or respiratory therapist will set it to a desired goal.  If possible, sit up straight or lean slightly forward. Try not to slouch.  Hold the incentive spirometer in an upright position. INSTRUCTIONS FOR USE  1. Sit on the edge of your bed if possible, or sit up as far as you can in bed or on a chair. 2. Hold the incentive spirometer in an upright position. 3. Breathe out normally. 4. Place the mouthpiece in your mouth and seal your lips tightly around it. 5. Breathe in slowly and as deeply as possible, raising the piston or the ball toward the top of the column. 6. Hold your breath for 3-5 seconds or for as long as possible. Allow the piston or ball to fall to the bottom of the column. 7. Remove the mouthpiece from your mouth and breathe out normally. 8. Rest for a few seconds and repeat Steps 1 through 7 at least 10 times every 1-2 hours when you are awake. Take your time and take a few normal breaths between deep breaths. 9. The spirometer may include an indicator to show  your best effort. Use the indicator as a goal to work toward during each repetition. 10. After each set of 10 deep breaths, practice coughing to be sure your lungs are clear. If you have an incision (the cut made at the time of surgery), support your  incision when coughing by placing a pillow or rolled up towels firmly against it. Once you are able to get out of bed, walk around indoors and cough well. You may stop using the incentive spirometer when instructed by your caregiver.  RISKS AND COMPLICATIONS  Take your time so you do not get dizzy or light-headed.  If you are in pain, you may need to take or ask for pain medication before doing incentive spirometry. It is harder to take a deep breath if you are having pain. AFTER USE  Rest and breathe slowly and easily.  It can be helpful to keep track of a log of your progress. Your caregiver can provide you with a simple table to help with this. If you are using the spirometer at home, follow these instructions: [place] IF:   You are having difficultly using the spirometer.  You have trouble using the spirometer as often as instructed.  Your pain medication is not giving enough relief while using the spirometer.  You develop fever of 100.5 F (38.1 C) or higher. SEEK IMMEDIATE MEDICAL CARE IF:   You cough up bloody sputum that had not been present before.  You develop fever of 102 F (38.9 C) or greater.  You develop worsening pain at or near the incision site. MAKE SURE YOU:   Understand these instructions.  Will watch your condition.  Will get help right away if you are not doing well or get worse. Document Released: 08/03/2006 Document Revised: 06/15/2011 Document Reviewed: 10/04/2006 ExitCare Patient Information 2014 ExitCare, [place].   ________________________________________________________________________  WHAT IS A BLOOD TRANSFUSION? Blood Transfusion Information  A transfusion is the replacement of blood or some of its parts. Blood is made up of multiple cells which provide different functions.  Red blood cells carry oxygen and are used for blood loss replacement.  [place] blood cells fight against infection.  Platelets control bleeding.  Plasma  helps clot blood.  Other blood products are available for specialized needs, such as hemophilia or other clotting disorders. BEFORE THE TRANSFUSION  Who gives blood for transfusions?   Healthy volunteers who are fully evaluated to make sure their blood is safe. This is blood bank blood. Transfusion therapy is the safest it has ever been in the practice of medicine. Before blood is taken from a donor, a complete history is taken to make sure that person has no history of diseases nor engages in risky social behavior (examples are intravenous drug use or sexual activity with multiple partners). The donor's travel history is screened to minimize risk of transmitting infections, such as malaria. The donated blood is tested for signs of infectious diseases, such as HIV and hepatitis. The blood is then tested to be sure it is compatible with you in order to minimize the chance of a transfusion reaction. If you or a relative donates blood, this is often done in anticipation of surgery and is not appropriate for emergency situations. It takes many days to process the donated blood. RISKS AND COMPLICATIONS Although transfusion therapy is very safe and saves many lives, the main dangers of transfusion include:   Getting an infectious disease.  Developing a transfusion reaction. This is an allergic reaction to  something in the blood you were given. Every precaution is taken to prevent this. The decision to have a blood transfusion has been considered carefully by your caregiver before blood is given. Blood is not given unless the benefits outweigh the risks. AFTER THE TRANSFUSION  Right after receiving a blood transfusion, you will usually feel much better and more energetic. This is especially true if your red blood cells have gotten low (anemic). The transfusion raises the level of the red blood cells which carry oxygen, and this usually causes an energy increase.  The nurse administering the transfusion  will monitor you carefully for complications. HOME CARE INSTRUCTIONS  No special instructions are needed after a transfusion. You may find your energy is better. Speak with your caregiver about any limitations on activity for underlying diseases you may have. SEEK MEDICAL CARE IF:   Your condition is not improving after your transfusion.  You develop redness or irritation at the intravenous (IV) site. SEEK IMMEDIATE MEDICAL CARE IF:  Any of the following symptoms occur over the next 12 hours:  Shaking chills.  You have a temperature by mouth above 102 F (38.9 C), not controlled by medicine.  Chest, back, or muscle pain.  People around you feel you are not acting correctly or are confused.  Shortness of breath or difficulty breathing.  Dizziness and fainting.  You get a rash or develop hives.  You have a decrease in urine output.  Your urine turns a dark color or changes to pink, red, or brown. Any of the following symptoms occur over the next 10 days:  You have a temperature by mouth above 102 F (38.9 C), not controlled by medicine.  Shortness of breath.  Weakness after normal activity.  The [place] part of the eye turns yellow (jaundice).  You have a decrease in the amount of urine or are urinating less often.  Your urine turns a dark color or changes to pink, red, or brown. Document Released: 03/20/2000 Document Revised: 06/15/2011 Document Reviewed: 11/07/2007 ExitCare Patient Information 2014 ExitCare, [place].  _______________________________________________________________________Eat a light diet the day before surgery.  Examples including soups, broths, toast, yogurt, mashed potatoes.  Things to avoid include carbonated beverages (fizzy beverages), raw fruits and raw vegetables, or beans.   If your bowels are filled with gas, your surgeon will have difficulty visualizing your pelvic organs which increases your surgical risks.

## 2015-12-30 ENCOUNTER — Ambulatory Visit (HOSPITAL_COMMUNITY)
Admission: RE | Admit: 2015-12-30 | Discharge: 2015-12-30 | Disposition: A | Discharge status: Home / Self Care | Payer: BLUE CROSS/BLUE SHIELD | Source: Ambulatory Visit | Attending: Hematology and Oncology | Admitting: Hematology and Oncology

## 2015-12-30 DIAGNOSIS — D500 Iron deficiency anemia secondary to blood loss (chronic): Secondary | ICD-10-CM

## 2015-12-30 LAB — PREPARE RBC (CROSSMATCH)

## 2015-12-30 MED ORDER — SODIUM CHLORIDE 0.9% FLUSH: 3.0000 mL | INTRAVENOUS | Status: DC | PRN

## 2015-12-30 MED ORDER — SODIUM CHLORIDE 0.9 % IV SOLN
250.0000 mL | Freq: Once | INTRAVENOUS | Status: AC
  Administered 2015-12-30: 250 mL via INTRAVENOUS

## 2015-12-30 NOTE — Discharge Instructions (Signed)
Blood Transfusion, Care After °Refer to this sheet in the next few weeks. These instructions provide you with information about caring for yourself after your procedure. Your health care provider may also give you more specific instructions. Your treatment has been planned according to current medical practices, but problems sometimes occur. Call your health care provider if you have any problems or questions after your procedure. °WHAT TO EXPECT AFTER THE PROCEDURE °After your procedure, it is common to have: °· Bruising and soreness at the IV site. °· Chills or fever. °· Headache. °HOME CARE INSTRUCTIONS °· Take medicines only as directed by your health care provider. Ask your health care provider if you can take an over-the-counter pain reliever in case you have a fever or headache a day or two after your transfusion. °· Return to your normal activities as directed by your health care provider. °SEEK MEDICAL CARE IF:  °· You develop redness or irritation at your IV site. °· You have persistent fever, chills, or headache. °· Your urine is darker than normal. °· Your urine turns pink, red, or brown.   °· The [place] part of your eye turns yellow (jaundice).   °· You feel weak after doing your normal activities.   °SEEK IMMEDIATE MEDICAL CARE IF:  °· You have trouble breathing. °· You have fever and chills along with: °¨ Anxiety. °¨ Chest or back pain. °¨ Flushed skin. °¨ Clammy skin. °¨ A rapid heartbeat. °¨ Nausea. °  °This information is not intended to replace advice given to you by your health care provider. Make sure you discuss any questions you have with your health care provider. °  °Document Released: 04/13/2014 Document Reviewed: 04/13/2014 °Elsevier Interactive Patient Education ©2016 [employer] ° °

## 2015-12-30 NOTE — Progress Notes (Signed)
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  Patient tolerated transfusion well. No reaction. Went over discharge instructions and copy given to patient. Alert, oriented and ambulatory at time of discharge. Discharged to home.

## 2015-12-31 ENCOUNTER — Encounter (HOSPITAL_COMMUNITY): Payer: Self-pay

## 2015-12-31 ENCOUNTER — Encounter (HOSPITAL_COMMUNITY)
Admission: RE | Admit: 2015-12-31 | Discharge: 2015-12-31 | Disposition: A | Discharge status: Home / Self Care | Payer: BLUE CROSS/BLUE SHIELD | Source: Ambulatory Visit | Attending: Gynecologic Oncology | Admitting: Gynecologic Oncology

## 2015-12-31 DIAGNOSIS — Z794 Long term (current) use of insulin: Secondary | ICD-10-CM | POA: Diagnosis not present

## 2015-12-31 DIAGNOSIS — E119 Type 2 diabetes mellitus without complications: Secondary | ICD-10-CM | POA: Diagnosis not present

## 2015-12-31 DIAGNOSIS — N1330 Unspecified hydronephrosis: Secondary | ICD-10-CM | POA: Diagnosis not present

## 2015-12-31 DIAGNOSIS — M1990 Unspecified osteoarthritis, unspecified site: Secondary | ICD-10-CM | POA: Diagnosis not present

## 2015-12-31 DIAGNOSIS — F17210 Nicotine dependence, cigarettes, uncomplicated: Secondary | ICD-10-CM | POA: Diagnosis not present

## 2015-12-31 DIAGNOSIS — N135 Crossing vessel and stricture of ureter without hydronephrosis: Secondary | ICD-10-CM | POA: Diagnosis not present

## 2015-12-31 DIAGNOSIS — D259 Leiomyoma of uterus, unspecified: Secondary | ICD-10-CM | POA: Diagnosis not present

## 2015-12-31 DIAGNOSIS — I10 Essential (primary) hypertension: Secondary | ICD-10-CM | POA: Diagnosis not present

## 2015-12-31 DIAGNOSIS — N731 Chronic parametritis and pelvic cellulitis: Secondary | ICD-10-CM | POA: Diagnosis not present

## 2015-12-31 HISTORY — DX: Other complications of anesthesia, initial encounter: T88.59XA

## 2015-12-31 HISTORY — DX: Headache: R51

## 2015-12-31 HISTORY — DX: Family history of other specified conditions: Z84.89

## 2015-12-31 HISTORY — DX: Headache, unspecified: R51.9

## 2015-12-31 HISTORY — DX: Adverse effect of unspecified anesthetic, initial encounter: T41.45XA

## 2015-12-31 LAB — URINALYSIS, ROUTINE W REFLEX MICROSCOPIC
BILIRUBIN URINE: NEGATIVE
Glucose, UA: NEGATIVE mg/dL
Ketones, ur: NEGATIVE mg/dL
NITRITE: NEGATIVE
PH: 6 (ref 5.0–8.0)
Protein, ur: NEGATIVE mg/dL
SPECIFIC GRAVITY, URINE: 1.016 (ref 1.005–1.030)

## 2015-12-31 LAB — CBC WITH DIFFERENTIAL/PLATELET
BASOS ABS: 0 10*3/uL (ref 0.0–0.1)
Basophils Relative: 1 %
Eosinophils Absolute: 0.1 10*3/uL (ref 0.0–0.7)
Eosinophils Relative: 2 %
HCT: 26.4 % — ABNORMAL LOW (ref 36.0–46.0)
HEMOGLOBIN: 8 g/dL — AB (ref 12.0–15.0)
LYMPHS ABS: 1.9 10*3/uL (ref 0.7–4.0)
LYMPHS PCT: 41 %
MCH: 20.4 pg — ABNORMAL LOW (ref 26.0–34.0)
MCHC: 30.3 g/dL (ref 30.0–36.0)
MCV: 67.2 fL — ABNORMAL LOW (ref 78.0–100.0)
MONOS PCT: 10 %
Monocytes Absolute: 0.5 10*3/uL (ref 0.1–1.0)
Neutro Abs: 2.2 10*3/uL (ref 1.7–7.7)
Neutrophils Relative %: 46 %
PLATELETS: 232 10*3/uL (ref 150–400)
RBC: 3.93 MIL/uL (ref 3.87–5.11)
RDW: 21.8 % — ABNORMAL HIGH (ref 11.5–15.5)
WBC: 4.7 10*3/uL (ref 4.0–10.5)

## 2015-12-31 LAB — URINE MICROSCOPIC-ADD ON

## 2015-12-31 LAB — COMPREHENSIVE METABOLIC PANEL
ALBUMIN: 4.4 g/dL (ref 3.5–5.0)
ALK PHOS: 46 U/L (ref 38–126)
ALT: 14 U/L (ref 14–54)
ANION GAP: 6 (ref 5–15)
AST: 17 U/L (ref 15–41)
BUN: 14 mg/dL (ref 6–20)
CALCIUM: 9.4 mg/dL (ref 8.9–10.3)
CHLORIDE: 110 mmol/L (ref 101–111)
CO2: 22 mmol/L (ref 22–32)
Creatinine, Ser: 1.26 mg/dL — ABNORMAL HIGH (ref 0.44–1.00)
GFR calc Af Amer: >60 mL/min (ref >60)
GFR calc non Af Amer: 52 mL/min — ABNORMAL LOW (ref >60)
GLUCOSE: 118 mg/dL — AB (ref 65–99)
Potassium: 4 mmol/L (ref 3.5–5.1)
SODIUM: 138 mmol/L (ref 135–145)
Total Bilirubin: 0.4 mg/dL (ref 0.3–1.2)
Total Protein: 7.8 g/dL (ref 6.5–8.1)

## 2015-12-31 LAB — PREGNANCY, URINE: Preg Test, Ur: NEGATIVE

## 2015-12-31 NOTE — Progress Notes (Signed)
CBC RESULTS ROUTED TO MELISSA CROSS NP BY EPIC

## 2016-01-01 LAB — URINE CULTURE: Culture: 50000 — AB

## 2016-01-01 NOTE — Progress Notes (Signed)
URINE CULTURE RESULTS ROUTED TO MELISSA CROSS NP BY EPIC INBASKET

## 2016-01-02 ENCOUNTER — Encounter (HOSPITAL_COMMUNITY): Payer: Self-pay | Admitting: Certified Registered Nurse Anesthetist

## 2016-01-02 ENCOUNTER — Encounter (HOSPITAL_COMMUNITY)
Admission: RE | Disposition: A | Discharge status: Home / Self Care | Payer: Self-pay | Source: Ambulatory Visit | Attending: Gynecologic Oncology

## 2016-01-02 ENCOUNTER — Ambulatory Visit (HOSPITAL_COMMUNITY)
Admission: RE | Admit: 2016-01-02 | Discharge: 2016-01-03 | Disposition: A | Discharge status: Home / Self Care | Payer: BLUE CROSS/BLUE SHIELD | Source: Ambulatory Visit | Attending: Gynecologic Oncology | Admitting: Gynecologic Oncology

## 2016-01-02 ENCOUNTER — Inpatient Hospital Stay (HOSPITAL_COMMUNITY): Payer: BLUE CROSS/BLUE SHIELD

## 2016-01-02 ENCOUNTER — Inpatient Hospital Stay (HOSPITAL_COMMUNITY): Payer: BLUE CROSS/BLUE SHIELD | Admitting: Certified Registered Nurse Anesthetist

## 2016-01-02 DIAGNOSIS — D509 Iron deficiency anemia, unspecified: Secondary | ICD-10-CM

## 2016-01-02 DIAGNOSIS — M1990 Unspecified osteoarthritis, unspecified site: Secondary | ICD-10-CM | POA: Insufficient documentation

## 2016-01-02 DIAGNOSIS — N7093 Salpingitis and oophoritis, unspecified: Secondary | ICD-10-CM | POA: Diagnosis not present

## 2016-01-02 DIAGNOSIS — N920 Excessive and frequent menstruation with regular cycle: Secondary | ICD-10-CM

## 2016-01-02 DIAGNOSIS — F17210 Nicotine dependence, cigarettes, uncomplicated: Secondary | ICD-10-CM | POA: Insufficient documentation

## 2016-01-02 DIAGNOSIS — N7011 Chronic salpingitis: Secondary | ICD-10-CM | POA: Diagnosis present

## 2016-01-02 DIAGNOSIS — N1330 Unspecified hydronephrosis: Secondary | ICD-10-CM | POA: Diagnosis present

## 2016-01-02 DIAGNOSIS — R1900 Intra-abdominal and pelvic swelling, mass and lump, unspecified site: Secondary | ICD-10-CM | POA: Diagnosis present

## 2016-01-02 DIAGNOSIS — N730 Acute parametritis and pelvic cellulitis: Secondary | ICD-10-CM

## 2016-01-02 DIAGNOSIS — D500 Iron deficiency anemia secondary to blood loss (chronic): Secondary | ICD-10-CM

## 2016-01-02 DIAGNOSIS — I10 Essential (primary) hypertension: Secondary | ICD-10-CM | POA: Insufficient documentation

## 2016-01-02 DIAGNOSIS — N135 Crossing vessel and stricture of ureter without hydronephrosis: Secondary | ICD-10-CM | POA: Diagnosis not present

## 2016-01-02 DIAGNOSIS — D259 Leiomyoma of uterus, unspecified: Secondary | ICD-10-CM | POA: Insufficient documentation

## 2016-01-02 DIAGNOSIS — Z794 Long term (current) use of insulin: Secondary | ICD-10-CM | POA: Insufficient documentation

## 2016-01-02 DIAGNOSIS — N731 Chronic parametritis and pelvic cellulitis: Secondary | ICD-10-CM | POA: Insufficient documentation

## 2016-01-02 DIAGNOSIS — E119 Type 2 diabetes mellitus without complications: Secondary | ICD-10-CM | POA: Insufficient documentation

## 2016-01-02 DIAGNOSIS — D649 Anemia, unspecified: Secondary | ICD-10-CM | POA: Diagnosis present

## 2016-01-02 HISTORY — PX: CYSTOSCOPY WITH STENT PLACEMENT: SHX5790

## 2016-01-02 HISTORY — PX: ROBOTIC ASSISTED TOTAL HYSTERECTOMY WITH BILATERAL SALPINGO OOPHERECTOMY: SHX6086

## 2016-01-02 LAB — TYPE AND SCREEN
ABO/RH(D): O POS
ABO/RH(D): O POS
Antibody Screen: NEGATIVE
Antibody Screen: NEGATIVE
UNIT DIVISION: 0
Unit division: 0

## 2016-01-02 LAB — GLUCOSE, CAPILLARY
GLUCOSE-CAPILLARY: 170 mg/dL — AB (ref 65–99)
GLUCOSE-CAPILLARY: 179 mg/dL — AB (ref 65–99)
Glucose-Capillary: 198 mg/dL — ABNORMAL HIGH (ref 65–99)

## 2016-01-02 SURGERY — HYSTERECTOMY, TOTAL, ROBOT-ASSISTED, LAPAROSCOPIC, WITH BILATERAL SALPINGO-OOPHORECTOMY
Anesthesia: General | Laterality: Right

## 2016-01-02 MED ORDER — FENTANYL CITRATE (PF) 100 MCG/2ML IJ SOLN
INTRAMUSCULAR | Status: AC
  Filled 2016-01-02: qty 2

## 2016-01-02 MED ORDER — ONDANSETRON HCL 4 MG/2ML IJ SOLN
INTRAMUSCULAR | Status: DC | PRN
  Administered 2016-01-02: 4 mg via INTRAVENOUS

## 2016-01-02 MED ORDER — INSULIN DETEMIR 100 UNIT/ML ~~LOC~~ SOLN
15.0000 [IU] | Freq: Two times a day (BID) | SUBCUTANEOUS | Status: DC
  Administered 2016-01-02 – 2016-01-03 (×2): 15 [IU] via SUBCUTANEOUS
  Filled 2016-01-02 (×3): qty 0.15

## 2016-01-02 MED ORDER — INSULIN ASPART 100 UNIT/ML ~~LOC~~ SOLN
0.0000 [IU] | Freq: Three times a day (TID) | SUBCUTANEOUS | Status: DC
  Administered 2016-01-03: 8 [IU] via SUBCUTANEOUS
  Filled 2016-01-02: qty 1

## 2016-01-02 MED ORDER — SODIUM CHLORIDE 0.9 % IR SOLN
Status: DC | PRN
  Administered 2016-01-02: 4000 mL

## 2016-01-02 MED ORDER — SUGAMMADEX SODIUM 200 MG/2ML IV SOLN
INTRAVENOUS | Status: DC | PRN
  Administered 2016-01-02: 200 mg via INTRAVENOUS

## 2016-01-02 MED ORDER — PROMETHAZINE HCL 25 MG/ML IJ SOLN: 6.2500 mg | INTRAMUSCULAR | Status: DC | PRN

## 2016-01-02 MED ORDER — CIPROFLOXACIN IN D5W 400 MG/200ML IV SOLN
INTRAVENOUS | Status: AC
  Filled 2016-01-02: qty 200

## 2016-01-02 MED ORDER — ROCURONIUM BROMIDE 10 MG/ML (PF) SYRINGE
PREFILLED_SYRINGE | INTRAVENOUS | Status: DC | PRN
  Administered 2016-01-02: 20 mg via INTRAVENOUS
  Administered 2016-01-02: 60 mg via INTRAVENOUS
  Administered 2016-01-02 (×2): 10 mg via INTRAVENOUS

## 2016-01-02 MED ORDER — HYDROMORPHONE HCL 1 MG/ML IJ SOLN
INTRAMUSCULAR | Status: AC
  Filled 2016-01-02: qty 1

## 2016-01-02 MED ORDER — KCL IN DEXTROSE-NACL 20-5-0.45 MEQ/L-%-% IV SOLN
INTRAVENOUS | Status: DC
  Administered 2016-01-02: 19:00:00 via INTRAVENOUS
  Filled 2016-01-02 (×2): qty 1000

## 2016-01-02 MED ORDER — PROPOFOL 10 MG/ML IV BOLUS
INTRAVENOUS | Status: AC
  Filled 2016-01-02: qty 20

## 2016-01-02 MED ORDER — LACTATED RINGERS IV SOLN: INTRAVENOUS | Status: DC

## 2016-01-02 MED ORDER — ONDANSETRON HCL 4 MG PO TABS: 4.0000 mg | ORAL_TABLET | Freq: Four times a day (QID) | ORAL | Status: DC | PRN

## 2016-01-02 MED ORDER — HYDROMORPHONE HCL 1 MG/ML IJ SOLN
0.2500 mg | INTRAMUSCULAR | Status: DC | PRN
  Administered 2016-01-02: 0.25 mg via INTRAVENOUS
  Administered 2016-01-02 (×2): 0.5 mg via INTRAVENOUS
  Administered 2016-01-02: 0.25 mg via INTRAVENOUS
  Administered 2016-01-02: 0.5 mg via INTRAVENOUS

## 2016-01-02 MED ORDER — FENTANYL CITRATE (PF) 100 MCG/2ML IJ SOLN
INTRAMUSCULAR | Status: DC | PRN
  Administered 2016-01-02 (×2): 100 ug via INTRAVENOUS
  Administered 2016-01-02 (×2): 50 ug via INTRAVENOUS

## 2016-01-02 MED ORDER — KETOROLAC TROMETHAMINE 15 MG/ML IJ SOLN
15.0000 mg | Freq: Four times a day (QID) | INTRAMUSCULAR | Status: DC
  Administered 2016-01-02 – 2016-01-03 (×3): 15 mg via INTRAVENOUS
  Filled 2016-01-02 (×3): qty 1

## 2016-01-02 MED ORDER — LIDOCAINE 2% (20 MG/ML) 5 ML SYRINGE
INTRAMUSCULAR | Status: DC | PRN
  Administered 2016-01-02: 50 mg via INTRAVENOUS

## 2016-01-02 MED ORDER — PROPOFOL 10 MG/ML IV BOLUS
INTRAVENOUS | Status: DC | PRN
  Administered 2016-01-02: 180 mg via INTRAVENOUS

## 2016-01-02 MED ORDER — LACTATED RINGERS IV SOLN
INTRAVENOUS | Status: DC | PRN
  Administered 2016-01-02 (×2): via INTRAVENOUS

## 2016-01-02 MED ORDER — METRONIDAZOLE IN NACL 5-0.79 MG/ML-% IV SOLN
500.0000 mg | Freq: Once | INTRAVENOUS | Status: AC
  Administered 2016-01-02: 500 mg via INTRAVENOUS
  Filled 2016-01-02: qty 100

## 2016-01-02 MED ORDER — ONDANSETRON HCL 4 MG/2ML IJ SOLN
INTRAMUSCULAR | Status: AC
  Filled 2016-01-02: qty 2

## 2016-01-02 MED ORDER — ONDANSETRON HCL 4 MG/2ML IJ SOLN
4.0000 mg | Freq: Four times a day (QID) | INTRAMUSCULAR | Status: DC | PRN
  Administered 2016-01-02: 4 mg via INTRAVENOUS
  Filled 2016-01-02: qty 2

## 2016-01-02 MED ORDER — FENTANYL CITRATE (PF) 100 MCG/2ML IJ SOLN
INTRAMUSCULAR | Status: AC
  Filled 2016-01-02: qty 4

## 2016-01-02 MED ORDER — OXYCODONE-ACETAMINOPHEN 5-325 MG PO TABS
1.0000 | ORAL_TABLET | ORAL | Status: DC | PRN
  Administered 2016-01-02: 1 via ORAL
  Administered 2016-01-03 (×2): 2 via ORAL
  Filled 2016-01-02 (×2): qty 1
  Filled 2016-01-02 (×2): qty 2

## 2016-01-02 MED ORDER — METRONIDAZOLE IN NACL 5-0.79 MG/ML-% IV SOLN
INTRAVENOUS | Status: AC
  Filled 2016-01-02: qty 100

## 2016-01-02 MED ORDER — MIDAZOLAM HCL 2 MG/2ML IJ SOLN
INTRAMUSCULAR | Status: AC
  Filled 2016-01-02: qty 2

## 2016-01-02 MED ORDER — INSULIN ASPART 100 UNIT/ML ~~LOC~~ SOLN
4.0000 [IU] | Freq: Three times a day (TID) | SUBCUTANEOUS | Status: DC
  Administered 2016-01-03: 4 [IU] via SUBCUTANEOUS
  Filled 2016-01-02: qty 1

## 2016-01-02 MED ORDER — CLINDAMYCIN PHOSPHATE 900 MG/50ML IV SOLN
INTRAVENOUS | Status: AC
  Filled 2016-01-02: qty 50

## 2016-01-02 MED ORDER — HYDROMORPHONE HCL 1 MG/ML IJ SOLN
0.2000 mg | INTRAMUSCULAR | Status: DC | PRN
  Administered 2016-01-02: 0.6 mg via INTRAVENOUS
  Administered 2016-01-02: 0.5 mg via INTRAVENOUS
  Administered 2016-01-03: 0.6 mg via INTRAVENOUS
  Filled 2016-01-02 (×3): qty 1

## 2016-01-02 MED ORDER — MEPERIDINE HCL 50 MG/ML IJ SOLN: 6.2500 mg | INTRAMUSCULAR | Status: DC | PRN

## 2016-01-02 MED ORDER — SUGAMMADEX SODIUM 200 MG/2ML IV SOLN
INTRAVENOUS | Status: AC
  Filled 2016-01-02: qty 2

## 2016-01-02 MED ORDER — CIPROFLOXACIN IN D5W 400 MG/200ML IV SOLN
400.0000 mg | INTRAVENOUS | Status: AC
  Administered 2016-01-02: 400 mg via INTRAVENOUS

## 2016-01-02 MED ORDER — CLINDAMYCIN PHOSPHATE 900 MG/50ML IV SOLN
900.0000 mg | INTRAVENOUS | Status: AC
  Administered 2016-01-02: 900 mg via INTRAVENOUS

## 2016-01-02 MED ORDER — IOHEXOL 300 MG/ML  SOLN
INTRAMUSCULAR | Status: DC | PRN
  Administered 2016-01-02: 16 mL via URETHRAL

## 2016-01-02 MED ORDER — MIDAZOLAM HCL 5 MG/5ML IJ SOLN
INTRAMUSCULAR | Status: DC | PRN
  Administered 2016-01-02: 2 mg via INTRAVENOUS

## 2016-01-02 SURGICAL SUPPLY — 70 items
APL ESCP 34 STRL LF DISP (HEMOSTASIS)
APPLICATOR SURGIFLO ENDO (HEMOSTASIS) IMPLANT
BAG LAPAROSCOPIC 12 15 PORT 16 (BASKET) IMPLANT
BAG RETRIEVAL 12/15 (BASKET) ×3
BAG SPEC RTRVL LRG 6X4 10 (ENDOMECHANICALS)
BAG URO CATCHER STRL LF (MISCELLANEOUS) ×2 IMPLANT
CATH INTERMIT  6FR 70CM (CATHETERS) ×3 IMPLANT
CELLS DAT CNTRL 66122 CELL SVR (MISCELLANEOUS) ×2 IMPLANT
CHLORAPREP W/TINT 26ML (MISCELLANEOUS) ×3 IMPLANT
CLOTH BEACON ORANGE TIMEOUT ST (SAFETY) ×3 IMPLANT
COVER SURGICAL LIGHT HANDLE (MISCELLANEOUS) ×3 IMPLANT
COVER TIP SHEARS 8 DVNC (MISCELLANEOUS) ×2 IMPLANT
COVER TIP SHEARS 8MM DA VINCI (MISCELLANEOUS) ×1
DRAPE ARM DVNC X/XI (DISPOSABLE) ×8 IMPLANT
DRAPE COLUMN DVNC XI (DISPOSABLE) ×2 IMPLANT
DRAPE DA VINCI XI ARM (DISPOSABLE) ×4
DRAPE DA VINCI XI COLUMN (DISPOSABLE) ×1
DRAPE SHEET LG 3/4 BI-LAMINATE (DRAPES) ×6 IMPLANT
DRAPE SURG IRRIG POUCH 19X23 (DRAPES) ×3 IMPLANT
ELECT REM PT RETURN 15FT ADLT (MISCELLANEOUS) ×2 IMPLANT
GLOVE BIO SURGEON STRL SZ 6 (GLOVE) ×12 IMPLANT
GLOVE BIO SURGEON STRL SZ 6.5 (GLOVE) ×10 IMPLANT
GLOVE BIOGEL M STRL SZ7.5 (GLOVE) ×3 IMPLANT
GOWN STRL REUS W/ TWL LRG LVL3 (GOWN DISPOSABLE) ×4 IMPLANT
GOWN STRL REUS W/TWL LRG LVL3 (GOWN DISPOSABLE) ×24 IMPLANT
GUIDEWIRE STR DUAL SENSOR (WIRE) ×3 IMPLANT
HOLDER FOLEY CATH W/STRAP (MISCELLANEOUS) ×3 IMPLANT
IRRIG SUCT STRYKERFLOW 2 WTIP (MISCELLANEOUS) ×3
IRRIGATION SUCT STRKRFLW 2 WTP (MISCELLANEOUS) ×2 IMPLANT
KIT BASIN OR (CUSTOM PROCEDURE TRAY) ×3 IMPLANT
KIT PROCEDURE DA VINCI SI (MISCELLANEOUS)
KIT PROCEDURE DVNC SI (MISCELLANEOUS) IMPLANT
LIQUID BAND (GAUZE/BANDAGES/DRESSINGS) ×3 IMPLANT
MANIFOLD NEPTUNE II (INSTRUMENTS) ×3 IMPLANT
MANIPULATOR UTERINE 4.5 ZUMI (MISCELLANEOUS) ×3 IMPLANT
MARKER SKIN DUAL TIP RULER LAB (MISCELLANEOUS) ×3 IMPLANT
NDL SAFETY ECLIPSE 18X1.5 (NEEDLE) ×2 IMPLANT
NDL SPNL 18GX3.5 QUINCKE PK (NEEDLE) ×2 IMPLANT
NEEDLE HYPO 18GX1.5 SHARP (NEEDLE) ×3
NEEDLE SPNL 18GX3.5 QUINCKE PK (NEEDLE) ×3 IMPLANT
OBTURATOR XI 8MM BLADELESS (TROCAR) ×3 IMPLANT
OCCLUDER COLPOPNEUMO (BALLOONS) ×3 IMPLANT
PACK CYSTO (CUSTOM PROCEDURE TRAY) ×2 IMPLANT
PAD POSITIONING PINK XL (MISCELLANEOUS) ×3 IMPLANT
PORT ACCESS TROCAR AIRSEAL 12 (TROCAR) ×2 IMPLANT
PORT ACCESS TROCAR AIRSEAL 5M (TROCAR) ×1
POUCH SPECIMEN RETRIEVAL 10MM (ENDOMECHANICALS) IMPLANT
RETRACTOR WND ALEXIS 18 MED (MISCELLANEOUS) IMPLANT
RTRCTR WOUND ALEXIS 18CM MED (MISCELLANEOUS) ×3
SEAL CANN UNIV 5-8 DVNC XI (MISCELLANEOUS) ×8 IMPLANT
SEAL XI 5MM-8MM UNIVERSAL (MISCELLANEOUS) ×4
SET TRI-LUMEN FLTR TB AIRSEAL (TUBING) ×3 IMPLANT
SHEET LAVH (DRAPES) ×3 IMPLANT
SOLUTION ELECTROLUBE (MISCELLANEOUS) ×3 IMPLANT
STENT CONTOUR 6FRX26X.038 (STENTS) ×1 IMPLANT
SURGIFLO W/THROMBIN 8M KIT (HEMOSTASIS) IMPLANT
SUT MNCRL AB 4-0 PS2 18 (SUTURE) ×6 IMPLANT
SUT VIC AB 0 CT1 27 (SUTURE) ×12
SUT VIC AB 0 CT1 27XBRD ANTBC (SUTURE) ×2 IMPLANT
SYR 50ML LL SCALE MARK (SYRINGE) ×3 IMPLANT
SYRINGE 10CC LL (SYRINGE) ×3 IMPLANT
TOWEL OR 17X26 10 PK STRL BLUE (TOWEL DISPOSABLE) ×6 IMPLANT
TOWEL OR NON WOVEN STRL DISP B (DISPOSABLE) ×3 IMPLANT
TRAP SPECIMEN MUCOUS 40CC (MISCELLANEOUS) IMPLANT
TRAY LAPAROSCOPIC (CUSTOM PROCEDURE TRAY) ×3 IMPLANT
TROCAR BLADELESS OPT 5 100 (ENDOMECHANICALS) ×3 IMPLANT
TUBING CONNECTING 10 (TUBING) ×3 IMPLANT
UNDERPAD 30X30 (UNDERPADS AND DIAPERS) ×3 IMPLANT
UNDERPAD 30X30 INCONTINENT (UNDERPADS AND DIAPERS) ×3 IMPLANT
WATER STERILE IRR 1500ML POUR (IV SOLUTION) ×5 IMPLANT

## 2016-01-02 NOTE — Op Note (Signed)
OPERATIVE NOTE 01/02/16  Surgeon: Donaciano Eva   Assistants: Dr Lahoma Crocker (an MD assistant was necessary for tissue manipulation, management of robotic instrumentation, retraction and positioning due to the complexity of the case and hospital policies).   Anesthesia: General endotracheal anesthesia  ASA Class: 2   Pre-operative Diagnosis: symptomatic uterine fibroids, chronic iron deficiency anemia, chronic tubo-ovarian abscesses, menorrhagia, right hydroureter and distal ureteral obstruction  Post-operative Diagnosis: same  Operation: Robotic-assisted laparoscopic total hysterectomy for uterus >250gm with bilateral salpingoophorectomy, lysis of adhesions. Right ureterolysis. 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 16cm fibroid uterus. Adhesions between rectum and sigmoid and posterior uterus and ovaries. Very dilated fallopian tubes bilaterally (hydrosalpinx). RIght hydroureter with distal ureteral narrowing noted on pyelography.  No intrinsic lesions seen on ureteroscopy.  Right retroperitoneal fibrosis.  Estimated Blood Loss:  <25cc      Total IV Fluids: 700 ml         Specimens: uterus, cervix, bilateral tubes and ovaries.         Complications:  None; patient tolerated the procedure well.         Disposition: PACU - hemodynamically stable.  Procedure Details  The patient was seen in the Holding Room. The risks, benefits, complications, treatment options, and expected outcomes were discussed with the patient.  The patient concurred with the proposed plan, giving informed consent.  The site of surgery properly noted/marked. The patient was identified as Amy Lowe and the  procedure verified as a Robotic-assisted hysterectomy with bilateral salpingo oophorectomy. A Time Out was held and the above information confirmed.  After induction of anesthesia, the patient was draped and prepped in the usual sterile manner. Pt was placed in supine position after anesthesia and draped and prepped in the usual sterile manner. The abdominal drape was placed after the CholoraPrep had been allowed to dry for 3 minutes.  Her arms were tucked to her side with all appropriate precautions.  The shoulders were stabilized with padded shoulder blocks applied to the acromium processes.  The patient was placed in the semi-lithotomy position in [place].  The perineum was prepped with Betadine. The patient was then prepped.   Dr Alinda Money performed the cystoscopic portion of the procedure (please refer to his separate operative note). He exchanged the right ureteral stent. Foley catheter was placed.  A sterile speculum was placed in the vagina.  The cervix was grasped with a single-tooth tenaculum and dilated with Kennon Rounds dilators.  The ZUMI uterine manipulator with a medium colpotomizer ring was placed without difficulty.  A pneum occluder balloon was placed over the manipulator.  OG tube placement was confirmed and to suction.   Next, a 5 mm skin incision was made 1 cm below the subcostal margin in the midclavicular line.  The 5 mm Optiview port and scope was used for direct entry.  Opening pressure was under 10 mm CO2.  The abdomen was insufflated and the findings were noted as above.   At this point and all points during the procedure, the patient's intra-abdominal pressure did not exceed 15 mmHg. Next, a 10 mm skin incision was made 2cm above the umbilicus and a right and left port was placed about 10 cm lateral to the robot port on the right and  left side.  A fourth arm was placed in the left lower quadrant 2 cm above and superior and medial to the anterior superior iliac spine.  All ports were  placed under direct visualization.  The patient was placed in steep Trendelenburg.  Bowel was folded away into the upper abdomen.  The robot was docked in the normal manner.  For approximately 30 minutes sharp adhesiolysis was performed to free the adhesions between the sigmoid colon and rectum and posterior uterus and ovaries and tubes. The hysterectomy was started after the round ligament on the right side was incised and the retroperitoneum was entered and the pararectal space was developed.  The ureter was noted to be on the medial leaf of the broad ligament. It was very dilated. The peritoneum above the ureter was incised and stretched and the infundibulopelvic ligament was skeletonized, cauterized and cut.  Right ureterolysis was performed to the level of the uterine arteries to free the right ureter off of the right peritoneum. At the level of the uterine arteries there was substantial retroperitoneal fibroisis. The posterior peritoneum was taken down to the level of the KOH ring.  The anterior peritoneum was also taken down.  The bladder flap was created to the level of the KOH ring.  The uterine artery on the right side was skeletonized, cauterized and cut in the normal manner.  A similar procedure was performed on the left.  The ureter on the left was not dilated. The rectovaginal septum was created with sharp and blunt dissection.The colpotomy was made and the uterus, cervix, bilateral ovaries and tubes were amputated and placed in an endocatch bag. The bag was brought to the vagina where an alexis retractor was used to distend the vagina and allow the bag to be delivered into the vagina. For 30 minutes sharp morcellation within the bag was performed and the specimen was removed in 2 pieces.  Pedicles were inspected and excellent hemostasis was achieved.    The colpotomy at the vaginal cuff was closed with Vicryl on a CT1 needle in figure of 8 sutures.  Irrigation was used and excellent hemostasis was  achieved.  At this point in the procedure was completed.  Robotic instruments were removed under direct visulaization.  The robot was undocked. The 10 mm ports were closed with Vicryl on a UR-5 needle and the fascia was closed with 0 Vicryl on a UR-5 needle.  The skin was closed with 4-0 Vicryl in a subcuticular manner.  Dermabond was applied.  Sponge, lap and needle counts correct x 2.  The patient was taken to the recovery room in stable condition.  The vagina was swabbed with  minimal bleeding noted.   All instrument and needle counts were correct x  3.   The patient was transferred to the recovery room in a stable condition.  Donaciano Eva, MD

## 2016-01-02 NOTE — Op Note (Signed)
Preoperative diagnosis: Right ureteral obstruction, right hydronephrosis  Postoperative diagnosis: Right ureteral obstruction, right hydronephrosis  Procedure: 1.  Cystoscopy 2.  Right retrograde pyelography with interpretation 3.  Right ureteroscopy 4.  Right ureteral stent placement (6 x 26 - no string)  Surgeon: Pryor Curia. M.D.  Anesthesia: General  Complications: None  EBL: None  Intraoperative findings: Right retrograde pyelography was performed with a 6 French ureteral catheter and Omnipaque contrast.  This demonstrated a severely dilated and tortuous right ureter.  Retrograde pyelography didn't demonstrate a questionable narrowing at the distal ureter as previously noted on prior retrograde pyelography in June.  No definite intraluminal filling defects were identified.  Although there was dilation of the right renal pelvis, there was not noted to be calyceal dilation.  Indication: Amy Lowe is a 40 year old female with a fibroid uterus.  She has severe menorrhagia and chronic anemia.  She was initially seen by Dr. Irine Seal in June for right-sided hydronephrosis and presumed right ureteral obstruction.  She underwent retrograde pyelography with stent a stent at that time.  She has since proceeded with hormonal therapy for treatment of her fibroid uterus and now presents for definitive surgical therapy.  I have recommended proceeding with cystoscopy, repeat retrograde pyelography, and right ureteral stent placement.  She also understands the potential need for right ureteral reimplantation pending intraoperative findings.  We discussed the potential risks, complications, and expected recovery associated with this process.  Informed consent has been obtained.  Description of procedure: The patient was taken to the operating room and a general anesthetic was administered.  She was given preoperative antibiotics, placed in the dorsal lithotomy position, and prepped and draped in  the usual sterile fashion.  A preoperative timeout was performed.  Cystourethroscopy was then performed which revealed a normal urethra.  Inspection of the bladder revealed no bladder tumors, stones, or other mucosal pathology.  She did have an edematous right ureteral orifice with an indwelling ureteral stent.  Flexible graspers were then used to remove her right ureteral stent and a 0.38 sensor guidewire was advanced up the ureter under fluoroscopic guidance.  The cystoscope was then placed over the wire and a 6 French ureteral catheter was replaced over the wire into the distal left ureter.  Omnipaque contrast was injected with findings as dictated above.  There was noted to be significant dilation and tortuosity of the proximal ureter down to the distal 3 cm of the ureter.  Contrast appeared to drain from the right ureter despite the dilation with no clear evidence of obstruction or intraluminal filling defects.  I elected to then perform ureteroscopy to further evaluate the intraluminal ureter to determine the potential need for ureteral reimplantation.  The 6 French semirigid ureteroscope was then placed next to the wire in the distal ureter was examined.  It did appear to be quite edematous but without evidence of stricture or intraluminal narrowing.  It was felt that she was not likely obstructed.  Considering the fact that she was then undergo a hysterectomy due to the potential difficulty with the dissection around the ureter, it was decided to replace a right ureteral stent for intraoperative identification of the ureter and in case she should require ureteral reconstruction intraoperatively.  The wire was replaced up into the right renal pelvis.  There was noted be tortuosity of the ureter requiring manipulation of the wire with a 6 French ureteral catheter to get it into the renal pelvis.  The wire was then backloaded on  the cystoscope and a 6 x 26 double-J ureteral stent was advanced over the wire  using Seldinger technique.  It was positioned appropriately under fluoroscopic and cystoscopic guidance.  The wire was removed with a good curl noted in the renal pelvis as well as within the bladder.  The patient tolerated this procedure well and without complications.  At this point, the case was turned over to Dr. Denman George to proceed with the hysterectomy.

## 2016-01-02 NOTE — Anesthesia Postprocedure Evaluation (Signed)
Anesthesia Post Note  Patient: Amy Lowe  Procedure(s) Performed: Procedure(s) (LRB): XI ROBOTIC ASSISTED TOTAL HYSTERECTOMY WITH BILATERAL SALPINGO OOPHORECTOMY, LYSIS OF ADHESIONS, UTERUS GRADED AT 250 GRAMS (Bilateral) CYSTOSCOPY WITH RIGHT URETERAL STENT CHANGE AND RETROGRAGE (Right)  Patient location during evaluation: PACU Anesthesia Type: General Level of consciousness: sedated and patient cooperative Pain management: pain level controlled Vital Signs Assessment: post-procedure vital signs reviewed and stable Respiratory status: spontaneous breathing Cardiovascular status: stable Anesthetic complications: no    Last Vitals:  Vitals:   01/02/16 1715 01/02/16 1734  BP: (!) 153/69 (!) 143/63  Pulse: 77 75  Resp: 19 19  Temp: 36.4 C 37 C    Last Pain:  Vitals:   01/02/16 1736  TempSrc:   PainSc: [place]

## 2016-01-02 NOTE — H&P (View-Only) (Signed)
Follow-up Note: Gyn-Onc  Consult was initially requested by Dr. Nelva Bush for the evaluation of Amy Lowe 40 y.o. female  CC:  Chief Complaint  Patient presents with  . tubo-ovarian abscesses  . symptomatic fibroid uterus  . right hydroureter    Assessment/Plan:  Ms. Amy Lowe  is a 40 y.o.  year old with TOA's bilaterally, fibroid uterus, menorrhagia, bilateral hydroureter, right distal ureteral obstruction, poorly controlled diabetes mellitus, chronic pelvic pain.  I am recommending hysterectomy, BSO and right ureteral re-implantation with stenting. She has had some, but minimal, response to depot lupron. Recommend an additional injection in late August, 2017. This will help with fibroid size and preoeperative bleeding/anemia.  I believe, based on my exam, that she may be a candidate for a minimally invasive procedure with robotic assistance. I will perform the surgery with Dr Dutch Gray from [facility] to assist me during the procedure for distal right ureteral resection and reimplantation.  She was informed that if pelvic disease or adhesive disease is too extensive to facilitate a minimally invasive approach she will require conversion to laparotomy which would involve a longer recovery.  I anticipate that she will require 6 weeks to recover from such an extensive procedure (even if performed robotically). The patient insists that she does not have this much time to recover and be off work. 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  I discussed surgical risks extensively with [name] including  bleeding, infection, damage to internal organs (such as bladder,ureters, bowels), blood clot, reoperation and rehospitalization. I discussed that her adhesive disease from TOA's would be associated with increased risk of damage to visceral organs,  and potentially the need for GI resection and reanastomosis. Rarely colostomy is required.   1/ diabetes. appointment with her endocrinologist, Dr Posey Pronto, to optimize blood glucose control. HbA1C (recheck today). Adjustments made to regimen.  2/ blood loss anemia: optimize preoperative Hb by minimizing excessive menstruation. She has been on Depot Lupron for 3 months to cause fibroid involution and suppress ovulation and avoid menses preoperatively. Hb 5.6 today (patient is hemodynamically stable and has adjusted to this chronic severe anemia). Plan for preop transfusion with 2 units of PRBC and type and cross for possible additional units intraop given her severe anemia.  3/  Surgery scheduled for 01/02/16  HPI: Amy Lowe is a 40 year old G0 who is seen at the request of Dr. Nelva Bush for pelvic inflammatory disease, tubo-ovarian abscesses, menorrhagia, uterine fibroids, and multiple medical comorbidities. The patient has most recently been admitted to [facility] in June 2017. She was admitted with tubo-ovarian abscess and sepsis and bilateral hydronephrosis. She was treated with an INR directed trans-gluteal drain into the pelvic abscesses, IV antibiotics, bilateral ureteral stent placement, and blood transfusion for a critical hemoglobin of 3.8. This anemia was associated with an likely caused by heavy menorrhagia from uterine fibroids. As a result she was also started on Lysteda 627m 2 tablets by mouth 3 times a day and Megace 40 mg by mouth twice a day. This helps slow down her bleeding somewhat. She was also transfused up from hemoglobin of 3.8 to a discharge hemoglobin of 7.5.  She is discharged home with a transgluteal drain in place but this has subsequently been removed due to clinical improvement and adequacy of drainage. Of note at the time of  drain removal other pockets of undrained fluid and residual hydrosalpinx remained in the pelvis.  The patient's complex pelvic history  began at age 55 when she was diagnosed with pelvic inflammatory disease and admitted to [facility] in [facility] where she was treated with what sounds like a conservative management with IV antibiotics only and no surgical intervention. She also has a history of menorrhagia for her entire menstrual life. However in recent years this is become substantially heavier. She also reports cyclic pain in the pelvis particularly around the time of menses. She is undergone several laparoscopies most recently in 2014. These were performed due to menorrhagia and pelvic pain. They revealed multiple adhesions between small and large intestine and grossly dilated tubes and ovaries bilaterally. Her symptoms have been managed as an outpatient up until this recent admission to [facility] in June 2017.  For several years she's been recommended to undergo hysterectomy due to her known tubo-ovarian abscesses, severe pelvic inflammatory disease, and symptomatic menorrhagia and fibroid uterus. She declined this due to strong desire for future fertility. She is also extremely concerned about passing through menopause at an early age.  In addition to her complex gynecologic history the patient is also overweight with a BMI of 32.4 kg meters squared. She has a history of type 2 diabetes mellitus and for this she takes insulin. Her endocrinologist is Dr. Posey Pronto. She states that her most recent HbA1c was 6.8 but this may have been as long ago as January 2017. She denies taking all of her prescribed insulin medication and therefore during her recent hospitalization noticed hypoglycemic episodes and she was been prescribed and administered all of her insulin doses. She rarely checks her blood glucose at home despite her need for insulin use.  The patient has a history of tobacco use. She chronically takes narcotics for pelvic pain.   Interval Hx:  She had one heavy period in the past month which had large clots. She  has felt very fatigued since that time.  She was started on Depot Lupron to suppress menses in June, 2017. This induced some decrease in bleeding, however she is still persistently bleeding daily. She has come to terms with the fact that she will require hysterectomy and that this will involve permanent loss of fertility. She has seen her endocrinologist, Dr Posey Pronto who modify her diet and to take humalog at least twice daily as per sliding scale. She has met with Dr Alinda Money and discussed right ureteral resection and reimplantation.   Current Meds:  Outpatient Encounter Prescriptions as of 12/25/2015  Medication Sig  . cyclobenzaprine (FLEXERIL) 10 MG tablet Take 10 mg by mouth.  . diphenhydrAMINE (BENADRYL) 25 MG tablet Take 25 mg by mouth every 6 (six) hours as needed. For allergy symptom relief  . hydrocortisone (ANUSOL-HC) 2.5 % rectal cream Place rectally 3 (three) times daily. (Patient not taking: Reported on 11/25/2015)  . insulin detemir (LEVEMIR) 100 UNIT/ML injection Inject 18 Units into the skin 2 (two) times daily.  . insulin lispro (HUMALOG) 100 UNIT/ML injection Inject 8 Units into the skin 3 (three) times daily before meals.  . megestrol (MEGACE) 20 MG tablet Take 1 tablet (20 mg total) by mouth 2 (two) times daily. (Patient taking differently: Take 40 mg by mouth 2 (two) times daily. )  . nystatin-triamcinolone (MYCOLOG II) cream Apply topically 2 (two) times daily. (Patient not taking: Reported on 11/25/2015)  . polyethylene glycol (MIRALAX / GLYCOLAX) packet Take 17 g by mouth 2 (  two) times daily. (Patient not taking: Reported on 11/25/2015)  . tranexamic acid (LYSTEDA) 650 MG TABS tablet Take 1 tablet by mouth 3 (three) times daily. Take two tablets three times daily during the first five days of menses.   No facility-administered encounter medications on file as of 12/25/2015.     Allergy: PCN, iodinated contrast agents  Social Hx:   Social History   Social History  . Marital  status: Married    Spouse name: N/A  . Number of children: N/A  . Years of education: N/A   Occupational History  . Not on file.   Social History Main Topics  . Smoking status: Current Some Day Smoker    Packs/day: 0.25    Types: Cigarettes  . Smokeless tobacco: Never Used  . Alcohol use Yes     Comment: occ  . Drug use: No  . Sexual activity: Yes   Other Topics Concern  . Not on file   Social History Narrative  . No narrative on file    Past Surgical Hx:  Past Surgical History:  Procedure Laterality Date  . abdominal laparascopy    . CYSTOSCOPY W/ URETERAL STENT PLACEMENT Right 09/05/2015   Procedure: CYSTO WITH RIGHT RETROGRADE AND STENT PLACEMENT;  Surgeon: Irine Seal, MD;  Location: [facility];  Service: Urology;  Laterality: Right;  . DILATION AND CURETTAGE OF UTERUS    . DNC    . HERNIA REPAIR      Past Medical Hx:  Past Medical History:  Diagnosis Date  . Anemia   . Diabetes mellitus     Past Gynecological History:  PID diagnosed at age 24. Menorrhagia. Fibroids. Tubo-ovarian abscesses No LMP recorded.  Family Hx:  Family History  Problem Relation Age of Onset  . Lung cancer Father   . Diabetes Mellitus II Maternal Grandmother     Review of Systems:  Constitutional  Feels well,    ENT Normal appearing ears and nares bilaterally Skin/Breast  No rash, sores, jaundice, itching, dryness Cardiovascular  No chest pain, shortness of breath, or edema  Pulmonary  No cough or wheeze.  Gastro Intestinal  No nausea, vomitting, or diarrhoea. No bright red blood per rectum, no abdominal pain, change in bowel movement, or constipation.  Genito Urinary  No frequency, urgency, dysuria, + menorrhagia, + pelvic pain Musculo Skeletal  No myalgia, arthralgia, joint swelling or pain  Neurologic  No weakness, numbness, change in gait,  Psychology  No depression, anxiety, insomnia.   Vitals:  There were no vitals taken for this visit.  Physical Exam: WD in  NAD Neck  Supple NROM, without any enlargements.  Lymph Node Survey No cervical supraclavicular or inguinal adenopathy Cardiovascular  Pulse normal rate, regularity and rhythm. S1 and S2 normal.  Lungs  Clear to auscultation bilateraly, without wheezes/crackles/rhonchi. Good air movement.  Skin  No rash/lesions/breakdown  Psychiatry  Alert and oriented to person, place, and time  Abdomen  Normoactive bowel sounds, abdomen soft, non-tender and overweight without evidence of hernia.  Back No CVA tenderness Genito Urinary  Vulva/vagina: Normal external female genitalia.  No lesions. No discharge or bleeding.  Bladder/urethra:  No lesions or masses, well supported bladder  Vagina: normal  Cervix: Normal appearing, no lesions.  Uterus: The uterus is bulky but somewhat mobile. There is a densely adherent right adnexal mass that is immobile, and is cosistent with a right adnexa. On the left there is fullness but it is less discrete.  Adnexa: right adnexal masses. Rectal  Good tone, no masses no cul de sac nodularity.  Extremities  No bilateral cyanosis, clubbing or edema.   Donaciano Eva, MD  12/25/2015, 2:45 PM

## 2016-01-02 NOTE — Anesthesia Procedure Notes (Signed)
Procedure Name: Intubation Performed by: Gean Maidens Pre-anesthesia Checklist: Patient identified, Emergency Drugs available, Suction available, Patient being monitored and Timeout performed Patient Re-evaluated:Patient Re-evaluated prior to inductionOxygen Delivery Method: Circle system utilized Preoxygenation: Pre-oxygenation with 100% oxygen Intubation Type: IV induction Ventilation: Mask ventilation without difficulty Laryngoscope Size: Mac and 3 Grade View: Grade I Tube type: Oral Tube size: 7.0 mm Number of attempts: 1 Airway Equipment and Method: Stylet Placement Confirmation: ETT inserted through vocal cords under direct vision,  positive ETCO2,  CO2 detector and breath sounds checked- equal and bilateral Secured at: 22 cm Tube secured with: Tape Dental Injury: Teeth and Oropharynx as per pre-operative assessment

## 2016-01-02 NOTE — Transfer of Care (Signed)
Immediate Anesthesia Transfer of Care Note  Patient: Amy Lowe  Procedure(s) Performed: Procedure(s): XI ROBOTIC ASSISTED TOTAL HYSTERECTOMY WITH BILATERAL SALPINGO OOPHORECTOMY, LYSIS OF ADHESIONS, UTERUS GRADED AT 250 GRAMS (Bilateral) CYSTOSCOPY WITH RIGHT URETERAL STENT CHANGE AND RETROGRAGE (Right)  Patient Location: PACU  Anesthesia Type:General  Level of Consciousness: sedated, patient cooperative and responds to stimulation  Airway & Oxygen Therapy: Patient Spontanous Breathing and Patient connected to face mask oxygen  Post-op Assessment: Report given to RN and Post -op Vital signs reviewed and stable  Post vital signs: Reviewed and stable  Last Vitals:  Vitals:   01/02/16 1026  BP: (!) 142/80  Pulse: 93  Resp: 16  Temp: 37.2 C    Last Pain:  Vitals:   01/02/16 1058  TempSrc:   PainSc: 6       Patients Stated Pain Goal: 4 ([id number])  Complications: No apparent anesthesia complications

## 2016-01-02 NOTE — Anesthesia Preprocedure Evaluation (Addendum)
Anesthesia Evaluation  Patient identified by MRN, date of birth, ID band Patient awake    Reviewed: Allergy & Precautions, NPO status , Patient's Chart, lab work & pertinent test results  Airway Mallampati: I  TM Distance: >3 FB Neck ROM: Full    Dental  (+) Chipped, Dental Advisory Given,    Pulmonary Current Smoker,    breath sounds clear to auscultation       Cardiovascular hypertension,  Rhythm:Regular Rate:Normal     Neuro/Psych  Headaches, negative psych ROS   GI/Hepatic negative GI ROS, Neg liver ROS,   Endo/Other  diabetes, Type 2, Insulin Dependent  Renal/GU   negative genitourinary   Musculoskeletal  (+) Arthritis , Osteoarthritis,    Abdominal (+)  Abdomen: tender.    Peds negative pediatric ROS (+)  Hematology negative hematology ROS (+)   Anesthesia Other Findings   Reproductive/Obstetrics negative OB ROS                            Lab Results  Component Value Date   WBC 4.7 12/31/2015   HGB 8.0 (L) 12/31/2015   HCT 26.4 (L) 12/31/2015   MCV 67.2 (L) 12/31/2015   PLT 232 12/31/2015   Lab Results  Component Value Date   CREATININE 1.26 (H) 12/31/2015   BUN 14 12/31/2015   NA 138 12/31/2015   K 4.0 12/31/2015   CL 110 12/31/2015   CO2 22 12/31/2015   Lab Results  Component Value Date   INR 1.38 09/02/2015   08/2015 EKG: normal sinus rhythm.  Anesthesia Physical Anesthesia Plan  ASA: II  Anesthesia Plan: General   Post-op Pain Management:    Induction: Intravenous  Airway Management Planned: Oral ETT  Additional Equipment:   Intra-op Plan:   Post-operative Plan: Extubation in OR  Informed Consent: I have reviewed the patients History and Physical, chart, labs and discussed the procedure including the risks, benefits and alternatives for the proposed anesthesia with the patient or authorized representative who has indicated his/her understanding and  acceptance.   Dental advisory given  Plan Discussed with: CRNA  Anesthesia Plan Comments:         Anesthesia Quick Evaluation

## 2016-01-02 NOTE — Interval H&P Note (Signed)
History and Physical Interval Note:  01/02/2016 10:41 AM  Amy Lowe  has presented today for surgery, with the diagnosis of Ureteral Obstruction, Tubal Ovarian Abscesses, Fibroids, Pelvic Pain, Heavy Bleeding  The various methods of treatment have been discussed with the patient and family. After consideration of risks, benefits and other options for treatment, the patient has consented to  Procedure(s): XI ROBOTIC ASSISTED TOTAL HYSTERECTOMY WITH BILATERAL SALPINGO OOPHORECTOMY (Bilateral) CYSTOSCOPY WITH RIGHT URETERAL STENT CHANGE AND POSSIBLE RIGHT URETERAL REIMPLANTATION (Right) as a surgical intervention .  The patient's history has been reviewed, patient examined, no change in status, stable for surgery.  I have reviewed the patient's chart and labs. She was transfused 2 units of PRBC for Hb <6. Her preop Hb is 8.0. Questions were answered to the patient's satisfaction.     Donaciano Eva

## 2016-01-03 ENCOUNTER — Encounter (HOSPITAL_COMMUNITY): Payer: Self-pay | Admitting: Gynecologic Oncology

## 2016-01-03 DIAGNOSIS — N135 Crossing vessel and stricture of ureter without hydronephrosis: Secondary | ICD-10-CM | POA: Diagnosis not present

## 2016-01-03 LAB — BASIC METABOLIC PANEL
Anion gap: 7 (ref 5–15)
BUN: 11 mg/dL (ref 6–20)
CHLORIDE: 105 mmol/L (ref 101–111)
CO2: 23 mmol/L (ref 22–32)
Calcium: 9.1 mg/dL (ref 8.9–10.3)
Creatinine, Ser: 1.03 mg/dL — ABNORMAL HIGH (ref 0.44–1.00)
GFR calc non Af Amer: >60 mL/min (ref >60)
Glucose, Bld: 195 mg/dL — ABNORMAL HIGH (ref 65–99)
POTASSIUM: 4.3 mmol/L (ref 3.5–5.1)
SODIUM: 135 mmol/L (ref 135–145)

## 2016-01-03 LAB — CBC
HCT: 26.9 % — ABNORMAL LOW (ref 36.0–46.0)
HEMOGLOBIN: 8 g/dL — AB (ref 12.0–15.0)
MCH: 19.9 pg — AB (ref 26.0–34.0)
MCHC: 29.7 g/dL — ABNORMAL LOW (ref 30.0–36.0)
MCV: 66.7 fL — ABNORMAL LOW (ref 78.0–100.0)
Platelets: 255 10*3/uL (ref 150–400)
RBC: 4.03 MIL/uL (ref 3.87–5.11)
RDW: 23.3 % — ABNORMAL HIGH (ref 11.5–15.5)
WBC: 9.1 10*3/uL (ref 4.0–10.5)

## 2016-01-03 LAB — GLUCOSE, CAPILLARY: Glucose-Capillary: 256 mg/dL — ABNORMAL HIGH (ref 65–99)

## 2016-01-03 MED ORDER — LIP MEDEX EX OINT
TOPICAL_OINTMENT | CUTANEOUS | Status: AC
  Filled 2016-01-03: qty 7

## 2016-01-03 MED ORDER — ESTROGENS CONJUGATED 1.25 MG PO TABS: 1.2500 mg | ORAL_TABLET | Freq: Every day | ORAL | 6 refills | Status: DC

## 2016-01-03 MED ORDER — HYDROCODONE-IBUPROFEN 5-200 MG PO TABS
1.0000 | ORAL_TABLET | Freq: Four times a day (QID) | ORAL | 0 refills | Status: DC | PRN
Start: 2016-01-03 — End: 2016-01-14

## 2016-01-03 NOTE — Discharge Summary (Signed)
Physician Discharge Summary  Patient ID: Amy Lowe MRN: [id number] DOB/AGE: 41-Aug-1977 41 y.o.  Admit date: 01/02/2016 Discharge date: 01/03/2016  Admission Diagnoses: Fibroid uterus  Discharge Diagnoses:  Principal Problem:   Fibroid uterus Active Problems:   Diabetes mellitus type 2, controlled ([place])   Anemia   Hydrosalpinx   Chronic PID (chronic pelvic inflammatory disease)   Hydronephrosis of right kidney   Retroperitoneal fibrosis   Pelvic mass in female   Discharged Condition:  The patient is in good condition and stable for discharge.    Hospital Course: On 01/02/2016, the patient underwent the following: Procedure(s): XI ROBOTIC ASSISTED TOTAL HYSTERECTOMY WITH BILATERAL SALPINGO OOPHORECTOMY, LYSIS OF ADHESIONS, UTERUS GRADED AT 250 GRAMS CYSTOSCOPY WITH RIGHT URETERAL STENT CHANGE AND RETROGRAGE.   The postoperative course was uneventful.  She was discharged to home on postoperative day 1 tolerating a regular diet, ambulating, pain controlled with PRN medications, voiding.  Consults: None  Significant Diagnostic Studies: None  Treatments: surgery: see above  Discharge Exam: Blood pressure 134/63, pulse 96, temperature 98.2 F (36.8 C), temperature source Oral, resp. rate 15, height 5\' 4"  (1.626 m), weight 198 lb (89.8 kg), last menstrual period 12/10/2015, SpO2 100 %. General appearance: alert, cooperative and no distress Resp: clear to auscultation bilaterally Cardio: regular rate and rhythm, S1, S2 normal, no murmur, click, rub or gallop GI: soft, non-tender; bowel sounds normal; no masses,  no organomegaly and slightly distended, non-tympanic Extremities: extremities normal, atraumatic, no cyanosis or edema Incision/Wound: Lap sites to the abdomen with dermabond without erythema or drainage  Disposition: 01-Home or Self Care  Discharge Instructions    Call MD for:  difficulty breathing, headache or visual disturbances    Complete by:  As directed    Call  MD for:  extreme fatigue    Complete by:  As directed    Call MD for:  hives    Complete by:  As directed    Call MD for:  persistant dizziness or light-headedness    Complete by:  As directed    Call MD for:  persistant nausea and vomiting    Complete by:  As directed    Call MD for:  redness, tenderness, or signs of infection (pain, swelling, redness, odor or green/yellow discharge around incision site)    Complete by:  As directed    Call MD for:  severe uncontrolled pain    Complete by:  As directed    Call MD for:  temperature >100.4    Complete by:  As directed    Diet - low sodium heart healthy    Complete by:  As directed    Driving Restrictions    Complete by:  As directed    No driving for 1 week.  Do not take narcotics and drive.   Increase activity slowly    Complete by:  As directed    Lifting restrictions    Complete by:  As directed    No lifting greater than 10 lbs.   Sexual Activity Restrictions    Complete by:  As directed    No sexual activity, nothing in the vagina, for 8 weeks.       Medication List    STOP taking these medications   ibuprofen 200 MG tablet Commonly known as:  ADVIL,MOTRIN   megestrol 20 MG tablet Commonly known as:  MEGACE   tranexamic acid 650 MG Tabs tablet Commonly known as:  LYSTEDA     TAKE these medications  BAYER CONTOUR NEXT TEST test strip Generic drug:  glucose blood Use 3 times daily   BAYER MICROLET LANCETS lancets Use 3 times daily   cyclobenzaprine 10 MG tablet Commonly known as:  FLEXERIL Take 10 mg by mouth 2 (two) times daily as needed for muscle spasms.   diphenhydrAMINE 25 MG tablet Commonly known as:  BENADRYL Take 25 mg by mouth every 6 (six) hours as needed. For allergy symptom relief   estrogens (conjugated) 1.25 MG tablet Commonly known as:  PREMARIN Take 1 tablet (1.25 mg total) by mouth daily.   hydrocodone-ibuprofen 5-200 MG tablet Commonly known as:  VICOPROFEN Take 1-2 tablets by  mouth every 6 (six) hours as needed for pain.   hydrocortisone 2.5 % rectal cream Commonly known as:  ANUSOL-HC Place rectally 3 (three) times daily. What changed:  how much to take  when to take this  reasons to take this   insulin detemir 100 UNIT/ML injection Commonly known as:  LEVEMIR Inject 20 Units into the skin 2 (two) times daily.   insulin lispro 100 UNIT/ML injection Commonly known as:  HUMALOG Inject 6-22 Units into the skin 2 (two) times daily with a meal. 130-150 6 units, go up 2 units for every 50 mg/dl max 22 units   Melatonin 10 MG Tabs Take 10 mg by mouth at bedtime as needed (sleep).   nystatin-triamcinolone cream Commonly known as:  MYCOLOG II Apply topically 2 (two) times daily. What changed:  how much to take  when to take this  reasons to take this   polyethylene glycol packet Commonly known as:  MIRALAX / GLYCOLAX Take 17 g by mouth 2 (two) times daily.   traMADol 50 MG tablet Commonly known as:  ULTRAM Take 50 mg by mouth daily as needed for moderate pain.      Follow-up Information    BORDEN,LES, MD .   Specialty:  Urology Why:  02/04/16 at 10:45 for stent removal Contact information: [place] [address] [phone]        Donaciano Eva, MD Follow up on 01/14/2016.   Specialty:  Obstetrics and Gynecology Why:  at 2:30pm at the [facility] for post-operative follow up Contact information: [address] [place] [address] [phone]           Greater than thirty minutes were spend for face to face discharge instructions and discharge orders/summary in EPIC.   Signed: CROSS, MELISSA DEAL 01/03/2016, 10:49 AM

## 2016-01-03 NOTE — Progress Notes (Signed)
Patient ID: Amy Lowe, female   DOB: [birth year], 40 y.o.   MRN: [id number]  1 Day Post-Op Subjective: Pt doing well s/p stent replacement and hysterectomy yesterday. Denies complaints except for expected incisional tenderness.  Objective: Vital signs in last 24 hours: Temp:  [97.6 F (36.4 C)-98.9 F (37.2 C)] 97.8 F (36.6 C) (09/29 0444) Pulse Rate:  [74-97] 97 (09/29 0444) Resp:  [15-20] 16 (09/29 0444) BP: (137-153)/(60-80) 143/70 (09/29 0444) SpO2:  [97 %-100 %] 97 % (09/29 0444) Weight:  [89.8 kg (198 lb)] 89.8 kg (198 lb) (09/28 1058)  Intake/Output from previous day: 09/28 0701 - 09/29 0700 In: 2755 [P.O.:600; I.V.:2155] Out: 1500 [Urine:1450; Blood:50] Intake/Output this shift: No intake/output data recorded.  Physical Exam:  General: Alert and oriented  Lab Results:  Recent Labs  12/31/15 1250 01/03/16 0448  HGB 8.0* 8.0*  HCT 26.4* 26.9*   BMET  Recent Labs  12/31/15 1250 01/03/16 0448  NA 138 135  K 4.0 4.3  CL 110 105  CO2 22 23  GLUCOSE 118* 195*  BUN 14 11  CREATININE 1.26* 1.03*  CALCIUM 9.4 9.1     Studies/Results:   Assessment/Plan: Right hydroureteronephrosis with probable right ureteral obstruction - Intraoperative findings yesterday did not indicate evidence of intrinsic ureteral obstruction.  I am hopeful that obstruction of the right ureter will resolve following hysterectomy and excision of large fibroids.  Will plan to leave indwelling right ureteral stent and will remove at the end of October at planned office visit.  I have informed Amy Lowe of these findings and our plan and she expressed her understanding.     LOS: 1 day   Clara Herbison,LES 01/03/2016, 7:40 AM

## 2016-01-03 NOTE — Care Management Note (Signed)
Case Management Note  Patient Details  Name: Amy Lowe MRN: [id number] Date of Birth: 24-Dec-1975  Subjective/Objective: 40 y/o f admitted w/fibroid uterus. From home.                   Action/Plan:d/c home no needs or orders.   Expected Discharge Date:                  Expected Discharge Plan:  Home/Self Care  In-House Referral:     Discharge planning Services  CM Consult  Post Acute Care Choice:    Choice offered to:     DME Arranged:    DME Agency:     HH Arranged:    [place] Agency:     Status of Service:  Completed, signed off  If discussed at [employer] of Stay Meetings, dates discussed:    Additional Comments:  Dessa Phi, RN 01/03/2016, 12:35 PM

## 2016-01-03 NOTE — Discharge Instructions (Addendum)
01/03/2016  Return to work: 4-6 weeks if applicable  Activity: 1. Be up and out of the bed during the day.  Take a nap if needed.  You may walk up steps but be careful and use the hand rail.  Stair climbing will tire you more than you think, you may need to stop part way and rest.   2. No lifting or straining for 6 weeks.  3. No driving for 1 week(s).  Do not drive if you are taking narcotic pain medicine.  4. Shower daily.  Use soap and water on your incision and pat dry; don't rub.  No tub baths until cleared by your surgeon.   5. No sexual activity and nothing in the vagina for 8 weeks.  6. You may experience a small amount of clear drainage from your incisions, which is normal.  If the drainage persists or increases, please call the office.   Diet: 1. Low sodium Heart Healthy Diet is recommended.  2. It is safe to use a laxative, such as Miralax or Colace, if you have difficulty moving your bowels.   Wound Care: 1. Keep clean and dry.  Shower daily.  Reasons to call the Doctor:  Fever - Oral temperature greater than 100.4 degrees Fahrenheit  Foul-smelling vaginal discharge  Difficulty urinating  Nausea and vomiting  Increased pain at the site of the incision that is unrelieved with pain medicine.  Difficulty breathing with or without chest pain  New calf pain especially if only on one side  Sudden, continuing increased vaginal bleeding with or without clots.   Contacts: For questions or concerns you should contact:  Dr. Everitt Amber at [phone]  Joylene John, NP at [phone]  After Hours: call [phone] and have the GYN Oncologist paged/contacted  Hydrocodone; Ibuprofen tablets What is this medicine? HYDROCODONE; IBUPROFEN (hye droe KOE done; eye BYOO proe fen) is a pain reliever. It is used to treat moderate to severe pain. This medicine may be used for other purposes; ask your health care provider or pharmacist if you have questions. What should  I tell my health care provider before I take this medicine? They need to know if you have any of these conditions: -brain tumor -cigarette smoker -Crohn's disease, inflammatory bowel disease, or ulcerative colitis -drink more than 3 alcohol-containing drinks per day -drug abuse or addiction -head injury -heart disease or circulation problems such as heart failure or leg edema (fluid retention) -hemophilia or bleeding problems -high blood pressure -kidney disease or problems going to the bathroom -liver disease -lung disease, asthma, or breathing problems -stomach bleeding or ulcers -an unusual or allergic reaction to ibuprofen, hydrocodone, aspirin, other NSAIDs, other medicines, foods, dyes, or preservatives -pregnant or trying to get pregnant -breast-feeding How should I use this medicine? Take this medicine by mouth with a full glass of water. Follow the directions on the prescription label. You can take it with or without food. If it upsets your stomach, take it with food. Try to not lie down for at least 10 minutes after you take the medicine. Take your medicine at regular intervals. Do not take it more often than directed. A special MedGuide will be given to you by the pharmacist with each prescription and refill. Be sure to read this information carefully each time. Talk to your pediatrician regarding the use of this medicine in children. While this drug may be prescribed for children as young as 65 years of age for selected conditions, precautions do apply. Overdosage: If  you think you have taken too much of this medicine contact a poison control center or emergency room at once. NOTE: This medicine is only for you. Do not share this medicine with others. What if I miss a dose? If you miss a dose, take it as soon as you can. If it is almost time for your next dose, take only that dose. Do not take double or extra doses. What may interact with this  medicine? -alcohol -antihistamines -aspirin -cidofovir -diuretics -medicines for depression, anxiety, or psychotic disturbances -medicines for sleep -methotrexate -muscle relaxants -naltrexone -narcotic medicines (opiates) for pain -other drugs for inflammation like ketorolac or prednisone -pemetrexed -phenobarbital -ritonavir -tramadol -warfarin This list may not describe all possible interactions. Give your health care provider a list of all the medicines, herbs, non-prescription drugs, or dietary supplements you use. Also tell them if you smoke, drink alcohol, or use illegal drugs. Some items may interact with your medicine. What should I watch for while using this medicine? Tell your doctor or health care professional if your pain does not go away, if it gets worse, or if you have new or a different type of pain. You may develop tolerance to the medicine. Tolerance means that you will need a higher dose of the medicine for pain relief. Tolerance is normal and is expected if you take this medicine for a long time. Do not suddenly stop taking this medicine because you may develop a severe reaction. Your body becomes used to this medicine. This does NOT mean you are addicted. Addiction is a behavior related to getting and using a drug for a non-medical reason. If you have pain, you have a medical reason to take pain medicine. Your doctor will tell you how much medicine to take. If your doctor wants you to stop this medicine, the dose will be slowly lowered over time to avoid any side effects. There are different types of narcotic medicines (opiates) for pain. If you take more than one type at the same time, you may have more side effects. Give your health care provider a list of all medicines you use. Your doctor will tell you how much medicine to take. Do not take more medicine than directed. Call emergency for help if you have problems breathing. This medicine will cause constipation. Try  to have a bowel movement at least every 2 to 3 days. If you do not have a bowel movement for 3 days, call your doctor or health care professional. This medicine does not prevent heart attack or stroke. In fact, this medicine may increase the chance of a heart attack or stroke. The chance may increase with longer use of this medicine and in people who have heart disease. If you take aspirin to prevent heart attack or stroke, talk with your doctor or health care professional. Do not take naproxen with this medicine. Side effects such as stomach upset, nausea, or ulcers may be more likely to occur. Many medicines available without a prescription should not be taken with this medicine. This medicine can cause ulcers and bleeding in the stomach and intestines at any time during treatment. Do not smoke cigarettes or drink alcohol. These increase irritation to your stomach and can make it more susceptible to damage from this medicine. Ulcers and bleeding can happen without warning symptoms and can cause death. You may get drowsy or dizzy. Do not drive, use machinery, or do anything that needs mental alertness until you know how this medicine affects you. Do  not stand or sit up quickly, especially if you are an older patient. This reduces the risk of dizzy or fainting spells. This medicine can cause you to bleed more easily. Try to avoid damage to your teeth and gums when you brush or floss your teeth. What side effects may I notice from receiving this medicine? Side effects that you should report to your doctor or health care professional as soon as possible: -confusion -black or bloody stools, blood in the urine or vomit -blurred vision -chest pain -difficulty breathing or wheezing -skin rash, skin redness, blistering or peeling skin, hives, or itching -slurred speech or weakness on one side of the body -swelling of eyelids, throat, lips -unexplained weight gain or swelling -unusually weak or  tired -yellowing of eyes or skin -vomiting Side effects that usually do not require medical attention (report to your doctor or health care professional if they continue or are bothersome): -headache -heartburn -nausea This list may not describe all possible side effects. Call your doctor for medical advice about side effects. You may report side effects to FDA at 1-800-FDA-1088. Where should I keep my medicine? Keep out of the reach of children. This medicine can be abused. Keep your medicine in a safe place to protect it from theft. Do not share this medicine with anyone. Selling or giving away this medicine is dangerous and against the law. This medicine may cause accidental overdose and death if it taken by other adults, children, or pets. Mix any unused medicine with a substance like cat litter or coffee grounds. Then throw the medicine away in a sealed container like a sealed bag or a coffee can with a lid. Do not use the medicine after the expiration date. Store at room temperature between 15 and 30 degrees C (59 and 86 degrees F). NOTE: This sheet is a summary. It may not cover all possible information. If you have questions about this medicine, talk to your doctor, pharmacist, or health care provider.    2016, [employer] Standard. (2013-05-19 15:29:37)  Conjugated Estrogens tablets What is this medicine? CONJUGATED ESTROGENS (CON ju gate ed ESS troe jenz) is an estrogen. It is used as hormone replacement in menopausal women. It helps to treat hot flashes and prevent osteoporosis. It is also used to treat women with low hormone levels or in those who have had their ovaries removed. This medicine may be used for other purposes; ask your health care provider or pharmacist if you have questions. What should I tell my health care provider before I take this medicine? They need to know if you have any of these conditions: -abnormal vaginal bleeding -blood vessel disease or blood  clots -breast, cervical, endometrial, ovarian, liver, or uterine cancer -dementia -diabetes -endometriosis -fibroids -gallbladder disease -heart disease or recent heart attack -high blood pressure -high cholesterol -high level of calcium in the blood -kidney disease -liver disease -mental depression -migraine headaches -protein C deficiency -protein S deficiency -stroke -tobacco smoker -an unusual or allergic reaction to estrogens, other medicines, foods, dyes, or preservatives -pregnant or trying to get pregnant -breast-feeding How should I use this medicine? Take this medicine by mouth with a glass of water. Follow the directions on the prescription label. Take your medicine at regular intervals, at the same time each day. Do not take your medicine more often than directed. A patient package insert for the product will be given with each prescription and refill. Read this sheet carefully each time. Talk to your pediatrician regarding the use  of this medicine in children. This medicine is not approved for use in children. Overdosage: If you think you have taken too much of this medicine contact a poison control center or emergency room at once. NOTE: This medicine is only for you. Do not share this medicine with others. What if I miss a dose? If you miss a dose, take it as soon as you can. If it is almost time for your next dose, take only that dose. Do not take double or extra doses. What may interact with this medicine? Do not take this medicine with any of the following medications: -aromatase inhibitors like aminoglutethimide, anastrozole, exemestane, letrozole, testolactone -metyrapone This medicine may also interact with the following medications: -barbiturates, such as phenobarbital -carbamazepine -clarithromycin -erythromycin -grapefruit juice -medicines for fungal infections like ketoconazole and itraconazole -phenytoin - rifampin -ritonavir -St. John's  Wort -thyroid hormones This list may not describe all possible interactions. Give your health care provider a list of all the medicines, herbs, non-prescription drugs, or dietary supplements you use. Also tell them if you smoke, drink alcohol, or use illegal drugs. Some items may interact with your medicine. What should I watch for while using this medicine? Visit your health care professional for regular checks on your progress. You will need a regular breast and pelvic exam and Pap smear while on this medicine. You should also discuss the need for regular mammograms with your health care professional, and follow his or her guidelines for these tests. This medicine can make your body retain fluid, making your fingers, hands, or ankles swell. Your blood pressure can go up. Contact your doctor or health care professional if you feel you are retaining fluid. If you have any reason to think you are pregnant; stop taking this medicine at once and contact your doctor or health care professional. Smoking increases the risk of getting a blood clot or having a stroke while you are taking this medicine, especially if you are more than 41 years old. You are strongly advised not to smoke. If you wear contact lenses and notice visual changes, or if the lenses begin to feel uncomfortable, consult your eye care specialist. The tablet shell for some brands of this medicine does not dissolve. The tablet shell may appear whole in the stool. This is not a cause for concern. If you see something that resembles a tablet in your stool, talk to your healthcare provider. This medicine can increase the risk of developing a condition (endometrial hyperplasia) that may lead to cancer of the lining of the uterus. Taking progestins, another hormone drug, with this medicine lowers the risk of developing this condition. Therefore, if your uterus has not been removed (by a hysterectomy), your doctor may prescribe a progestin for you to  take together with your estrogen. You should know, however, that taking estrogens with progestins may have additional health risks. You should discuss the use of estrogens and progestins with your health care professional to determine the benefits and risks for you. If you are going to have surgery, you may need to stop taking this medicine. Consult your health care professional for advice before you schedule the surgery. What side effects may I notice from receiving this medicine? Side effects that you should report to your doctor or health care professional as soon as possible: -allergic reactions like skin rash, itching or hives, swelling of the face, lips, or tongue -breast tissue changes or discharge -changes in vision -chest pain -confusion, trouble speaking or understanding -dark  urine -general ill feeling or flu-like symptoms -light-colored stools -nausea, vomiting -pain, swelling, warmth in the leg -right upper belly pain -severe headaches -shortness of breath -sudden numbness or weakness of the face, arm or leg -trouble walking, dizziness, loss of balance or coordination -unusual vaginal bleeding -yellowing of the eyes or skin Side effects that usually do not require medical attention (report to your doctor or health care professional if they continue or are bothersome): -hair loss -increased hunger or thirst -increased urination -symptoms of vaginal infection like itching, irritation or unusual discharge -unusually weak or tired This list may not describe all possible side effects. Call your doctor for medical advice about side effects. You may report side effects to FDA at 1-800-FDA-1088. Where should I keep my medicine? Keep out of the reach of children. Store at room temperature between 15 and 30 degrees C (59 and 86 degrees F). Throw away any unused medicine after the expiration date. NOTE: This sheet is a summary. It may not cover all possible information. If you have  questions about this medicine, talk to your doctor, pharmacist, or health care provider.    2016, [employer] Standard. (2010-06-25 09:20:56)

## 2016-01-14 ENCOUNTER — Ambulatory Visit: Payer: BLUE CROSS/BLUE SHIELD | Attending: Gynecologic Oncology | Admitting: Gynecologic Oncology

## 2016-01-14 ENCOUNTER — Encounter: Payer: Self-pay | Admitting: Gynecologic Oncology

## 2016-01-14 VITALS — BP 128/55 | HR 114 | Temp 98.2°F | Resp 18 | Ht 64.0 in | Wt 190.4 lb

## 2016-01-14 DIAGNOSIS — R109 Unspecified abdominal pain: Secondary | ICD-10-CM | POA: Diagnosis not present

## 2016-01-14 DIAGNOSIS — K5903 Drug induced constipation: Secondary | ICD-10-CM | POA: Diagnosis not present

## 2016-01-14 DIAGNOSIS — Z90710 Acquired absence of both cervix and uterus: Secondary | ICD-10-CM | POA: Diagnosis not present

## 2016-01-14 DIAGNOSIS — F17210 Nicotine dependence, cigarettes, uncomplicated: Secondary | ICD-10-CM | POA: Insufficient documentation

## 2016-01-14 DIAGNOSIS — N739 Female pelvic inflammatory disease, unspecified: Secondary | ICD-10-CM | POA: Insufficient documentation

## 2016-01-14 DIAGNOSIS — Z794 Long term (current) use of insulin: Secondary | ICD-10-CM | POA: Insufficient documentation

## 2016-01-14 DIAGNOSIS — Z6832 Body mass index (BMI) 32.0-32.9, adult: Secondary | ICD-10-CM | POA: Insufficient documentation

## 2016-01-14 DIAGNOSIS — E119 Type 2 diabetes mellitus without complications: Secondary | ICD-10-CM | POA: Insufficient documentation

## 2016-01-14 DIAGNOSIS — D500 Iron deficiency anemia secondary to blood loss (chronic): Secondary | ICD-10-CM | POA: Diagnosis not present

## 2016-01-14 DIAGNOSIS — K5900 Constipation, unspecified: Secondary | ICD-10-CM | POA: Insufficient documentation

## 2016-01-14 DIAGNOSIS — N644 Mastodynia: Secondary | ICD-10-CM | POA: Insufficient documentation

## 2016-01-14 DIAGNOSIS — E663 Overweight: Secondary | ICD-10-CM | POA: Diagnosis not present

## 2016-01-14 DIAGNOSIS — N730 Acute parametritis and pelvic cellulitis: Secondary | ICD-10-CM

## 2016-01-14 DIAGNOSIS — Z79899 Other long term (current) drug therapy: Secondary | ICD-10-CM | POA: Insufficient documentation

## 2016-01-14 MED ORDER — HYDROCODONE-IBUPROFEN 5-200 MG PO TABS: 1.0000 | ORAL_TABLET | Freq: Four times a day (QID) | ORAL | 0 refills | Status: DC | PRN

## 2016-01-14 MED ORDER — ESTROGENS CONJUGATED 0.9 MG PO TABS: 0.9000 mg | ORAL_TABLET | Freq: Every day | ORAL | 11 refills | Status: DC

## 2016-01-14 MED ORDER — SENNA 8.6 MG PO TABS: 1.0000 | ORAL_TABLET | Freq: Every day | ORAL | 0 refills | Status: AC

## 2016-01-14 NOTE — Patient Instructions (Signed)
Plan to have an ultrasound of the kidney to evaluate the pain and swelling you are having.  Begin taking premarin 0.9 mg tablets once daily.  Plan to follow up with Nel[facility] Bush for your well woman care and Dr. Denman George for any issues with healing from surgery.

## 2016-01-14 NOTE — Progress Notes (Signed)
Follow-up Note: Gyn-Onc  Consult was initially requested by Dr. Nelva Bush for the evaluation of Amy Lowe 40 y.o. female  CC:  Chief Complaint  Patient presents with  . PID (acute pelvic inflammatory disease )    Follow up visit    Assessment/Plan:  Amy Lowe  is a 40 y.o.  year old with a history of TOA's and fibroid uterus and symptomatic anemia (chronic blood loss). She is s/p robotic assisted total hysterectomy, BSO and lysis of adhesions with exchange of right ureteral stent and ureteroscopy on 01/02/16.  She is doing very well postoperatively.  She has constipation - recommended nightly sennakot while using vicodin.  She has left flank pain - recommend [place] left kidney to rule out hydronephrosis.   She feels ready to return to work. I feel that she is medically safe to do this on 01/20/16.  She will return to see Dr Alinda Money for office-based right ureteral stent removal later in October as scheduled.  We decreased the dose of her premarin as she is having some breast tenderness.  I will see her on a prn basis, and she will follow-up with Dr Earleen Newport on an annual basis.  HPI: Amy Lowe is a 40 year old G0 who is seen at the request of Dr. Nelva Bush for pelvic inflammatory disease, tubo-ovarian abscesses, menorrhagia, uterine fibroids, and multiple medical comorbidities. The patient has most recently been admitted to [facility] in June 2017. She was admitted with tubo-ovarian abscess and sepsis and bilateral hydronephrosis. She was treated with an INR directed trans-gluteal drain into the pelvic abscesses, IV antibiotics, bilateral ureteral stent placement, and blood transfusion for a critical hemoglobin of 3.8. This anemia was associated with an likely caused by heavy menorrhagia from uterine fibroids. As a result she was also started on Lysteda 650mg  2 tablets by mouth 3 times a day and Megace 40 mg by mouth twice a day. This helps slow down her bleeding  somewhat. She was also transfused up from hemoglobin of 3.8 to a discharge hemoglobin of 7.5.  She is discharged home with a transgluteal drain in place but this has subsequently been removed due to clinical improvement and adequacy of drainage. Of note at the time of drain removal other pockets of undrained fluid and residual hydrosalpinx remained in the pelvis.  The patient's complex pelvic history began at age 40 when she was diagnosed with pelvic inflammatory disease and admitted to [facility] in [facility] where she was treated with what sounds like a conservative management with IV antibiotics only and no surgical intervention. She also has a history of menorrhagia for her entire menstrual life. However in recent years this is become substantially heavier. She also reports cyclic pain in the pelvis particularly around the time of menses. She is undergone several laparoscopies most recently in 2014. These were performed due to menorrhagia and pelvic pain. They revealed multiple adhesions between small and large intestine and grossly dilated tubes and ovaries bilaterally. Her symptoms have been managed as an outpatient up until this recent admission to [facility] in June 2017.  For several years she's been recommended to undergo hysterectomy due to her known tubo-ovarian abscesses, severe pelvic inflammatory disease, and symptomatic menorrhagia and fibroid uterus. She declined this due to strong desire for future fertility. She is also extremely concerned about passing through menopause at an early age.  In addition to her complex gynecologic history the patient is also overweight with a BMI  of 32.4 kg meters squared. She has a history of type 2 diabetes mellitus and for this she takes insulin. Her endocrinologist is Dr. Posey Pronto. She states that her most recent HbA1c was 6.8 but this may have been as long ago as January 2017. She denies taking all of her prescribed insulin medication and  therefore during her recent hospitalization noticed hypoglycemic episodes and she was been prescribed and administered all of her insulin doses. She rarely checks her blood glucose at home despite her need for insulin use.  The patient has a history of tobacco use. She chronically takes narcotics for pelvic pain.   She was started on Depot Lupron to suppress menses in June, 2017. This induced some decrease in bleeding, however she still persistently bled daily..  Interval Hx:  On 01/02/16 she underwent robotic assisted total hysterectomy, BSO, lysis of adhesions for a uterus >250gm and Dr Alinda Money performed cystoscopy, ureteroscopy and exchange of right ureteral stent. Surgery was uncomplicated. She was discharged on POD 1. She has been doing well since with the exception of constipation relieved by Milk of Magnesium. She also notes some left flank pain and fullness. She reports tenderness in her breasts which she attributes to premarin.  Current Meds:  Outpatient Encounter Prescriptions as of 01/14/2016  Medication Sig  . BAYER MICROLET LANCETS lancets Use 3 times daily  . diphenhydrAMINE (BENADRYL) 25 MG tablet Take 25 mg by mouth every 6 (six) hours as needed. For allergy symptom relief  . estrogens, conjugated, (PREMARIN) 0.9 MG tablet Take 1 tablet (0.9 mg total) by mouth daily.  Marland Kitchen glucose blood (BAYER CONTOUR NEXT TEST) test strip Use 3 times daily  . hydrocodone-ibuprofen (VICOPROFEN) 5-200 MG tablet Take 1-2 tablets by mouth every 6 (six) hours as needed for pain.  . hydrocortisone (ANUSOL-HC) 2.5 % rectal cream Place rectally 3 (three) times daily. (Patient taking differently: Place 1 application rectally daily as needed for hemorrhoids. )  . insulin detemir (LEVEMIR) 100 UNIT/ML injection Inject 20 Units into the skin 2 (two) times daily.   . insulin lispro (HUMALOG) 100 UNIT/ML injection Inject 6-22 Units into the skin 2 (two) times daily with a meal. 130-150 6 units, go up 2 units for  every 50 mg/dl max 22 units  . nystatin-triamcinolone (MYCOLOG II) cream Apply topically 2 (two) times daily. (Patient taking differently: Apply 1 application topically 2 (two) times daily as needed (itching). )  . polyethylene glycol (MIRALAX / GLYCOLAX) packet Take 17 g by mouth 2 (two) times daily.  . [DISCONTINUED] estrogens, conjugated, (PREMARIN) 1.25 MG tablet Take 1 tablet (1.25 mg total) by mouth daily.  . [DISCONTINUED] hydrocodone-ibuprofen (VICOPROFEN) 5-200 MG tablet Take 1-2 tablets by mouth every 6 (six) hours as needed for pain.  . cyclobenzaprine (FLEXERIL) 10 MG tablet Take 10 mg by mouth 2 (two) times daily as needed for muscle spasms.   . Melatonin 10 MG TABS Take 10 mg by mouth at bedtime as needed (sleep).  . senna (SENOKOT) 8.6 MG TABS tablet Take 1 tablet (8.6 mg total) by mouth at bedtime.  . traMADol (ULTRAM) 50 MG tablet Take 50 mg by mouth daily as needed for moderate pain.   No facility-administered encounter medications on file as of 01/14/2016.     Allergy: PCN, iodinated contrast agents  Social Hx:   Social History   Social History  . Marital status: Divorced    Spouse name: N/A  . Number of children: N/A  . Years of education: N/A  Occupational History  . Not on file.   Social History Main Topics  . Smoking status: Current Some Day Smoker    Packs/day: 0.25    Types: Cigarettes, E-cigarettes  . Smokeless tobacco: Never Used  . Alcohol use Yes     Comment: occ  . Drug use: No  . Sexual activity: Yes   Other Topics Concern  . Not on file   Social History Narrative  . No narrative on file    Past Surgical Hx:  Past Surgical History:  Procedure Laterality Date  . abdominal laparascopy  2010  . CYSTOSCOPY W/ URETERAL STENT PLACEMENT Right 09/05/2015   Procedure: CYSTO WITH RIGHT RETROGRADE AND STENT PLACEMENT;  Surgeon: Irine Seal, MD;  Location: [facility];  Service: Urology;  Laterality: Right;  . CYSTOSCOPY WITH STENT PLACEMENT Right  01/02/2016   Procedure: CYSTOSCOPY WITH RIGHT URETERAL STENT CHANGE AND RETROGRAGE;  Surgeon: Raynelle Bring, MD;  Location: [facility];  Service: Urology;  Laterality: Right;  . DILATION AND CURETTAGE OF UTERUS    . DNC    . HERNIA REPAIR  age 39  . ROBOTIC ASSISTED TOTAL HYSTERECTOMY WITH BILATERAL SALPINGO OOPHERECTOMY Bilateral 01/02/2016   Procedure: XI ROBOTIC ASSISTED TOTAL HYSTERECTOMY WITH BILATERAL SALPINGO OOPHORECTOMY, LYSIS OF ADHESIONS, UTERUS GRADED AT 250 GRAMS;  Surgeon: Everitt Amber, MD;  Location: [facility];  Service: Gynecology;  Laterality: Bilateral;    Past Medical Hx:  Past Medical History:  Diagnosis Date  . Anemia   . Arthritis    mild on knees  . Complication of anesthesia    slow at awaken from anesthesia  . Diabetes mellitus   . Family history of adverse reaction to anesthesia    mother slow to awaken from anesthesia  . Headache    migraines yrs ago    Past Gynecological History:  PID diagnosed at age 69. Menorrhagia. Fibroids. Tubo-ovarian abscesses Patient's last menstrual period was 12/10/2015.  Family Hx:  Family History  Problem Relation Age of Onset  . Lung cancer Father   . Diabetes Mellitus II Maternal Grandmother     Review of Systems:  Constitutional  Feels well,    ENT Normal appearing ears and nares bilaterally Skin/Breast  No rash, sores, jaundice, itching, dryness Cardiovascular  No chest pain, shortness of breath, or edema  Pulmonary  No cough or wheeze.  Gastro Intestinal  No nausea, vomitting, or diarrhoea. No bright red blood per rectum, no abdominal pain, change in bowel movement, or constipation.  Genito Urinary  No frequency, urgency, dysuria, no bleeding Musculo Skeletal  + left flank pain Neurologic  No weakness, numbness, change in gait,  Psychology  No depression, anxiety, insomnia.   Vitals:  Blood pressure (!) 128/55, pulse (!) 114, temperature 98.2 F (36.8 C), temperature source Oral, resp. rate 18, height 5\' 4"   (1.626 m), weight 190 lb 6.4 oz (86.4 kg), last menstrual period 12/10/2015, SpO2 100 %.  Physical Exam: WD in NAD Neck  Supple NROM, without any enlargements.  Lymph Node Survey No cervical supraclavicular or inguinal adenopathy Cardiovascular  Pulse normal rate, regularity and rhythm. S1 and S2 normal.  Lungs  Clear to auscultation bilateraly, without wheezes/crackles/rhonchi. Good air movement.  Skin  No rash/lesions/breakdown  Psychiatry  Alert and oriented to person, place, and time  Abdomen  Normoactive bowel sounds, abdomen soft, non-tender and overweight without evidence of hernia. Well healed incisions. Back + left CVA tenderness and fullness in paraspinal muscles. Genito Urinary  Vulva/vagina: Normal external female genitalia.  No lesions. No discharge or bleeding.  Bladder/urethra:  No lesions or masses, well supported bladder  Vagina: cuff in tact with no discharge or lesions.  Cervix: surgically absent  Uterus: surgically absent.  Adnexa: no masses Rectal  Good tone, no masses no cul de sac nodularity.  Extremities  No bilateral cyanosis, clubbing or edema.   Donaciano Eva, MD  01/14/2016, 5:14 PM

## 2016-01-16 ENCOUNTER — Ambulatory Visit: Payer: BLUE CROSS/BLUE SHIELD | Admitting: Gynecologic Oncology

## 2016-01-17 ENCOUNTER — Ambulatory Visit (HOSPITAL_COMMUNITY)
Admission: RE | Admit: 2016-01-17 | Discharge: 2016-01-17 | Disposition: A | Discharge status: Home / Self Care | Payer: BLUE CROSS/BLUE SHIELD | Source: Ambulatory Visit | Attending: Gynecologic Oncology | Admitting: Gynecologic Oncology

## 2016-01-17 DIAGNOSIS — R109 Unspecified abdominal pain: Secondary | ICD-10-CM

## 2016-01-17 DIAGNOSIS — N1330 Unspecified hydronephrosis: Secondary | ICD-10-CM | POA: Diagnosis not present

## 2016-01-22 ENCOUNTER — Telehealth: Payer: Self-pay

## 2016-01-22 NOTE — Telephone Encounter (Signed)
Call returned to the patient to discuss "vaginal" discharge with slight odor ,intermittent cramping with spotting . Patient states tghat spotting is "red" in color and turns "yellow" . Patient states that the discharge has diminished now. Patient also states that she took a "diflucan" pill just because she thought she needed to. Writer encouraged the patient not to take any medications without the direction of a physician . Patient denies fever at this time. Patient states understanding. Melissa Cross, APNP updated , orders received to have the patient continue to monitor and call with any additional changes , questions or concerns.

## 2016-01-29 ENCOUNTER — Encounter: Payer: Self-pay | Admitting: Gynecologic Oncology

## 2016-06-29 ENCOUNTER — Other Ambulatory Visit (HOSPITAL_COMMUNITY): Payer: Self-pay | Admitting: Urology

## 2016-06-29 DIAGNOSIS — N1330 Unspecified hydronephrosis: Secondary | ICD-10-CM

## 2016-07-10 ENCOUNTER — Encounter (HOSPITAL_COMMUNITY): Payer: Self-pay | Admitting: Radiology

## 2016-07-10 ENCOUNTER — Ambulatory Visit (HOSPITAL_COMMUNITY)
Admission: RE | Admit: 2016-07-10 | Discharge: 2016-07-10 | Disposition: A | Discharge status: Home / Self Care | Payer: BLUE CROSS/BLUE SHIELD | Source: Ambulatory Visit | Attending: Urology | Admitting: Urology

## 2016-07-10 DIAGNOSIS — N1330 Unspecified hydronephrosis: Secondary | ICD-10-CM | POA: Insufficient documentation

## 2016-07-10 MED ORDER — TECHNETIUM TC 99M MERTIATIDE
5.0100 | Freq: Once | INTRAVENOUS | Status: AC | PRN
  Administered 2016-07-10: 5.01 via INTRAVENOUS

## 2016-07-10 MED ORDER — FUROSEMIDE 10 MG/ML IJ SOLN
INTRAMUSCULAR | Status: AC
  Filled 2016-07-10: qty 8

## 2016-07-10 MED ORDER — FUROSEMIDE 10 MG/ML IJ SOLN
43.0000 mg | Freq: Once | INTRAMUSCULAR | Status: AC
  Administered 2016-07-10: 43 mg via INTRAVENOUS

## 2016-07-16 ENCOUNTER — Other Ambulatory Visit: Payer: Self-pay | Admitting: Urology

## 2016-07-21 NOTE — Patient Instructions (Signed)
ZANAYA BAIZE  07/21/2016   Your procedure is scheduled on: 07/23/16  Report to [facility] Main  Entrance Take [place]  elevators to 3rd floor to  [place] at    0730 AM.    Call this number if you have problems the morning of surgery [phone]    Remember: ONLY 1 PERSON MAY GO WITH YOU TO SHORT STAY TO GET  READY MORNING OF [place].  Do not eat food or drink liquids :After Midnight.     Take these medicines the morning of surgery with A SIP OF WATER: none  How to Manage Your Diabetes Before and After Surgery  Why is it important to control my blood sugar before and after surgery? . Improving blood sugar levels before and after surgery helps healing and can limit problems. . A way of improving blood sugar control is eating a healthy diet by: o  Eating less sugar and carbohydrates o  Increasing activity/exercise o  Talking with your doctor about reaching your blood sugar goals . High blood sugars (greater than 180 mg/dL) can raise your risk of infections and slow your recovery, so you will need to focus on controlling your diabetes during the weeks before surgery. . Make sure that the doctor who takes care of your diabetes knows about your planned surgery including the date and location.  How do I manage my blood sugar before surgery? . Check your blood sugar at least 4 times a day, starting 2 days before surgery, to make sure that the level is not too high or low. o Check your blood sugar the morning of your surgery when you wake up and every 2 hours until you get to the Short Stay unit. . If your blood sugar is less than 70 mg/dL, you will need to treat for low blood sugar: o Do not take insulin. o Treat a low blood sugar (less than 70 mg/dL) with  cup of clear juice (cranberry or apple), 4 glucose tablets, OR glucose gel. o Recheck blood sugar in 15 minutes after treatment (to make sure it is greater than 70 mg/dL). If your blood sugar is  not greater than 70 mg/dL on recheck, call [phone] for further instructions. . Report your blood sugar to the short stay nurse when you get to Short Stay.  . If you are admitted to the hospital after surgery: o Your blood sugar will be checked by the staff and you will probably be given insulin after surgery (instead of oral diabetes medicines) to make sure you have good blood sugar levels. o The goal for blood sugar control after surgery is 80-180 mg/dL.   WHAT DO I DO ABOUT MY DIABETES MEDICATION?   Marland Kitchen Do not take oral diabetes medicines (pills) the morning of surgery.  . THE NIGHT BEFORE SURGERY,   take   50 % of   Your  levemir  insulin.   No bedtime Humalog        . THE MORNING OF SURGERY, take    0       insulin.  . The day of surgery, do not take other diabetes injectables, including Byetta (exenatide), Bydureon (exenatide ER), Victoza (liraglutide), or Trulicity (dulaglutide).  . If your CBG is greater than 220 mg/dL, you may take  of your sliding scale  (correction) dose of insulin.  Patient Signature:  Date:   Nurse Signature:  Date:   Reviewed and Endorsed by [facility] Patient Education Committee, August 2015                                You may not have any metal on your body including hair pins and              piercings  Do not wear jewelry, make-up, lotions, powders or perfumes, deodorant             Do not wear nail polish.  Do not shave  48 hours prior to surgery.        .   Do not bring valuables to the hospital. [place].  Contacts, dentures or bridgework may not be worn into surgery.     Patients discharged the day of surgery will not be allowed to drive home.  Name and phone number of your driver:  Special Instructions: N/A              Please read over the following fact sheets you were given: _____________________________________________________________________             [facility] -  Preparing for Surgery Before surgery, you can play an important role.  Because skin is not sterile, your skin needs to be as free of germs as possible.  You can reduce the number of germs on your skin by washing with CHG (chlorahexidine gluconate) soap before surgery.  CHG is an antiseptic cleaner which kills germs and bonds with the skin to continue killing germs even after washing. Please DO NOT use if you have an allergy to CHG or antibacterial soaps.  If your skin becomes reddened/irritated stop using the CHG and inform your nurse when you arrive at Short Stay. Do not shave (including legs and underarms) for at least 48 hours prior to the first CHG shower.  You may shave your face/neck. Please follow these instructions carefully:  1.  Shower with CHG Soap the night before surgery and the  morning of Surgery.  2.  If you choose to wash your hair, wash your hair first as usual with your  normal  shampoo.  3.  After you shampoo, rinse your hair and body thoroughly to remove the  shampoo.                           4.  Use CHG as you would any other liquid soap.  You can apply chg directly  to the skin and wash                       Gently with a scrungie or clean washcloth.  5.  Apply the CHG Soap to your body ONLY FROM THE NECK DOWN.   Do not use on face/ open                           Wound or open sores. Avoid contact with eyes, ears mouth and genitals (private parts).                       Wash face,  Genitals (private parts) with your normal soap.  6.  Wash thoroughly, paying special attention to the area where your surgery  will be performed.  7.  Thoroughly rinse your body with warm water from the neck down.  8.  DO NOT shower/wash with your normal soap after using and rinsing off  the CHG Soap.                9.  Pat yourself dry with a clean towel.            10.  Wear clean pajamas.            11.  Place clean sheets on your bed the night of your first shower and do not  sleep  with pets. Day of Surgery : Do not apply any lotions/deodorants the morning of surgery.  Please wear clean clothes to the hospital/surgery center.  FAILURE TO FOLLOW THESE INSTRUCTIONS MAY RESULT IN THE CANCELLATION OF YOUR SURGERY PATIENT SIGNATURE_________________________________  NURSE SIGNATURE__________________________________  ________________________________________________________________________

## 2016-07-22 ENCOUNTER — Encounter (HOSPITAL_COMMUNITY): Payer: Self-pay

## 2016-07-22 ENCOUNTER — Encounter (HOSPITAL_COMMUNITY)
Admission: RE | Admit: 2016-07-22 | Discharge: 2016-07-22 | Disposition: A | Discharge status: Home / Self Care | Payer: BLUE CROSS/BLUE SHIELD | Source: Ambulatory Visit | Attending: Urology | Admitting: Urology

## 2016-07-22 DIAGNOSIS — Z880 Allergy status to penicillin: Secondary | ICD-10-CM | POA: Diagnosis not present

## 2016-07-22 DIAGNOSIS — I10 Essential (primary) hypertension: Secondary | ICD-10-CM | POA: Diagnosis not present

## 2016-07-22 DIAGNOSIS — Z9889 Other specified postprocedural states: Secondary | ICD-10-CM | POA: Diagnosis not present

## 2016-07-22 DIAGNOSIS — M1990 Unspecified osteoarthritis, unspecified site: Secondary | ICD-10-CM | POA: Diagnosis not present

## 2016-07-22 DIAGNOSIS — E119 Type 2 diabetes mellitus without complications: Secondary | ICD-10-CM | POA: Diagnosis not present

## 2016-07-22 DIAGNOSIS — Z90710 Acquired absence of both cervix and uterus: Secondary | ICD-10-CM | POA: Diagnosis not present

## 2016-07-22 DIAGNOSIS — Z79899 Other long term (current) drug therapy: Secondary | ICD-10-CM | POA: Diagnosis not present

## 2016-07-22 DIAGNOSIS — F329 Major depressive disorder, single episode, unspecified: Secondary | ICD-10-CM | POA: Diagnosis not present

## 2016-07-22 DIAGNOSIS — N1330 Unspecified hydronephrosis: Secondary | ICD-10-CM | POA: Diagnosis present

## 2016-07-22 DIAGNOSIS — F064 Anxiety disorder due to known physiological condition: Secondary | ICD-10-CM | POA: Diagnosis not present

## 2016-07-22 DIAGNOSIS — K219 Gastro-esophageal reflux disease without esophagitis: Secondary | ICD-10-CM | POA: Diagnosis not present

## 2016-07-22 DIAGNOSIS — Z794 Long term (current) use of insulin: Secondary | ICD-10-CM | POA: Diagnosis not present

## 2016-07-22 DIAGNOSIS — Z91041 Radiographic dye allergy status: Secondary | ICD-10-CM | POA: Diagnosis not present

## 2016-07-22 DIAGNOSIS — N138 Other obstructive and reflux uropathy: Secondary | ICD-10-CM | POA: Diagnosis not present

## 2016-07-22 DIAGNOSIS — F17200 Nicotine dependence, unspecified, uncomplicated: Secondary | ICD-10-CM | POA: Diagnosis not present

## 2016-07-22 HISTORY — DX: Major depressive disorder, single episode, unspecified: F32.9

## 2016-07-22 HISTORY — DX: Anxiety disorder, unspecified: F41.9

## 2016-07-22 HISTORY — DX: Other specified postprocedural states: R11.2

## 2016-07-22 HISTORY — DX: Other specified postprocedural states: Z98.890

## 2016-07-22 HISTORY — DX: Depression, unspecified: F32.A

## 2016-07-22 LAB — CBC
HEMATOCRIT: 40.3 % (ref 36.0–46.0)
Hemoglobin: 12.4 g/dL (ref 12.0–15.0)
MCH: 20.6 pg — ABNORMAL LOW (ref 26.0–34.0)
MCHC: 30.8 g/dL (ref 30.0–36.0)
MCV: 66.8 fL — ABNORMAL LOW (ref 78.0–100.0)
Platelets: 206 10*3/uL (ref 150–400)
RBC: 6.03 MIL/uL — ABNORMAL HIGH (ref 3.87–5.11)
RDW: 22.4 % — ABNORMAL HIGH (ref 11.5–15.5)
WBC: 6.8 10*3/uL (ref 4.0–10.5)

## 2016-07-22 LAB — BASIC METABOLIC PANEL
ANION GAP: 8 (ref 5–15)
BUN: 14 mg/dL (ref 6–20)
CO2: 26 mmol/L (ref 22–32)
Calcium: 9.2 mg/dL (ref 8.9–10.3)
Chloride: 103 mmol/L (ref 101–111)
Creatinine, Ser: 1.06 mg/dL — ABNORMAL HIGH (ref 0.44–1.00)
GFR calc Af Amer: >60 mL/min (ref >60)
Glucose, Bld: 156 mg/dL — ABNORMAL HIGH (ref 65–99)
POTASSIUM: 4.4 mmol/L (ref 3.5–5.1)
Sodium: 137 mmol/L (ref 135–145)

## 2016-07-22 LAB — GLUCOSE, CAPILLARY: Glucose-Capillary: 172 mg/dL — ABNORMAL HIGH (ref 65–99)

## 2016-07-22 NOTE — Progress Notes (Signed)
lov Dr. Posey Pronto 05/26/16 chart [id number] 05/26/16 chart ekg 09/02/15 epic

## 2016-07-22 NOTE — H&P (Signed)
Office Visit Report     06/26/2016   --------------------------------------------------------------------------------   Amy Lowe  MRN: [id number]  PRIMARY CARE:    DOB: 1975/05/23, 41 year old Female  REFERRING:    SSN: [id number]  PROVIDER:  Irine Seal, M.D.    TREATING:  Raynelle Bring, M.D.    LOCATION:  [facility] [phone]   --------------------------------------------------------------------------------   CC/HPI: Right hydronephrosis   Amy Lowe follows up today for further evaluation of her right hydronephrosis. She was initially seen by Dr. Irine Seal last summer and underwent right ureteral stent placement. It appeared that her right ureteral obstruction/hydronephrosis was related to her fibroid disease. She underwent a hysterectomy by Dr. Denman George last September. Her ureteral stent was subsequently removed. She was noted to have persistent dilation of the right renal collecting system although this was relatively mild in the initial postoperative setting. She had complained of continued mid back pain extending down the central aspect of her back to her sacral area. This pain has persisted over the past few months and appears to be clearly positional with bending. Interestingly, she feels that she does get significant relief using Azo-Standard. She denies any other urinary complaints. She has not had any hematuria. She does not specifically describe any right flank pain. She follows up today with a repeat ultrasound to ensure resolution of her hydronephrosis. Also, of note, her preoperative renogram demonstrated only 23% relative renal function of the right kidney versus 77% on the left.     ALLERGIES: Contrast Dye - "slight throat itching" Penicillin - Swelling    MEDICATIONS: Cyclobenzaprine Hcl 10 mg tablet  Diphenhydramine Hcl 25 mg capsule  Humalog 100 unit/ml vial  Hydrocortisone 2.5 % cream  Levemir 100 unit/ml vial  Megestrol Acetate 40 mg tablet   Melatonin  Nystatin-Triamcinolone 100,000 unit/gram-0.1 % cream  Tranexamic Acid 650 mg tablet     GU PSH: Cysto Remove Stent FB Sim - 02/04/2016 Cystoscopy Insert Stent, Right - 01/02/2016, Right - 09/05/2015 Cystoscopy Ureteroscopy, Right - 01/02/2016    NON-GU PSH: D&&C    GU PMH: Acute Cystitis - 02/10/2016 Hydronephrosis Unspec      PMH Notes:   1) Right ureteral obstruction: She has a history of right ureteral obstruction as a result of extrinsic obstruction due to severe fibroid disease. She eventually underwent hysterectomy by Dr. Denman George in September 2017. She was initially managed with ureteral stent placement which was able to be removed following her hysterectomy.   NON-GU PMH: Anxiety disorder due to known physiological condition Depression Diabetes Type 2 GERD    FAMILY HISTORY: Cancer - Father   SOCIAL HISTORY: Marital Status: Single Current Smoking Status: Patient smokes.  Drinks 2 caffeinated drinks per day. Has had a blood transfusion. Patient's occupation Scientist, water quality.     Notes: has no children   REVIEW OF SYSTEMS:    GU Review Female:   Patient reports get up at night to urinate and stream starts and stops. Patient denies frequent urination, hard to postpone urination, burning /pain with urination, leakage of urine, trouble starting your stream, have to strain to urinate, and currently pregnant.  Gastrointestinal (Lower):   Patient denies diarrhea and constipation.  Gastrointestinal (Upper):   Patient denies nausea and vomiting.  Constitutional:   Patient reports night sweats and fatigue. Patient denies fever and weight loss.  Skin:   Patient denies skin rash/ lesion and itching.  Eyes:   Patient denies blurred vision and double vision.  Ears/ Nose/ Throat:  Patient reports sinus problems. Patient denies sore throat.  Hematologic/Lymphatic:   Patient denies swollen glands and easy bruising.  Cardiovascular:   Patient denies leg swelling and chest pains.   Respiratory:   Patient denies cough and shortness of breath.  Endocrine:   Patient reports excessive thirst.   Musculoskeletal:   Patient reports back pain.   Neurological:   Patient reports headaches. Patient denies dizziness.  Psychologic:   Patient reports depression and anxiety.    VITAL SIGNS:      06/26/2016 03:22 PM  Weight 190 lb / 86.18 kg  Height 64 in / 162.56 cm  BP 127/80 mmHg  Pulse 82 /min  BMI 32.6 kg/m   MULTI-SYSTEM PHYSICAL EXAMINATION:    Constitutional: Well-nourished. No physical deformities. Normally developed. Good grooming.  Gastrointestinal: No CVA tenderness. Her back pain is not reproducible.     PAST DATA REVIEWED:  Source Of History:  Patient  X-Ray Review: Renal Ultrasound: Reviewed Films. I reviewed her renal ultrasound today. The left kidney demonstrates no hydronephrosis. However she has severe right-sided hydronephrosis with thinning renal cortex which appears progressed from her last imaging in the fall.    PROCEDURES:         Renal Ultrasound - 18563  Right kidney  Length: 11.8 cm Depth: 6.9 cm Cortical Width: 1.1 cm Width: 6.7 cm  Left Kidney Length: 11.5 cm Depth: 5.1 cm Cortical Width: 1.6 cm Width: 4.6 cm   Left Kidney/Ureter:  Appears WNLs  Right Kidney/Ureter:  Hydro. noted. Prox ureter= 2.5cm Mid ureter= 1.2cm  Bladder:  PVR= 5.37               Urinalysis w/Scope Dipstick Dipstick Cont'd Micro  Color: Amber Bilirubin: Neg WBC/hpf: 0 - 5/hpf  Appearance: Cloudy Ketones: Neg RBC/hpf: NS (Not Seen)  Specific Gravity: 1.020 Blood: Neg Bacteria: Mod (26-50/hpf)  pH: 6.0 Protein: Neg Cystals: NS (Not Seen)  Glucose: Neg Urobilinogen: 0.2 Casts: NS (Not Seen)    Nitrites: Neg Trichomonas: Not Present    Leukocyte Esterase: Neg Mucous: Not Present      Epithelial Cells: 6 - 10/hpf      Yeast: NS (Not Seen)      Sperm: Not Present    ASSESSMENT:      ICD-10 Details  1 GU:   Hydronephrosis Unspec - N13.30    PLAN:            Orders Labs Urine Culture          Schedule X-Rays: 2 Weeks - Lasix Renogram  Return Visit/Planned Activity: Other See Visit Notes             Note: Will call with results          Document Letter(s):  Created for Patient: Clinical Summary         Notes:   1. Right hydronephrosis: This is somewhat surprising considering she is status post hysterectomy but does raise the concern that she may have another source of obstruction aside from extrinsic obstruction from her fibroid disease. I have recommended that we proceed with a repeat renal scan to determine the relative renal function of the right kidney at this point and to assess whether she is truly obstructed. Considering the fact that it appears her hydronephrosis has progressed, I am highly concerned that she is obstructed. She understands the further evaluation will depend on the relative renal function of the right kidney. This kidney has lost significant function, it may not be worthwhile to  pursue further evaluation as we would not consider any reconstructive procedure. She has previously undergone an evaluation to rule out the possibility of an intrinsic cause for obstruction. In the setting of poor relative renal function, he only procedure we would consider would be a nephrectomy and I would not necessarily recommend that unless she was obviously symptomatic from the right kidney. Currently, the suspicion that her pain symptoms are related to her kidney are quite low considering that her pain is positional and not in the expected location for renal pain.   If she does have enough preserved relative renal function of the right kidney, I will consider a repeat CT scan of the abdomen and pelvis and consider proceeding to the operating room for cystoscopy, retrograde pyelography, possible ureteroscopy, and right ureteral stent placement. We have discussed this today including the potential risks and complications of this procedure and the  expected recovery process.   2. Back pain: I do not think that her back pain is from a urologic source based on the description of her pain, location of the pain, and the fact that it is positional. I do not have an explanation as to why this seems to be improved with Azo-Standard. I have placed a call to her primary care physician, Dr. Harlene Salts, with [facility] in [facility]. He was not available today but I did leave a message to have them call me next week. I wish to make him aware that I do not think her back pain is related to her hydronephrosis but this is causing her significant distress and discomfort and is her main complaint at this time. I will recommend that she continue to proceed with further evaluation of her back pain concurrent with her evaluation of her hydronephrosis.   Cc: Dr. Velna Ochs    E & M CODE: I spent at least 25 minutes face to face with the patient, more than 50% of that time was spent on counseling and/or coordinating care.     * Signed by Raynelle Bring, M.D. on 06/26/16 at 9:56 PM (EDT)*       APPENDED NOTES:  I spoke with Graylee this evening and reviewed her recent renal scan results. This demonstrates relative renal function on the right side of 17% which is decreased from 23% last spring. She has no appreciable excretion from the right kidney although this may be due to poor function. We have discussed options and she is agreeable to proceed with cystoscopy, right retrograde pyelography, possible right ureteroscopy with ureteral stent placement. I again reiterated that I think her back pain is unrelated to her kidney and urologic situation although, if she does have evidence of obstruction, we certainly will see her for back pain improves with stent placement. This will be scheduled for the near future. We have reviewed potential risks and complications as well as the expected recovery process. She gives informed consent to proceed.     * Signed by Raynelle Bring, M.D. on 07/15/16 at 6:53 PM (EDT)*

## 2016-07-23 ENCOUNTER — Encounter (HOSPITAL_COMMUNITY): Payer: Self-pay | Admitting: *Deleted

## 2016-07-23 ENCOUNTER — Encounter (HOSPITAL_COMMUNITY)
Admission: RE | Disposition: A | Discharge status: Home / Self Care | Payer: Self-pay | Source: Ambulatory Visit | Attending: Urology

## 2016-07-23 ENCOUNTER — Ambulatory Visit (HOSPITAL_COMMUNITY)
Admission: RE | Admit: 2016-07-23 | Discharge: 2016-07-23 | Disposition: A | Discharge status: Home / Self Care | Payer: BLUE CROSS/BLUE SHIELD | Source: Ambulatory Visit | Attending: Urology | Admitting: Urology

## 2016-07-23 ENCOUNTER — Ambulatory Visit (HOSPITAL_COMMUNITY): Payer: BLUE CROSS/BLUE SHIELD | Admitting: Registered Nurse

## 2016-07-23 ENCOUNTER — Ambulatory Visit (HOSPITAL_COMMUNITY): Payer: BLUE CROSS/BLUE SHIELD

## 2016-07-23 DIAGNOSIS — E119 Type 2 diabetes mellitus without complications: Secondary | ICD-10-CM | POA: Insufficient documentation

## 2016-07-23 DIAGNOSIS — N135 Crossing vessel and stricture of ureter without hydronephrosis: Secondary | ICD-10-CM

## 2016-07-23 DIAGNOSIS — Z880 Allergy status to penicillin: Secondary | ICD-10-CM | POA: Insufficient documentation

## 2016-07-23 DIAGNOSIS — Z794 Long term (current) use of insulin: Secondary | ICD-10-CM | POA: Insufficient documentation

## 2016-07-23 DIAGNOSIS — Z91041 Radiographic dye allergy status: Secondary | ICD-10-CM | POA: Insufficient documentation

## 2016-07-23 DIAGNOSIS — K219 Gastro-esophageal reflux disease without esophagitis: Secondary | ICD-10-CM | POA: Insufficient documentation

## 2016-07-23 DIAGNOSIS — N138 Other obstructive and reflux uropathy: Secondary | ICD-10-CM | POA: Insufficient documentation

## 2016-07-23 DIAGNOSIS — Z79899 Other long term (current) drug therapy: Secondary | ICD-10-CM | POA: Insufficient documentation

## 2016-07-23 DIAGNOSIS — F17200 Nicotine dependence, unspecified, uncomplicated: Secondary | ICD-10-CM | POA: Insufficient documentation

## 2016-07-23 DIAGNOSIS — N1330 Unspecified hydronephrosis: Secondary | ICD-10-CM | POA: Insufficient documentation

## 2016-07-23 DIAGNOSIS — I10 Essential (primary) hypertension: Secondary | ICD-10-CM | POA: Insufficient documentation

## 2016-07-23 DIAGNOSIS — F329 Major depressive disorder, single episode, unspecified: Secondary | ICD-10-CM | POA: Insufficient documentation

## 2016-07-23 DIAGNOSIS — M1990 Unspecified osteoarthritis, unspecified site: Secondary | ICD-10-CM | POA: Insufficient documentation

## 2016-07-23 DIAGNOSIS — Z9889 Other specified postprocedural states: Secondary | ICD-10-CM | POA: Insufficient documentation

## 2016-07-23 DIAGNOSIS — F064 Anxiety disorder due to known physiological condition: Secondary | ICD-10-CM | POA: Insufficient documentation

## 2016-07-23 DIAGNOSIS — Z90710 Acquired absence of both cervix and uterus: Secondary | ICD-10-CM | POA: Insufficient documentation

## 2016-07-23 HISTORY — PX: CYSTOSCOPY/RETROGRADE/URETEROSCOPY: SHX5316

## 2016-07-23 LAB — GLUCOSE, CAPILLARY
Glucose-Capillary: 220 mg/dL — ABNORMAL HIGH (ref 65–99)
Glucose-Capillary: 199 mg/dL — ABNORMAL HIGH (ref 65–99)

## 2016-07-23 SURGERY — CYSTOSCOPY/RETROGRADE/URETEROSCOPY
Anesthesia: General | Site: Ureter | Laterality: Right

## 2016-07-23 MED ORDER — DIPHENHYDRAMINE HCL 50 MG/ML IJ SOLN
INTRAMUSCULAR | Status: AC
  Filled 2016-07-23: qty 1

## 2016-07-23 MED ORDER — PHENAZOPYRIDINE HCL 100 MG PO TABS: 100.0000 mg | ORAL_TABLET | Freq: Three times a day (TID) | ORAL | 0 refills | Status: AC | PRN

## 2016-07-23 MED ORDER — FENTANYL CITRATE (PF) 100 MCG/2ML IJ SOLN
INTRAMUSCULAR | Status: DC | PRN
  Administered 2016-07-23 (×2): 50 ug via INTRAVENOUS

## 2016-07-23 MED ORDER — LIDOCAINE 2% (20 MG/ML) 5 ML SYRINGE
INTRAMUSCULAR | Status: DC | PRN
  Administered 2016-07-23: 100 mg via INTRAVENOUS

## 2016-07-23 MED ORDER — DEXAMETHASONE SODIUM PHOSPHATE 10 MG/ML IJ SOLN
INTRAMUSCULAR | Status: DC | PRN
  Administered 2016-07-23: 8 mg via INTRAVENOUS

## 2016-07-23 MED ORDER — FENTANYL CITRATE (PF) 100 MCG/2ML IJ SOLN
50.0000 ug | INTRAMUSCULAR | Status: DC | PRN
  Administered 2016-07-23: 100 ug via INTRAVENOUS

## 2016-07-23 MED ORDER — LACTATED RINGERS IV SOLN
INTRAVENOUS | Status: DC
  Administered 2016-07-23: 09:00:00 via INTRAVENOUS

## 2016-07-23 MED ORDER — DEXAMETHASONE SODIUM PHOSPHATE 10 MG/ML IJ SOLN
INTRAMUSCULAR | Status: AC
  Filled 2016-07-23: qty 1

## 2016-07-23 MED ORDER — DIPHENHYDRAMINE HCL 50 MG/ML IJ SOLN
INTRAMUSCULAR | Status: DC | PRN
  Administered 2016-07-23: 12.5 mg via INTRAVENOUS

## 2016-07-23 MED ORDER — SODIUM CHLORIDE 0.9 % IR SOLN
Status: DC | PRN
  Administered 2016-07-23: 4000 mL

## 2016-07-23 MED ORDER — CIPROFLOXACIN IN D5W 400 MG/200ML IV SOLN
400.0000 mg | INTRAVENOUS | Status: AC
  Administered 2016-07-23: 400 mg via INTRAVENOUS
  Filled 2016-07-23: qty 200

## 2016-07-23 MED ORDER — NEOSTIGMINE METHYLSULFATE 5 MG/5ML IV SOSY
PREFILLED_SYRINGE | INTRAVENOUS | Status: AC
  Filled 2016-07-23: qty 5

## 2016-07-23 MED ORDER — FENTANYL CITRATE (PF) 100 MCG/2ML IJ SOLN
INTRAMUSCULAR | Status: AC
  Administered 2016-07-23: 100 ug via INTRAVENOUS
  Filled 2016-07-23: qty 2

## 2016-07-23 MED ORDER — PROPOFOL 10 MG/ML IV BOLUS
INTRAVENOUS | Status: AC
  Filled 2016-07-23: qty 20

## 2016-07-23 MED ORDER — MIDAZOLAM HCL 5 MG/5ML IJ SOLN
INTRAMUSCULAR | Status: DC | PRN
  Administered 2016-07-23 (×2): 1 mg via INTRAVENOUS

## 2016-07-23 MED ORDER — PROMETHAZINE HCL 25 MG/ML IJ SOLN: 6.2500 mg | INTRAMUSCULAR | Status: DC | PRN

## 2016-07-23 MED ORDER — PROPOFOL 10 MG/ML IV BOLUS
INTRAVENOUS | Status: DC | PRN
  Administered 2016-07-23: 200 mg via INTRAVENOUS

## 2016-07-23 MED ORDER — GLYCOPYRROLATE 0.2 MG/ML IV SOSY
PREFILLED_SYRINGE | INTRAVENOUS | Status: AC
  Filled 2016-07-23: qty 5

## 2016-07-23 MED ORDER — PHENAZOPYRIDINE HCL 100 MG PO TABS
100.0000 mg | ORAL_TABLET | Freq: Once | ORAL | Status: AC
  Administered 2016-07-23: 100 mg via ORAL
  Filled 2016-07-23: qty 1

## 2016-07-23 MED ORDER — ALBUMIN HUMAN 5 % IV SOLN
INTRAVENOUS | Status: AC
  Filled 2016-07-23: qty 250

## 2016-07-23 MED ORDER — LIDOCAINE 2% (20 MG/ML) 5 ML SYRINGE
INTRAMUSCULAR | Status: AC
  Filled 2016-07-23: qty 5

## 2016-07-23 MED ORDER — ONDANSETRON HCL 4 MG/2ML IJ SOLN
INTRAMUSCULAR | Status: DC | PRN
Start: 2016-07-23 — End: 2016-07-23
  Administered 2016-07-23: 4 mg via INTRAVENOUS

## 2016-07-23 MED ORDER — ONDANSETRON HCL 4 MG/2ML IJ SOLN
INTRAMUSCULAR | Status: AC
  Filled 2016-07-23: qty 2

## 2016-07-23 MED ORDER — HYDROMORPHONE HCL 2 MG/ML IJ SOLN: 0.2500 mg | INTRAMUSCULAR | Status: DC | PRN

## 2016-07-23 MED ORDER — 0.9 % SODIUM CHLORIDE (POUR BTL) OPTIME
TOPICAL | Status: DC | PRN
  Administered 2016-07-23: 1000 mL

## 2016-07-23 MED ORDER — IOHEXOL 300 MG/ML  SOLN
INTRAMUSCULAR | Status: DC | PRN
  Administered 2016-07-23: 9 mL

## 2016-07-23 MED ORDER — PHENYLEPHRINE 40 MCG/ML (10ML) SYRINGE FOR IV PUSH (FOR BLOOD PRESSURE SUPPORT)
PREFILLED_SYRINGE | INTRAVENOUS | Status: AC
  Filled 2016-07-23: qty 10

## 2016-07-23 MED ORDER — FENTANYL CITRATE (PF) 100 MCG/2ML IJ SOLN
INTRAMUSCULAR | Status: AC
  Filled 2016-07-23: qty 2

## 2016-07-23 MED ORDER — SCOPOLAMINE 1 MG/3DAYS TD PT72
1.0000 | MEDICATED_PATCH | Freq: Once | TRANSDERMAL | Status: DC
  Administered 2016-07-23: 1.5 mg via TRANSDERMAL

## 2016-07-23 MED ORDER — PHENAZOPYRIDINE HCL 100 MG PO TABS
100.0000 mg | ORAL_TABLET | Freq: Three times a day (TID) | ORAL | Status: DC
  Filled 2016-07-23 (×4): qty 1

## 2016-07-23 MED ORDER — MIDAZOLAM HCL 2 MG/2ML IJ SOLN
INTRAMUSCULAR | Status: AC
  Filled 2016-07-23: qty 2

## 2016-07-23 SURGICAL SUPPLY — 20 items
BAG URO CATCHER STRL LF (MISCELLANEOUS) ×3 IMPLANT
BASKET ZERO TIP NITINOL 2.4FR (BASKET) IMPLANT
BSKT STON RTRVL ZERO TP 2.4FR (BASKET)
CATH INTERMIT  6FR 70CM (CATHETERS) IMPLANT
CLOTH BEACON ORANGE TIMEOUT ST (SAFETY) ×3 IMPLANT
COVER SURGICAL LIGHT HANDLE (MISCELLANEOUS) ×3 IMPLANT
FIBER LASER FLEXIVA 365 (UROLOGICAL SUPPLIES) IMPLANT
FIBER LASER TRAC TIP (UROLOGICAL SUPPLIES) IMPLANT
GLOVE BIOGEL M STRL SZ7.5 (GLOVE) ×3 IMPLANT
GOWN STRL REUS W/TWL LRG LVL3 (GOWN DISPOSABLE) ×6 IMPLANT
GUIDEWIRE ANG ZIPWIRE 038X150 (WIRE) IMPLANT
GUIDEWIRE STR DUAL SENSOR (WIRE) ×3 IMPLANT
IV NS 1000ML (IV SOLUTION) ×3
IV NS 1000ML BAXH (IV SOLUTION) ×1 IMPLANT
MANIFOLD NEPTUNE II (INSTRUMENTS) ×3 IMPLANT
PACK CYSTO (CUSTOM PROCEDURE TRAY) ×3 IMPLANT
SHEATH ACCESS URETERAL 38CM (SHEATH) IMPLANT
STENT CONTOUR 6FRX28X.038 (STENTS) ×2 IMPLANT
TUBING CONNECTING 10 (TUBING) ×2 IMPLANT
TUBING CONNECTING 10' (TUBING) ×1

## 2016-07-23 NOTE — Transfer of Care (Signed)
Immediate Anesthesia Transfer of Care Note  Patient: Amy Lowe  Procedure(s) Performed: Procedure(s): CYSTOSCOPY/RETROGRADE/URETEROSCOPY/ WITH RIGHT STENT PLACEMENT (Right)  Patient Location: PACU  Anesthesia Type:General  Level of Consciousness: awake and sedated  Airway & Oxygen Therapy: Patient Spontanous Breathing and Patient connected to face mask oxygen  Post-op Assessment: Report given to RN and Post -op Vital signs reviewed and stable  Post vital signs: Reviewed and stable  Last Vitals:  Vitals:   07/23/16 0832  BP: (!) 150/82  Pulse: 89  Resp: 16  Temp: 36.9 C    Last Pain:  Vitals:   07/23/16 1000  TempSrc:   PainSc: 3       Patients Stated Pain Goal: 3 (48/35/07 5732)  Complications: No apparent anesthesia complications

## 2016-07-23 NOTE — Anesthesia Preprocedure Evaluation (Addendum)
Anesthesia Evaluation  Patient identified by MRN, date of birth, ID band Patient awake    Reviewed: Allergy & Precautions, NPO status , Patient's Chart, lab work & pertinent test results  History of Anesthesia Complications (+) PONV and history of anesthetic complications  Airway Mallampati: I  TM Distance: >3 FB Neck ROM: Full    Dental  (+) Chipped, Dental Advisory Given,    Pulmonary Current Smoker,    breath sounds clear to auscultation       Cardiovascular hypertension,  Rhythm:Regular Rate:Normal     Neuro/Psych  Headaches, PSYCHIATRIC DISORDERS Anxiety Depression negative psych ROS   GI/Hepatic negative GI ROS, Neg liver ROS,   Endo/Other  diabetes, Type 2, Insulin Dependent  Renal/GU   negative genitourinary   Musculoskeletal  (+) Arthritis , Osteoarthritis,    Abdominal (+)  Abdomen: tender.    Peds negative pediatric ROS (+)  Hematology negative hematology ROS (+)   Anesthesia Other Findings   Reproductive/Obstetrics negative OB ROS                            Lab Results  Component Value Date   WBC 6.8 07/22/2016   HGB 12.4 07/22/2016   HCT 40.3 07/22/2016   MCV 66.8 (L) 07/22/2016   PLT 206 07/22/2016   Lab Results  Component Value Date   CREATININE 1.06 (H) 07/22/2016   BUN 14 07/22/2016   NA 137 07/22/2016   K 4.4 07/22/2016   CL 103 07/22/2016   CO2 26 07/22/2016   Lab Results  Component Value Date   INR 1.38 09/02/2015   08/2015 EKG: normal sinus rhythm.  Anesthesia Physical  Anesthesia Plan  ASA: II  Anesthesia Plan: General   Post-op Pain Management:    Induction: Intravenous  Airway Management Planned: Oral ETT and LMA  Additional Equipment:   Intra-op Plan:   Post-operative Plan: Extubation in OR  Informed Consent: I have reviewed the patients History and Physical, chart, labs and discussed the procedure including the risks, benefits  and alternatives for the proposed anesthesia with the patient or authorized representative who has indicated his/her understanding and acceptance.   Dental advisory given  Plan Discussed with: CRNA and Anesthesiologist  Anesthesia Plan Comments:        Anesthesia Quick Evaluation

## 2016-07-23 NOTE — Op Note (Signed)
Preoperative diagnosis:  1. Right hydronephrosis with possible right ureteral obstruction  Postoperative diagnosis:  1. Right hydronephrosis with right ureteral obstruction   Procedure:  1. Cystoscopy 2. Right ureteral stent placement (6 x 28)  3. Right retrograde pyelography with interpretation 4. Right ureteroscopy  Surgeon: Pryor Curia. M.D.  Anesthesia: General  Complications: None  Intraoperative findings: Right retrograde pyelography was performed with Omnipaque contrast via a 6 French ureteral catheter.  This confirmed a distinct transition point at the ureterovesical junction that appeared to be a point of obstruction.  The ureter was severely dilated and tortuous above this level without evidence of filling defects.  Renal collecting system dilation and calyceal dilation was also noted again without filling defects.  EBL: Minimal  Specimens: None  Indication: Amy Lowe is a 41 y.o. patient with right hydronephrosis. After reviewing the management options for treatment, she elected to proceed with the above surgical procedure(s). We have discussed the potential benefits and risks of the procedure, side effects of the proposed treatment, the likelihood of the patient achieving the goals of the procedure, and any potential problems that might occur during the procedure or recuperation. Informed consent has been obtained.  Description of procedure:  The patient was taken to the operating room and general anesthesia was induced.  The patient was placed in the dorsal lithotomy position, prepped and draped in the usual sterile fashion, and preoperative antibiotics were administered. A preoperative time-out was performed.   Cystourethroscopy was performed.  The patient's urethra was examined and was normal. The bladder was then systematically examined in its entirety. There was no evidence for any bladder tumors, stones, or other mucosal pathology.    Attention then  turned to the right ureteral orifice and a ureteral catheter was used to intubate the ureteral orifice.  Omnipaque contrast was injected through the ureteral catheter and a retrograde pyelogram was performed with findings as dictated above.  Initial attempts to place a 0.38 sensor guidewire were unsuccessful through the transition point in the distal ureter representing the point of obstruction.  I therefore performed semirigid ureteroscopy with a 6 French ureteroscope and was able to navigate this area and place the guidewire past the point of obstruction.  There were no tumors or other intrinsic abnormalities noted.  However, the ureter appeared to be significantly kinked in the vicinity of the distal right ureter representing either a congenital abnormality or other extrinsic cause.  A 0.38 sensor guidewire was then advanced up the right ureter into the renal pelvis under fluoroscopic guidance. This process was difficult due to the dilation and very tortuous ureter. I had to utilize a 6 [place] ureteral catheter and carefully navigate the wire up into the renal collecting system.  Appropriate positioning was confirmed with contrast injection and fluoroscopy. The wire was then backloaded through the cystoscope and a ureteral stent was advance over the wire using Seldinger technique. Due to the extreme tortuosity of the ureter, I did use a 6 x 28 double-J ureteral stent.  This was the longest stent available and the proximal curl still only reached the most proximal ureter.  However, it was felt that this would relieve the obstruction and allow decompression of the right renal collecting system.  The wire was then removed.  The bladder was then emptied and the procedure ended.  The patient appeared to tolerate the procedure well and without complications.  The patient was able to be awakened and transferred to the recovery unit in satisfactory condition.  Pryor Curia MD

## 2016-07-23 NOTE — Discharge Instructions (Signed)
General Anesthesia, Adult, Care After These instructions provide you with information about caring for yourself after your procedure. Your health care provider may also give you more specific instructions. Your treatment has been planned according to current medical practices, but problems sometimes occur. Call your health care provider if you have any problems or questions after your procedure. What can I expect after the procedure? After the procedure, it is common to have: Vomiting. A sore throat. Mental slowness. It is common to feel: Nauseous. Cold or shivery. Sleepy. Tired. Sore or achy, even in parts of your body where you did not have surgery. Follow these instructions at home: For at least 24 hours after the procedure:  Do not: Participate in activities where you could fall or become injured. Drive. Use heavy machinery. Drink alcohol. Take sleeping pills or medicines that cause drowsiness. Make important decisions or sign legal documents. Take care of children on your own. Rest. Eating and drinking  If you vomit, drink water, juice, or soup when you can drink without vomiting. Drink enough fluid to keep your urine clear or pale yellow. Make sure you have little or no nausea before eating solid foods. Follow the diet recommended by your health care provider. General instructions  Have a responsible adult stay with you until you are awake and alert. Return to your normal activities as told by your health care provider. Ask your health care provider what activities are safe for you. Take over-the-counter and prescription medicines only as told by your health care provider. If you smoke, do not smoke without supervision. Keep all follow-up visits as told by your health care provider. This is important. Contact a health care provider if: You continue to have nausea or vomiting at home, and medicines are not helpful. You cannot drink fluids or start eating again. You cannot  urinate after 8-12 hours. You develop a skin rash. You have fever. You have increasing redness at the site of your procedure. Get help right away if: You have difficulty breathing. You have chest pain. You have unexpected bleeding. You feel that you are having a life-threatening or urgent problem. This information is not intended to replace advice given to you by your health care provider. Make sure you discuss any questions you have with your health care provider. Document Released: 06/29/2000 Document Revised: 08/26/2015 Document Reviewed: 03/07/2015 Elsevier Interactive Patient Education  2017 [place] may see some blood in the urine and may have some burning with urination for 48-72 hours. You also may notice that you have to urinate more frequently or urgently after your procedure which is normal.  2. You should call should you develop an inability urinate, fever > 101, persistent nausea and vomiting that prevents you from eating or drinking to stay hydrated.  3. If you have a stent, you will likely urinate more frequently and urgently until the stent is removed and you may experience some discomfort/pain in the lower abdomen and flank especially when urinating.. You may also intermittently have blood in the urine until the stent is removed.

## 2016-07-23 NOTE — Anesthesia Procedure Notes (Addendum)
Performed by: Maxwell Caul Pre-anesthesia Checklist: Patient identified, Emergency Drugs available, Suction available and Patient being monitored Patient Re-evaluated:Patient Re-evaluated prior to inductionOxygen Delivery Method: Circle system utilized Preoxygenation: Pre-oxygenation with 100% oxygen Intubation Type: IV induction Ventilation: Mask ventilation without difficulty LMA: LMA inserted LMA Size: 4.0 Number of attempts: 1 Placement Confirmation: positive ETCO2 and breath sounds checked- equal and bilateral Tube secured with: Tape Dental Injury: Teeth and Oropharynx as per pre-operative assessment

## 2016-07-23 NOTE — Interval H&P Note (Signed)
History and Physical Interval Note:  07/23/2016 9:59 AM  Amy Lowe  has presented today for surgery, with the diagnosis of RIGHT HYDRONEPHROSIS  The various methods of treatment have been discussed with the patient and family. After consideration of risks, benefits and other options for treatment, the patient has consented to  Procedure(s): CYSTOSCOPY/RETROGRADE/URETEROSCOPY/ STENT (Right) as a surgical intervention .  The patient's history has been reviewed, patient examined, no change in status, stable for surgery.  I have reviewed the patient's chart and labs.  Questions were answered to the patient's satisfaction.     Farhiya Rosten,LES

## 2016-07-23 NOTE — Anesthesia Postprocedure Evaluation (Addendum)
Anesthesia Post Note  Patient: Amy Lowe  Procedure(s) Performed: Procedure(s) (LRB): CYSTOSCOPY/RETROGRADE/URETEROSCOPY/ WITH RIGHT STENT PLACEMENT (Right)  Patient location during evaluation: PACU Anesthesia Type: General Level of consciousness: sedated Pain management: pain level controlled Vital Signs Assessment: post-procedure vital signs reviewed and stable Respiratory status: spontaneous breathing and respiratory function stable Cardiovascular status: stable Anesthetic complications: no       Last Vitals:  Vitals:   07/23/16 1130 07/23/16 1145  BP: (!) 169/80 (!) 168/84  Pulse: (!) 57 (!) 56  Resp: 13 13  Temp:      Last Pain:  Vitals:   07/23/16 1145  TempSrc:   PainSc: Asleep                 Jatavian Calica DANIEL

## 2016-07-24 ENCOUNTER — Encounter (HOSPITAL_COMMUNITY): Payer: Self-pay | Admitting: Urology

## 2016-07-30 ENCOUNTER — Ambulatory Visit (INDEPENDENT_AMBULATORY_CARE_PROVIDER_SITE_OTHER): Payer: BLUE CROSS/BLUE SHIELD | Admitting: Podiatry

## 2016-07-30 ENCOUNTER — Encounter: Payer: Self-pay | Admitting: Podiatry

## 2016-07-30 DIAGNOSIS — M2041 Other hammer toe(s) (acquired), right foot: Secondary | ICD-10-CM | POA: Diagnosis not present

## 2016-07-30 DIAGNOSIS — M2042 Other hammer toe(s) (acquired), left foot: Secondary | ICD-10-CM | POA: Diagnosis not present

## 2016-07-30 DIAGNOSIS — M79671 Pain in right foot: Secondary | ICD-10-CM | POA: Diagnosis not present

## 2016-07-30 DIAGNOSIS — M21969 Unspecified acquired deformity of unspecified lower leg: Secondary | ICD-10-CM

## 2016-07-30 DIAGNOSIS — M79672 Pain in left foot: Secondary | ICD-10-CM

## 2016-07-30 NOTE — Patient Instructions (Addendum)
Seen for bilateral foot pain with multiple digital corns.  Reviewed clinical findings and available treatment options. All lesions debrided. 4th and 5th digit buddy wrapping reviewed. Metatarsal binder x 1 pr and Coban bandage dispensed with instruction. Return as needed.
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
## 2016-09-05 NOTE — Addendum Note (Signed)
Addendum  created 09/05/16 0825 by Duane Boston, MD   Sign clinical note

## 2016-12-02 ENCOUNTER — Encounter: Payer: Self-pay | Admitting: Podiatry

## 2016-12-02 ENCOUNTER — Ambulatory Visit: Payer: BLUE CROSS/BLUE SHIELD | Admitting: Podiatry

## 2016-12-02 DIAGNOSIS — Q828 Other specified congenital malformations of skin: Secondary | ICD-10-CM

## 2016-12-02 DIAGNOSIS — B351 Tinea unguium: Secondary | ICD-10-CM

## 2016-12-02 DIAGNOSIS — M79671 Pain in right foot: Secondary | ICD-10-CM

## 2016-12-02 DIAGNOSIS — M79672 Pain in left foot: Secondary | ICD-10-CM

## 2016-12-02 NOTE — Patient Instructions (Signed)
All painful callused lesions debrided. Pain was relieved. New metatarsal binder x 1 pr, medium dispensed as per request. Return as needed.
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
## 2017-06-06 IMAGING — NM NM RENAL IMAGING FLOW W/ PHARM
2 series · 12 of 12 positions shown · non-contrast
Comparison: CT 09/02/2015

CLINICAL DATA: RIGHT hydronephrosis

EXAM:
NUCLEAR MEDICINE RENAL SCAN WITH DIURETIC ADMINISTRATION
TECHNIQUE: Radionuclide angiographic and sequential renal images were obtained
after intravenous injection of radiopharmaceutical. Imaging was
continued during slow intravenous injection of Lasix approximately
15 minutes after the start of the examination.
RADIOPHARMACEUTICALS:  15.6 mCi Zechnetium-WWm MAG3 IV

[Series 1: renal scan · 4.14mm/px · 6 of 40 frames shown (1 of 2)]
[frame 4/40  full-range]
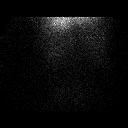
[frame 10/40  full-range]
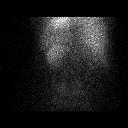
[frame 17/40  full-range]
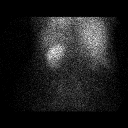
[frame 24/40  full-range]
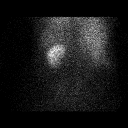
[frame 30/40  full-range]
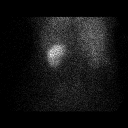
[frame 37/40  full-range]
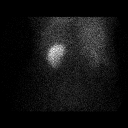

[Series 1: renal scan · 4.14mm/px · 6 of 77 frames shown (2 of 2)]
[frame 7/77]
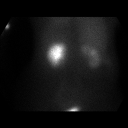
[frame 19/77]
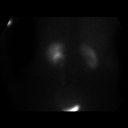
[frame 32/77]
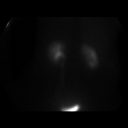
[frame 45/77]
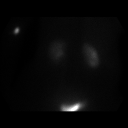
[frame 58/77]
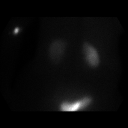
[frame 71/77]
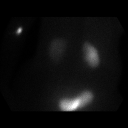

[12 of 12 positions shown; findings below may reference images not displayed]

FINDINGS: Flow: Delayed arterial flow to the RIGHT kidney compared to the
LEFT..

Left renogram: Uniform uptake of counts in the renal cortex. Counts
are promptly excreted into the collecting system and cleared prior
to administration of Lasix. Lasix augment clearance. No postvoid
residual.

Right renogram: Delayed cortical uptake in the RIGHT kidney. RIGHT
renal cortex is thinned and hydronephrosis is present. There is
minimal excretion into the RIGHT renal collecting system. The RIGHT
ureter is not visualized directly. Postvoid renal cortical activity
and renal pelvis residual.

Differential:

Left kidney = 77 %

Right kidney = 23 %

T1/2 post Lasix :

Left kidney = 3.7 minutes (counts clear significantly prior to
administration of Lasix)

Right kidney = no T [DATE] post lasix reached
IMPRESSION: 1. Severe obstructive hydronephrosis of the RIGHT kidney.
2. Asymmetric renal differential as above.

## 2018-03-06 ENCOUNTER — Emergency Department (HOSPITAL_COMMUNITY)
Admission: EM | Admit: 2018-03-06 | Discharge: 2018-03-06 | Disposition: A | Discharge status: Home / Self Care | Payer: BLUE CROSS/BLUE SHIELD | Attending: Emergency Medicine | Admitting: Emergency Medicine

## 2018-03-06 ENCOUNTER — Emergency Department (HOSPITAL_COMMUNITY): Payer: BLUE CROSS/BLUE SHIELD

## 2018-03-06 ENCOUNTER — Encounter (HOSPITAL_COMMUNITY): Payer: Self-pay

## 2018-03-06 DIAGNOSIS — B9789 Other viral agents as the cause of diseases classified elsewhere: Secondary | ICD-10-CM | POA: Insufficient documentation

## 2018-03-06 DIAGNOSIS — F17210 Nicotine dependence, cigarettes, uncomplicated: Secondary | ICD-10-CM | POA: Diagnosis not present

## 2018-03-06 DIAGNOSIS — Z794 Long term (current) use of insulin: Secondary | ICD-10-CM | POA: Insufficient documentation

## 2018-03-06 DIAGNOSIS — E119 Type 2 diabetes mellitus without complications: Secondary | ICD-10-CM | POA: Diagnosis not present

## 2018-03-06 DIAGNOSIS — Z79899 Other long term (current) drug therapy: Secondary | ICD-10-CM | POA: Insufficient documentation

## 2018-03-06 DIAGNOSIS — J069 Acute upper respiratory infection, unspecified: Secondary | ICD-10-CM | POA: Insufficient documentation

## 2018-03-06 DIAGNOSIS — Z209 Contact with and (suspected) exposure to unspecified communicable disease: Secondary | ICD-10-CM | POA: Diagnosis not present

## 2018-03-06 DIAGNOSIS — R05 Cough: Secondary | ICD-10-CM | POA: Diagnosis present

## 2018-03-06 DIAGNOSIS — I10 Essential (primary) hypertension: Secondary | ICD-10-CM | POA: Insufficient documentation

## 2018-03-06 MED ORDER — BENZONATATE 100 MG PO CAPS: 100.0000 mg | ORAL_CAPSULE | Freq: Three times a day (TID) | ORAL | 0 refills | Status: AC

## 2018-03-06 MED ORDER — AZITHROMYCIN 250 MG PO TABS
500.0000 mg | ORAL_TABLET | Freq: Once | ORAL | Status: AC
  Administered 2018-03-06: 500 mg via ORAL
  Filled 2018-03-06: qty 2

## 2018-03-06 MED ORDER — AZITHROMYCIN 250 MG PO TABS: ORAL_TABLET | ORAL | 0 refills | Status: AC

## 2018-03-06 MED ORDER — IPRATROPIUM-ALBUTEROL 0.5-2.5 (3) MG/3ML IN SOLN
3.0000 mL | Freq: Once | RESPIRATORY_TRACT | Status: AC
  Administered 2018-03-06: 3 mL via RESPIRATORY_TRACT
  Filled 2018-03-06: qty 3

## 2018-03-06 NOTE — Discharge Instructions (Signed)
Take tylenol 2 pills 4 times a day and motrin 4 pills 3 times a day.  Drink plenty of fluids.  Return for worsening shortness of breath, headache, confusion. Follow up with your family doctor.   

## 2018-03-06 NOTE — ED Triage Notes (Signed)
Patient c/o chest pressure/heaviness, productive cough, chest congestion, and runny nose X3 weeks. Patient states "woke up yesterday and felt as if a 77lb man was sitting on her chest"  4 weeks ago patient noticed an abscess flare up in the right side of her mouth/tooth that comes and goes for years.   Patient c/o left/right back pain (Stents in both sides)  Patient thinks she is dehydrated because urine is dark.

## 2018-03-06 NOTE — ED Provider Notes (Addendum)
[facility] DEPT Provider Note   CSN: [id number] Arrival date & time: 03/06/18  1351     History   Chief Complaint Chief Complaint  Patient presents with  . Cough  . chest congestion    HPI Amy Lowe is a 42 y.o. female.  42 yo F with a cc of cough and sob.  This is been going on for the past 3 weeks.  Everyone in her jobs have similar illness.  She is started to get worse and so sought medical attention.  Feels that she has a pressure on her chest when she lays back flat and has worsening coughing when she lays back.  Has been sleeping sitting up.  Denies lower extremity edema.  Denies productive sputum.  She is a smoker every day.  The history is provided by the patient.  Cough  This is a new problem. The current episode started more than 1 week ago. The problem occurs constantly. The problem has been gradually worsening. The cough is non-productive. There has been no fever. Associated symptoms include chest pain and shortness of breath. Pertinent negatives include no chills, no headaches, no rhinorrhea, no myalgias, no wheezing and no eye redness. She has tried nothing for the symptoms. The treatment provided no relief. She is a smoker.    Past Medical History:  Diagnosis Date  . Anemia    iron   . Anxiety   . Complication of anesthesia    slow at awaken from anesthesia  . Depression   . Diabetes mellitus    type 2  . Family history of adverse reaction to anesthesia    mother slow to awaken from anesthesia  . Headache    migraines yrs ago  . PONV (postoperative nausea and vomiting)     Patient Active Problem List   Diagnosis Date Noted  . Pelvic mass in female 01/02/2016  . Retroperitoneal fibrosis   . Hydronephrosis of right kidney   . PID (acute pelvic inflammatory disease)   . Anemia due to blood loss 09/02/2015  . Acute blood loss anemia 09/02/2015  . UTI (lower urinary tract infection) 09/02/2015  . Diabetes mellitus type  2, controlled ([place]) 09/02/2015  . Essential hypertension 09/02/2015  . Abdominal pain 09/02/2015  . Anemia 09/02/2015  . 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 Chronic PID (chronic pelvic inflammatory disease) 09/02/2015  . Fibroid uterus 09/02/2015    Past Surgical History:  Procedure Laterality Date  . abdominal laparascopy  2010  . CYSTOSCOPY W/ URETERAL STENT PLACEMENT Right 09/05/2015   Procedure: CYSTO WITH RIGHT RETROGRADE AND STENT PLACEMENT;  Surgeon: Irine Seal, MD;  Location: [facility];  Service: Urology;  Laterality: Right;  . CYSTOSCOPY WITH STENT PLACEMENT Right 01/02/2016   Procedure: CYSTOSCOPY WITH RIGHT URETERAL STENT CHANGE AND RETROGRAGE;  Surgeon: Raynelle Bring, MD;  Location: [facility];  Service: Urology;  Laterality: Right;  . CYSTOSCOPY/RETROGRADE/URETEROSCOPY Right 07/23/2016   Procedure: CYSTOSCOPY/RETROGRADE/URETEROSCOPY/ WITH RIGHT STENT PLACEMENT;  Surgeon: Raynelle Bring, MD;  Location: [facility];  Service: Urology;  Laterality: Right;  . DILATION AND CURETTAGE OF UTERUS    . DNC    . HERNIA REPAIR  age 64  . ROBOTIC ASSISTED TOTAL HYSTERECTOMY WITH BILATERAL SALPINGO OOPHERECTOMY Bilateral 01/02/2016   Procedure: XI ROBOTIC ASSISTED TOTAL HYSTERECTOMY WITH BILATERAL SALPINGO OOPHORECTOMY, LYSIS OF ADHESIONS, UTERUS GRADED AT 250 GRAMS;  Surgeon: Everitt Amber, MD;  Location: [facility];  Service: Gynecology;  Laterality: Bilateral;     OB History   None  Home Medications    Prior to Admission medications   Medication Sig Start Date End Date Taking? Authorizing Provider  azithromycin (ZITHROMAX) 250 MG tablet Take 1 tab every day until finished. 03/06/18   Deno Etienne, DO  BAYER MICROLET LANCETS lancets Use 3 times daily 09/24/15   [provider]  benzonatate (TESSALON) 100 MG capsule Take 1 capsule (100 mg total) by mouth every 8 (eight) hours. 03/06/18   Deno Etienne, DO  diphenhydrAMINE (BENADRYL) 25 MG tablet Take 25 mg by mouth every 6 (six) hours as needed (for seasonal  allergies).     [provider]  ferrous sulfate 325 (65 FE) MG tablet Take 325 mg by mouth daily as needed (for lightheadness/ice cravings).    [provider]  fluconazole (DIFLUCAN) 150 MG tablet Take 150 mg by mouth See admin instructions. Take 1 tablet (150 mg) by mouth daily as needed for yeast infection, repeat dose in 3 days if symptoms persists.    [provider]  gabapentin (NEURONTIN) 300 MG capsule Take 150 mg by mouth at bedtime.    [provider]  glucose blood (BAYER CONTOUR NEXT TEST) test strip Use 3 times daily 09/24/15   [provider]  hydrocortisone (ANUSOL-HC) 2.5 % rectal cream Place rectally 3 (three) times daily. Patient taking differently: Place 1 application rectally daily as needed for hemorrhoids.  09/07/15   [name], MD  insulin detemir (LEVEMIR) 100 UNIT/ML injection Inject 18 Units into the skin at bedtime.     [provider]  insulin lispro (HUMALOG) 100 UNIT/ML injection Inject 6-22 Units into the skin 3 (three) times daily as needed for high blood sugar. 130-150 6 units, go up 2 units for every 50 mg/dl max 22 units    [provider]  Melatonin 10 MG TABS Take 10 mg by mouth at bedtime as needed (sleep).    [provider]  NON FORMULARY 1 Dose by Other route See admin instructions. Patient takes essential oils by mouth in vegetable capsule & also will apply essential oils externally as needed for symptoms.    [provider]  nystatin-triamcinolone (MYCOLOG II) cream Apply topically 2 (two) times daily. Patient taking differently: Apply 1 application topically 2 (two) times daily as needed (itching).  09/07/15   [name], MD  phenazopyridine (PYRIDIUM) 100 MG tablet Take 1 tablet (100 mg total) by mouth 3 (three) times daily as needed for pain (for burning). 07/23/16   Raynelle Bring, MD  senna (SENOKOT) 8.6 MG TABS tablet Take 1 tablet (8.6 mg total) by mouth at  bedtime. Patient taking differently: Take 1 tablet by mouth at bedtime as needed (for constipation induced by iron supplement.).  01/14/16   Everitt Amber, MD    Family History Family History  Problem Relation Age of Onset  . Lung cancer Father   . Diabetes Mellitus II Maternal Grandmother     Social History Social History   Tobacco Use  . Smoking status: Current Some Day Smoker    Packs/day: 0.25    Years: 20.00    Pack years: 5.00    Types: Cigarettes, E-cigarettes  . Smokeless tobacco: Never Used  Substance Use Topics  . Alcohol use: Yes    Comment: occ  . Drug use: No     Allergies   Penicillins and Iodinated diagnostic agents   Review of Systems Review of Systems  Constitutional: Negative for chills and fever.  HENT: Negative for congestion and rhinorrhea.  Eyes: Negative for redness and visual disturbance.  Respiratory: Positive for cough and shortness of breath. Negative for wheezing.   Cardiovascular: Positive for chest pain. Negative for palpitations.  Gastrointestinal: Negative for nausea and vomiting.  Genitourinary: Negative for dysuria and urgency.  Musculoskeletal: Negative for arthralgias and myalgias.  Skin: Negative for pallor and wound.  Neurological: Negative for dizziness and headaches.     Physical Exam Updated Vital Signs BP (!) 189/102   Pulse 82   Temp 99.2 F (37.3 C) (Oral)   Resp 18   LMP 12/10/2015   SpO2 100%   Physical Exam  Constitutional: She is oriented to person, place, and time. She appears well-developed and well-nourished. No distress.  HENT:  Head: Normocephalic and atraumatic.  Swollen turbinates, posterior nasal drip, no noted sinus ttp, tm normal bilaterally.    Eyes: Pupils are equal, round, and reactive to light. EOM are normal.  Neck: Normal range of motion. Neck supple.  Cardiovascular: Normal rate and regular rhythm. Exam reveals no gallop and no friction rub.  No murmur heard. Pulmonary/Chest: Effort  normal. She has no wheezes. She has no rales.  Bronchospastic cough  Abdominal: Soft. She exhibits no distension. There is no tenderness.  Musculoskeletal: She exhibits no edema or tenderness.  Neurological: She is alert and oriented to person, place, and time.  Skin: Skin is warm and dry. She is not diaphoretic.  Psychiatric: She has a normal mood and affect. Her behavior is normal.  Nursing note and vitals reviewed.    ED Treatments / Results  Labs (all labs ordered are listed, but only abnormal results are displayed) Labs Reviewed - No data to display  EKG None  Radiology Dg Chest 2 View  Result Date: 03/06/2018 CLINICAL DATA:  Cough and chest pressure EXAM: CHEST - 2 VIEW COMPARISON:  None FINDINGS: The heart size and mediastinal contours are within normal limits. Both lungs are clear. The visualized skeletal structures are unremarkable. IMPRESSION: No active cardiopulmonary disease. Electronically Signed   By: Kerby Moors M.D.   On: 03/06/2018 15:42    Procedures Procedures (including critical care time) Discussed smoking cessation with patient and was they were offerred resources to help stop.  Total time was 5 min CPT code 99406.   Medications Ordered in ED Medications  ipratropium-albuterol (DUONEB) 0.5-2.5 (3) MG/3ML nebulizer solution 3 mL (3 mLs Nebulization Given 03/06/18 1856)  azithromycin (ZITHROMAX) tablet 500 mg (500 mg Oral Given 03/06/18 1952)     Initial Impression / Assessment and Plan / ED Course  I have reviewed the triage vital signs and the nursing notes.  Pertinent labs & imaging results that were available during my care of the patient were reviewed by me and considered in my medical decision making (see chart for details).     42 yo F with a cc of a cough. Going on for the past 3 weeks.  Clear lungs post coughing, trial breathing treatment without improvement.  Will treat with z pack for possible pertussis.   7:52 PM:  I have discussed the  diagnosis/risks/treatment options with the patient and believe the pt to be eligible for discharge home to follow-up with PCP. We also discussed returning to the ED immediately if new or worsening sx occur. We discussed the sx which are most concerning (e.g., sudden worsening pain, fever, inability to tolerate by mouth) that necessitate immediate return. Medications administered to the patient during their visit and any new prescriptions provided to the patient are listed below.  Medications given during this visit Medications  ipratropium-albuterol (DUONEB) 0.5-2.5 (3) MG/3ML nebulizer solution 3 mL (3 mLs Nebulization Given 03/06/18 1856)  azithromycin (ZITHROMAX) tablet 500 mg (500 mg Oral Given 03/06/18 1952)      The patient appears reasonably screen and/or stabilized for discharge and I doubt any other medical condition or other [facility] requiring further screening, evaluation, or treatment in the ED at this time prior to discharge.    Final Clinical Impressions(s) / ED Diagnoses   Final diagnoses:  Viral URI with cough    ED Discharge Orders         Ordered    azithromycin (ZITHROMAX) 250 MG tablet     03/06/18 1948    benzonatate (TESSALON) 100 MG capsule  Every 8 hours     03/06/18 1949           Deno Etienne, DO 03/06/18 [place], [employer], DO 03/06/18 2026

## 2021-02-10 ENCOUNTER — Ambulatory Visit (HOSPITAL_COMMUNITY): Admit: 2021-02-10 | Payer: BLUE CROSS/BLUE SHIELD

## 2021-08-08 ENCOUNTER — Emergency Department (HOSPITAL_COMMUNITY)
Admission: EM | Admit: 2021-08-08 | Discharge: 2021-08-08 | Disposition: A | Discharge status: Home / Self Care | Payer: BC Managed Care – PPO | Attending: Emergency Medicine | Admitting: Emergency Medicine

## 2021-08-08 ENCOUNTER — Encounter (HOSPITAL_COMMUNITY): Payer: Self-pay

## 2021-08-08 ENCOUNTER — Other Ambulatory Visit: Payer: Self-pay

## 2021-08-08 DIAGNOSIS — W268XXA Contact with other sharp object(s), not elsewhere classified, initial encounter: Secondary | ICD-10-CM | POA: Insufficient documentation

## 2021-08-08 DIAGNOSIS — S61412A Laceration without foreign body of left hand, initial encounter: Secondary | ICD-10-CM | POA: Diagnosis not present

## 2021-08-08 DIAGNOSIS — Z23 Encounter for immunization: Secondary | ICD-10-CM | POA: Diagnosis not present

## 2021-08-08 DIAGNOSIS — Y990 Civilian activity done for income or pay: Secondary | ICD-10-CM | POA: Insufficient documentation

## 2021-08-08 DIAGNOSIS — S6992XA Unspecified injury of left wrist, hand and finger(s), initial encounter: Secondary | ICD-10-CM | POA: Diagnosis present

## 2021-08-08 DIAGNOSIS — Z794 Long term (current) use of insulin: Secondary | ICD-10-CM | POA: Diagnosis not present

## 2021-08-08 MED ORDER — TETANUS-DIPHTH-ACELL PERTUSSIS 5-2.5-18.5 LF-MCG/0.5 IM SUSY
0.5000 mL | PREFILLED_SYRINGE | Freq: Once | INTRAMUSCULAR | Status: AC
  Administered 2021-08-08: 0.5 mL via INTRAMUSCULAR
  Filled 2021-08-08: qty 0.5

## 2021-08-08 MED ORDER — LIDOCAINE HCL (PF) 1 % IJ SOLN
5.0000 mL | Freq: Once | INTRAMUSCULAR | Status: AC
  Administered 2021-08-08: 5 mL
  Filled 2021-08-08: qty 30

## 2021-08-08 NOTE — ED Provider Notes (Signed)
?[facility] ?Provider Note ? ? ?CSN: [id number] ?Arrival date & time: 08/08/21  0304 ? ?  ? ?History ? ?Chief Complaint  ?Patient presents with  ? Laceration  ? ? ?LEZLY RUMPF is a 46 y.o. female. ? ?46 year old female presents with complaint of laceration to her left, nondominant hand.  Patient was at work when she sliced her hand accidentally with a pan.  Laceration located in the webspace between the thumb and index finger.  Bleeding is controlled, not anticoagulated, denies weakness or numbness in the hand.  Last tetanus is unknown.  No other injuries or concerns. ? ? ?  ? ?Home Medications ?Prior to Admission medications   ?Medication Sig Start Date End Date Taking? Authorizing Provider  ?azithromycin (ZITHROMAX) 250 MG tablet Take 1 tab every day until finished. 03/06/18   Deno Etienne, DO  ?BAYER MICROLET LANCETS lancets Use 3 times daily 09/24/15   [provider]  ?benzonatate (TESSALON) 100 MG capsule Take 1 capsule (100 mg total) by mouth every 8 (eight) hours. 03/06/18   Deno Etienne, DO  ?diphenhydrAMINE (BENADRYL) 25 MG tablet Take 25 mg by mouth every 6 (six) hours as needed (for seasonal allergies).     [provider]  ?ferrous sulfate 325 (65 FE) MG tablet Take 325 mg by mouth daily as needed (for lightheadness/ice cravings).    [provider]  ?fluconazole (DIFLUCAN) 150 MG tablet Take 150 mg by mouth See admin instructions. Take 1 tablet (150 mg) by mouth daily as needed for yeast infection, repeat dose in 3 days if symptoms persists.    [provider]  ?gabapentin (NEURONTIN) 300 MG capsule Take 150 mg by mouth at bedtime.    [provider]  ?glucose blood (BAYER CONTOUR NEXT TEST) test strip Use 3 times daily 09/24/15   [provider]  ?hydrocortisone (ANUSOL-HC) 2.5 % rectal cream Place rectally 3 (three) times daily. ?Patient taking differently: Place 1 application rectally daily as needed for hemorrhoids.   09/07/15   [name], MD  ?insulin detemir (LEVEMIR) 100 UNIT/ML injection Inject 18 Units into the skin at bedtime.     [provider]  ?insulin lispro (HUMALOG) 100 UNIT/ML injection Inject 6-22 Units into the skin 3 (three) times daily as needed for high blood sugar. 130-150 6 units, go up 2 units for every 50 mg/dl max 22 units    [provider]  ?Melatonin 10 MG TABS Take 10 mg by mouth at bedtime as needed (sleep).    [provider]  ?NON FORMULARY 1 Dose by Other route See admin instructions. Patient takes essential oils by mouth in vegetable capsule & also will apply essential oils externally as needed for symptoms.    [provider]  ?nystatin-triamcinolone (MYCOLOG II) cream Apply topically 2 (two) times daily. ?Patient taking differently: Apply 1 application topically 2 (two) times daily as needed (itching).  09/07/15   [name], MD  ?phenazopyridine (PYRIDIUM) 100 MG tablet Take 1 tablet (100 mg total) by mouth 3 (three) times daily as needed for pain (for burning). 07/23/16   Raynelle Bring, MD  ?senna (SENOKOT) 8.6 MG TABS tablet Take 1 tablet (8.6 mg total) by mouth at bedtime. ?Patient taking differently: Take 1 tablet by mouth at bedtime as needed (for constipation induced by iron supplement.).  01/14/16   Everitt Amber, MD  ?   ? ?Allergies    ?Penicillins and Iodinated contrast media   ? ?Review of Systems   ?  Review of Systems ?Negative except as per HPI ?Physical Exam ?Updated Vital Signs ?BP (!) 181/103 (BP Location: Left Arm)   Pulse 89   Temp 98.1 ?F (36.7 ?C) (Oral)   Resp 16   Ht '5\' 6"'$  (1.676 m)   Wt 115.7 kg   LMP 12/10/2015   SpO2 99%   BMI 41.16 kg/m?  ?Physical Exam ?Vitals and nursing note reviewed.  ?Constitutional:   ?   General: She is not in acute distress. ?   Appearance: She is well-developed. She is not diaphoretic.  ?HENT:  ?   Head: Normocephalic and atraumatic.  ?Cardiovascular:  ?   Pulses: Normal pulses.  ?Pulmonary:   ?   Effort: Pulmonary effort is normal.  ?Musculoskeletal:     ?   General: Tenderness and signs of injury present. No swelling or deformity.  ?   Comments: Approximately 3 cm laceration to the webspace between the thumb and index finger, no active bleeding, brisk capillary refill present, sensation intact distally, strength intact.  ?Skin: ?   General: Skin is warm and dry.  ?   Findings: No erythema or rash.  ?Neurological:  ?   Mental Status: She is alert and oriented to person, place, and time.  ?   Sensory: No sensory deficit.  ?   Motor: No weakness.  ?Psychiatric:     ?   Behavior: Behavior normal.  ? ? ?ED Results / Procedures / Treatments   ?Labs ?(all labs ordered are listed, but only abnormal results are displayed) ?Labs Reviewed - No data to display ? ?EKG ?None ? ?Radiology ?No results found. ? ?Procedures ?Marland Kitchen.Laceration Repair ? ?Date/Time: 08/08/2021 3:28 AM ?Performed by: Tacy Learn, PA-C ?Authorized by: Tacy Learn, PA-C  ? ?Consent:  ?  Consent obtained:  Verbal ?  Consent given by:  Patient ?  Risks discussed:  Infection, need for additional repair, pain, poor cosmetic result and poor wound healing ?  Alternatives discussed:  No treatment and delayed treatment ?Universal protocol:  ?  Procedure explained and questions answered to patient or proxy's satisfaction: yes   ?  Relevant documents present and verified: yes   ?  Test results available: yes   ?  Imaging studies available: yes   ?  Required blood products, implants, devices, and special equipment available: yes   ?  Site/side marked: yes   ?  Immediately prior to procedure, a time out was called: yes   ?  Patient identity confirmed:  Verbally with patient ?Anesthesia:  ?  Anesthesia method:  Local infiltration ?  Local anesthetic:  Lidocaine 1% w/o epi ?Laceration details:  ?  Location:  Hand ?  Hand location:  L palm ?  Length (cm):  3 ?  Depth (mm):  3 ?Pre-procedure details:  ?  Preparation:  Patient was prepped and draped in usual  sterile fashion ?Exploration:  ?  Wound extent: no foreign bodies/material noted, no muscle damage noted, no nerve damage noted, no tendon damage noted and no vascular damage noted   ?  Contaminated: no   ?Treatment:  ?  Area cleansed with:  Saline ?  Amount of cleaning:  Standard ?  Irrigation solution:  Sterile saline ?  Debridement:  None ?Skin repair:  ?  Repair method:  Sutures ?  Suture size:  4-0 ?  Suture material:  Nylon ?  Suture technique: simple interrupted and mattress. ?  Number of sutures: 4 SI and 1 mattress. ?Repair type:  ?  Repair type:  Simple ?Post-procedure details:  ?  Dressing:  Bulky dressing ?  Procedure completion:  Tolerated well, no immediate complications  ? ? ?Medications Ordered in ED ?Medications  ?Tdap (BOOSTRIX) injection 0.5 mL (0.5 mLs Intramuscular Given 08/08/21 0339)  ?lidocaine (PF) (XYLOCAINE) 1 % injection 5 mL (5 mLs Infiltration Given 08/08/21 0339)  ? ? ?ED Course/ Medical Decision Making/ A&P ?  ?                        ?Medical Decision Making ?Risk ?Prescription drug management. ? ? ?46 year old female presents with complaint of laceration to her left hand which occurred at work today as above.  She is found to have a L-shaped laceration to the webspace between her thumb and index fingers of her left/nondominant hand.  No active bleeding.  Normal range of motion, sensation intact.  Wound was irrigated, anesthetized, closed with sutures.  Recommend wound check in 2 days with Worker's Comp. provider with suture removal in about 10 days. ? ? ? ? ? ? ? ?Final Clinical Impression(s) / ED Diagnoses ?Final diagnoses:  ?Laceration of left hand, foreign body presence unspecified, initial encounter  ? ? ?Rx / DC Orders ?ED Discharge Orders   ? ? None  ? ?  ? ? ?  ?Tacy Learn, PA-C ?08/08/21 0431 ? ?  ?Quintella Reichert, MD ?08/08/21 [phone] ? ?

## 2021-08-08 NOTE — Discharge Instructions (Signed)
Recheck with your Worker's Comp[employer] provider in 2 days.  Suture removal in 10 days. ?Keep wound clean and dry. ?

## 2021-08-08 NOTE — ED Triage Notes (Signed)
Patient was at work. A metal pan sliced her in between her left thumb and pointer finger. Unknown when last tetanus shot was.  ?
